# Patient Record
Sex: Female | Born: 1975 | Hispanic: Yes | Marital: Married | Smoking: Never smoker
Health system: Southern US, Community
[De-identification: ages and names within clinical notes are randomized; demographics above are authoritative.]

## PROBLEM LIST (undated history)

## (undated) DIAGNOSIS — Z923 Personal history of irradiation: Secondary | ICD-10-CM

## (undated) DIAGNOSIS — C50919 Malignant neoplasm of unspecified site of unspecified female breast: Secondary | ICD-10-CM

## (undated) DIAGNOSIS — F419 Anxiety disorder, unspecified: Secondary | ICD-10-CM

## (undated) DIAGNOSIS — Z8739 Personal history of other diseases of the musculoskeletal system and connective tissue: Secondary | ICD-10-CM

## (undated) DIAGNOSIS — Z803 Family history of malignant neoplasm of breast: Secondary | ICD-10-CM

## (undated) DIAGNOSIS — I499 Cardiac arrhythmia, unspecified: Secondary | ICD-10-CM

## (undated) DIAGNOSIS — I10 Essential (primary) hypertension: Secondary | ICD-10-CM

## (undated) DIAGNOSIS — D259 Leiomyoma of uterus, unspecified: Secondary | ICD-10-CM

## (undated) DIAGNOSIS — R112 Nausea with vomiting, unspecified: Secondary | ICD-10-CM

## (undated) DIAGNOSIS — N3010 Interstitial cystitis (chronic) without hematuria: Secondary | ICD-10-CM

## (undated) DIAGNOSIS — M797 Fibromyalgia: Secondary | ICD-10-CM

## (undated) DIAGNOSIS — K219 Gastro-esophageal reflux disease without esophagitis: Secondary | ICD-10-CM

## (undated) DIAGNOSIS — R7303 Prediabetes: Secondary | ICD-10-CM

## (undated) DIAGNOSIS — F32A Depression, unspecified: Secondary | ICD-10-CM

## (undated) DIAGNOSIS — Z9221 Personal history of antineoplastic chemotherapy: Secondary | ICD-10-CM

## (undated) DIAGNOSIS — Z8042 Family history of malignant neoplasm of prostate: Secondary | ICD-10-CM

## (undated) DIAGNOSIS — Z9889 Other specified postprocedural states: Secondary | ICD-10-CM

## (undated) DIAGNOSIS — M1990 Unspecified osteoarthritis, unspecified site: Secondary | ICD-10-CM

## (undated) DIAGNOSIS — G43909 Migraine, unspecified, not intractable, without status migrainosus: Secondary | ICD-10-CM

## (undated) DIAGNOSIS — D369 Benign neoplasm, unspecified site: Secondary | ICD-10-CM

## (undated) DIAGNOSIS — Z806 Family history of leukemia: Secondary | ICD-10-CM

## (undated) DIAGNOSIS — Z800 Family history of malignant neoplasm of digestive organs: Secondary | ICD-10-CM

## (undated) HISTORY — DX: Migraine, unspecified, not intractable, without status migrainosus: G43.909

## (undated) HISTORY — DX: Family history of malignant neoplasm of breast: Z80.3

## (undated) HISTORY — DX: Benign neoplasm, unspecified site: D36.9

## (undated) HISTORY — DX: Cardiac arrhythmia, unspecified: I49.9

## (undated) HISTORY — DX: Fibromyalgia: M79.7

## (undated) HISTORY — DX: Leiomyoma of uterus, unspecified: D25.9

## (undated) HISTORY — DX: Personal history of other diseases of the musculoskeletal system and connective tissue: Z87.39

## (undated) HISTORY — DX: Interstitial cystitis (chronic) without hematuria: N30.10

## (undated) HISTORY — DX: Gastro-esophageal reflux disease without esophagitis: K21.9

## (undated) HISTORY — DX: Family history of malignant neoplasm of digestive organs: Z80.0

## (undated) HISTORY — DX: Family history of malignant neoplasm of prostate: Z80.42

## (undated) HISTORY — DX: Depression, unspecified: F32.A

## (undated) HISTORY — DX: Family history of leukemia: Z80.6

## (undated) HISTORY — DX: Anxiety disorder, unspecified: F41.9

## (undated) HISTORY — PX: CHOLECYSTECTOMY: SHX55

## (undated) HISTORY — DX: Unspecified osteoarthritis, unspecified site: M19.90

## (undated) HISTORY — DX: Essential (primary) hypertension: I10

---

## 2020-08-20 LAB — HM MAMMOGRAPHY

## 2020-09-03 ENCOUNTER — Other Ambulatory Visit: Payer: Self-pay | Admitting: Obstetrics and Gynecology

## 2020-09-03 DIAGNOSIS — N6320 Unspecified lump in the left breast, unspecified quadrant: Secondary | ICD-10-CM

## 2020-09-23 ENCOUNTER — Other Ambulatory Visit: Payer: Self-pay | Admitting: Obstetrics and Gynecology

## 2020-09-23 ENCOUNTER — Ambulatory Visit
Admission: RE | Admit: 2020-09-23 | Discharge: 2020-09-23 | Disposition: A | Discharge status: Home / Self Care | Payer: 59 | Source: Ambulatory Visit | Attending: Obstetrics and Gynecology | Admitting: Obstetrics and Gynecology

## 2020-09-23 ENCOUNTER — Other Ambulatory Visit: Payer: Self-pay

## 2020-09-23 DIAGNOSIS — N6320 Unspecified lump in the left breast, unspecified quadrant: Secondary | ICD-10-CM

## 2020-09-23 IMAGING — US US BREAST*L* LIMITED INC AXILLA
1 series · 13 of 24 positions shown · non-contrast
Comparison: Previous exam(s).

CLINICAL DATA: 44-year-old female presenting as a recall from
screening for possible left breast mass and calcifications.

EXAM:
DIGITAL DIAGNOSTIC UNILATERAL LEFT MAMMOGRAM WITH TOMOSYNTHESIS AND
CAD; ULTRASOUND LEFT BREAST LIMITED
TECHNIQUE: Left digital diagnostic mammography and breast tomosynthesis was
performed. The images were evaluated with computer-aided detection.;
Targeted ultrasound examination of the left breast was performed

[Series 1: us breast*left* limited inc axilla · 0.06mm/px · 13 of 24 slices shown]
[im 1/24]
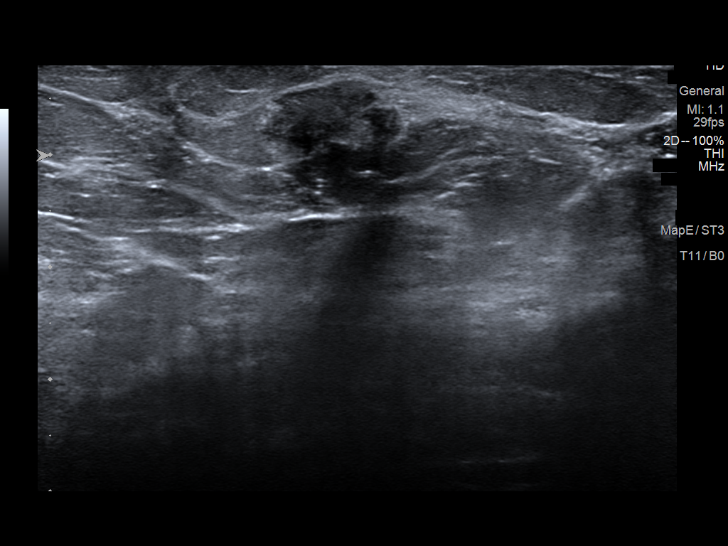
[im 3/24]
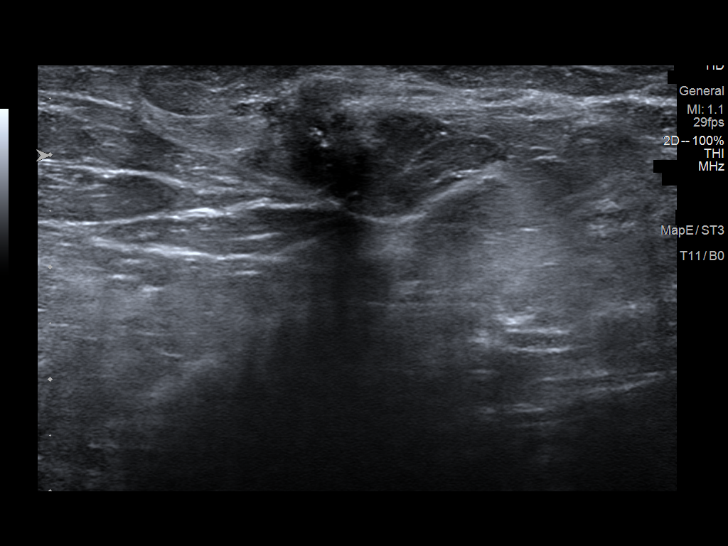
[im 5/24]
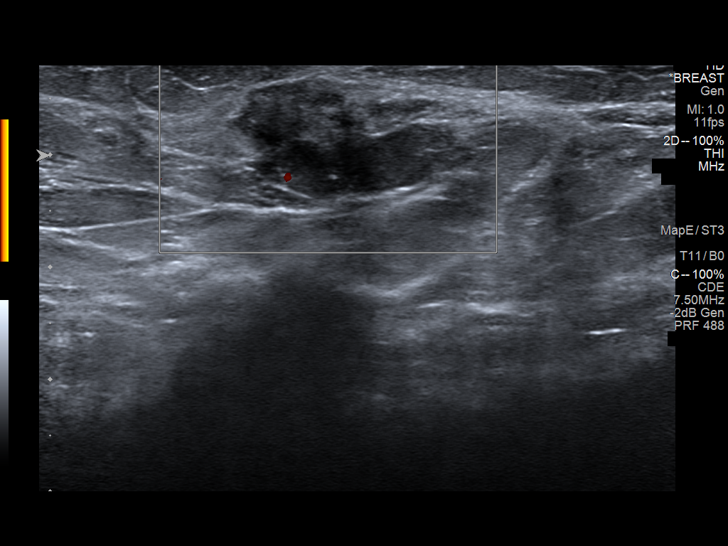
[im 7/24]
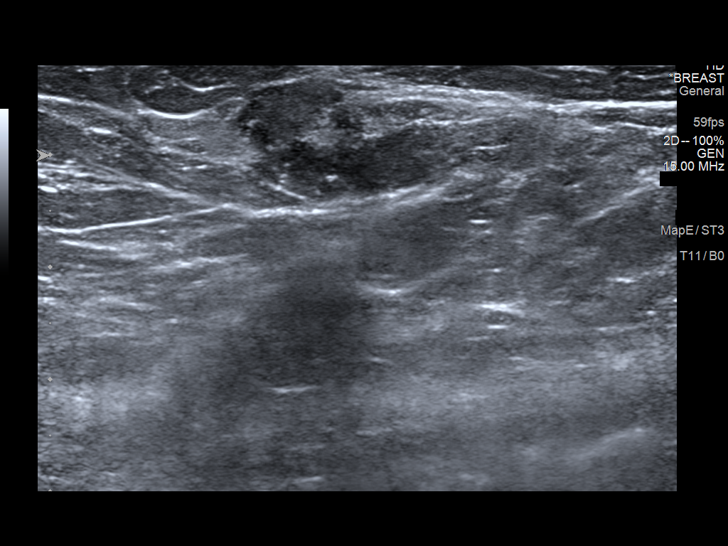
[im 9/24]
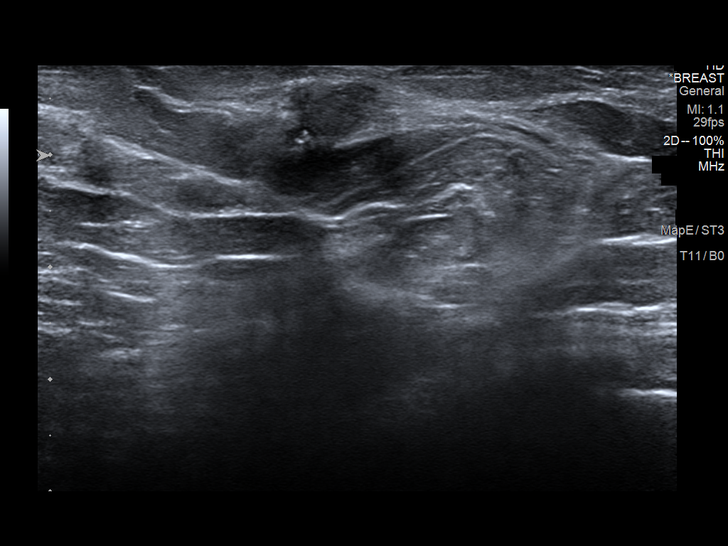
[im 11/24]
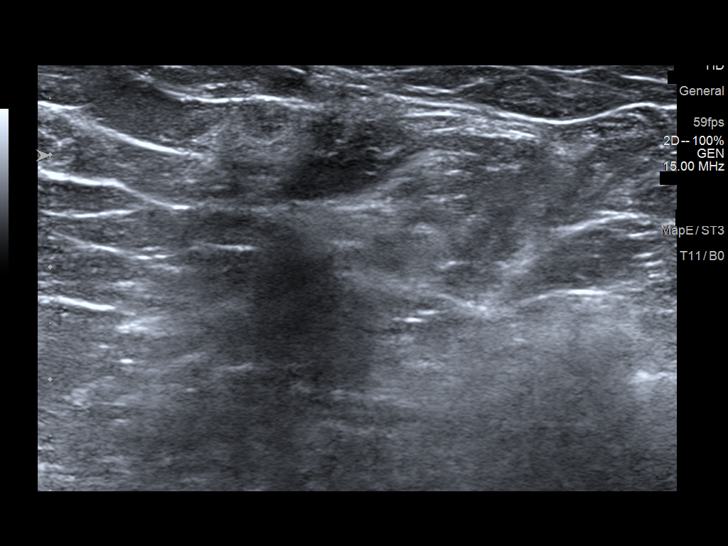
[im 13/24]
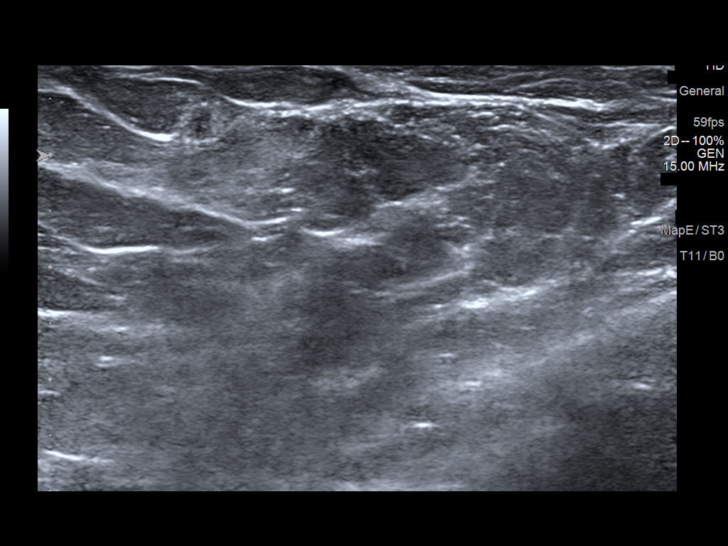
[im 14/24]
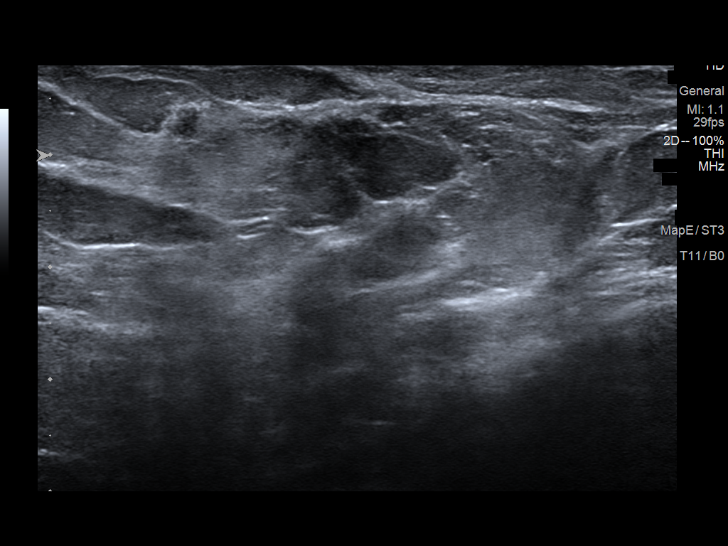
[im 16/24]
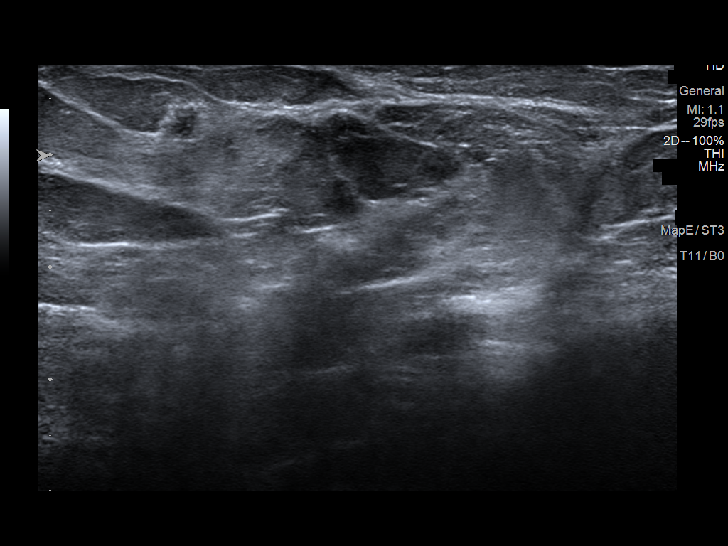
[im 18/24]
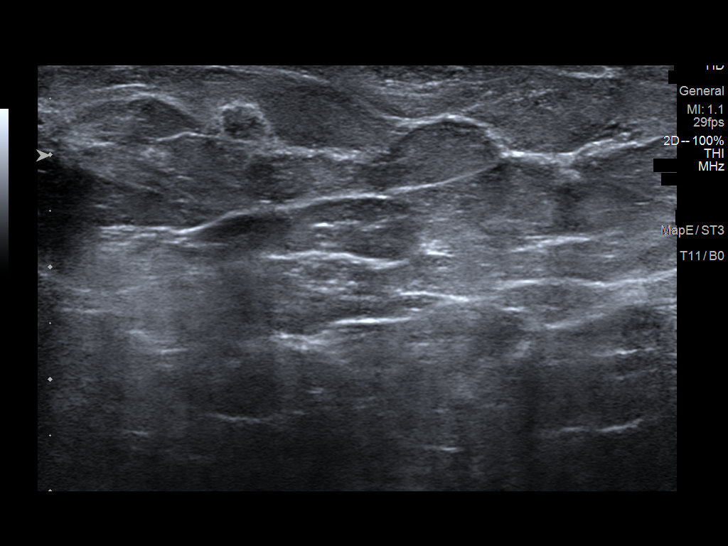
[im 20/24]
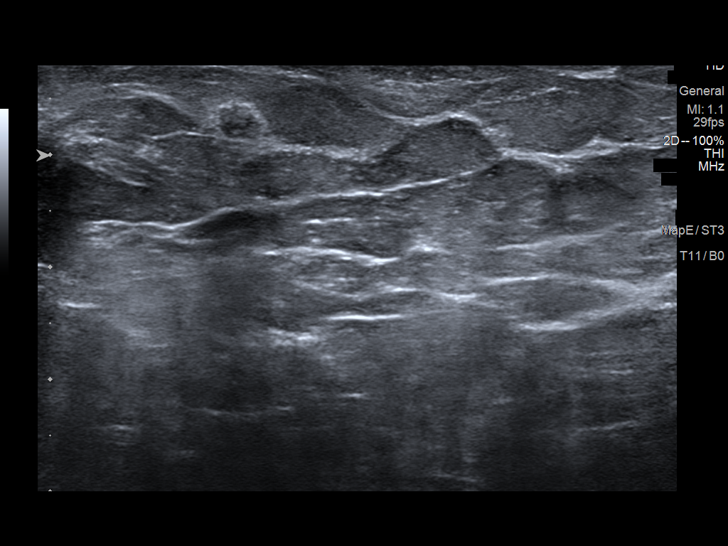
[im 22/24]
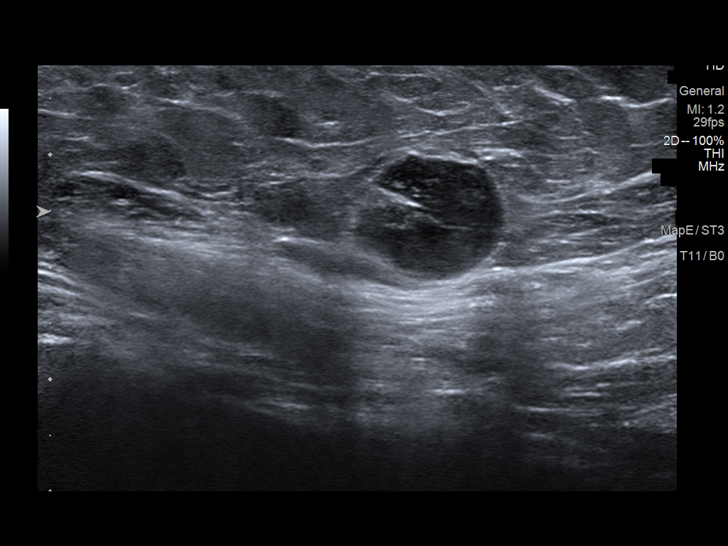
[im 24/24]
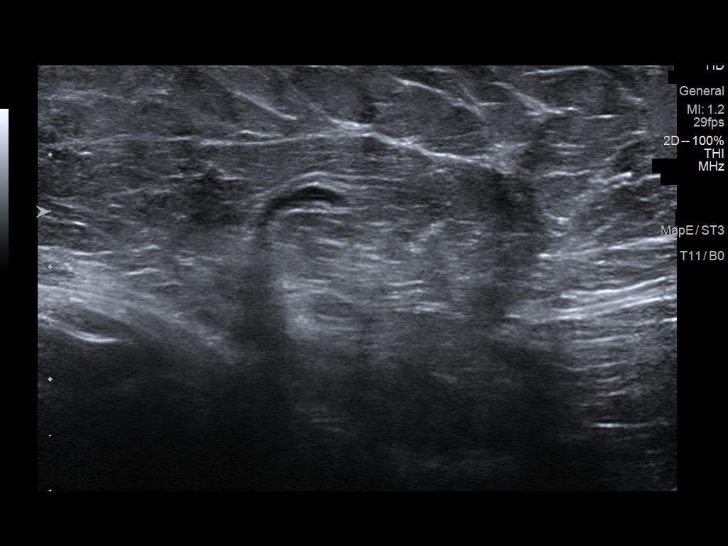

[13 of 24 positions shown; findings below may reference images not displayed]

ACR Breast Density Category b: There are scattered areas of
fibroglandular density.
FINDINGS: Mammogram:

Spot compression tomosynthesis and spot 2D magnification views of
the left breast were performed. There is an irregular mass in the
lower outer left breast measuring approximately 1.6 cm with
associated pleomorphic calcification. There is an additional smaller
irregular mass anterior to this measuring approximately 0.4 cm.
There are faint calcifications between the 2 masses overall spanning
approximately 2.3 cm.

Ultrasound:

Targeted ultrasound performed in the left breast at 5 o'clock 7 cm
from the nipple demonstrating an irregular hypoechoic mass measuring
1.7 x 1.1 x 1.3 cm. There is an additional smaller mass at 5 o'clock
7 cm from nipple measuring 0.5 x 0.4 x 0.3 cm. These correspond to
the mammographic findings.

Targeted ultrasound of the left axilla demonstrates an abnormal
lymph node with cortical thickness measuring 0.4 cm.
IMPRESSION: 1. There are two indeterminate masses in the left breast at 5
o'clock measuring 1.7 and 0.5 cm. There are suspicious
calcifications associated with an between the two masses overall
spanning 2.3 cm.

2.  Abnormal left axillary lymph node.

RECOMMENDATION:
Ultrasound-guided core needle biopsy x 3. Biopsy of both masses at 5
o'clock as well as of the abnormal left axillary lymph node.

I have discussed the findings and recommendations with the patient.
If applicable, a reminder letter will be sent to the patient
regarding the next appointment.

BI-RADS CATEGORY  4: Suspicious.

## 2020-09-23 IMAGING — MG MM DIGITAL DIAGNOSTIC UNILAT*L* W/ TOMO W/ CAD
6 series · 6 of 14 positions shown · non-contrast
Comparison: Previous exam(s).

CLINICAL DATA: 44-year-old female presenting as a recall from
screening for possible left breast mass and calcifications.

EXAM:
DIGITAL DIAGNOSTIC UNILATERAL LEFT MAMMOGRAM WITH TOMOSYNTHESIS AND
CAD; ULTRASOUND LEFT BREAST LIMITED
TECHNIQUE: Left digital diagnostic mammography and breast tomosynthesis was
performed. The images were evaluated with computer-aided detection.;
Targeted ultrasound examination of the left breast was performed

[L CC]
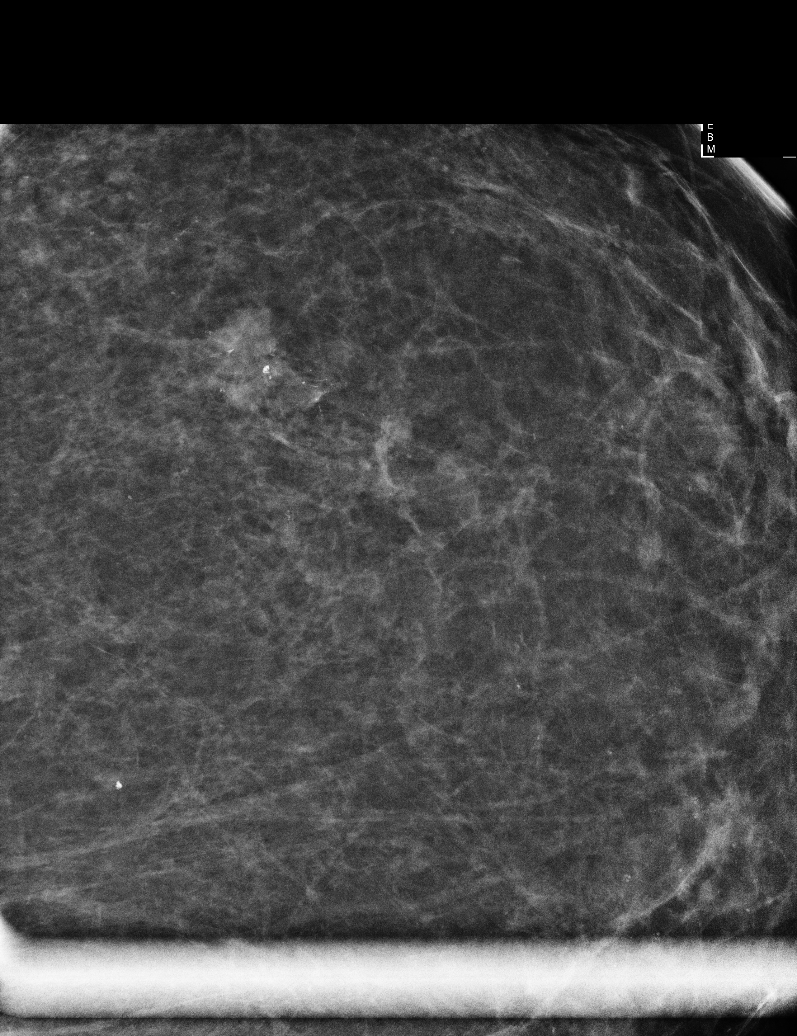

[L ML]
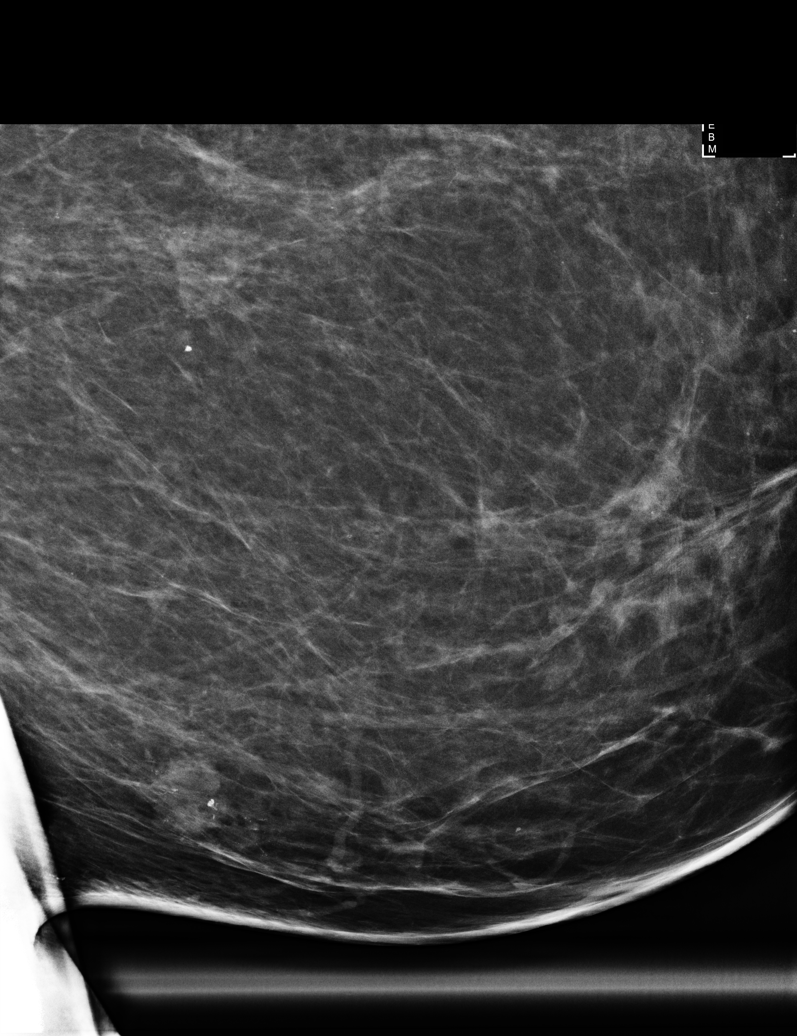

[L CC synth-2D]
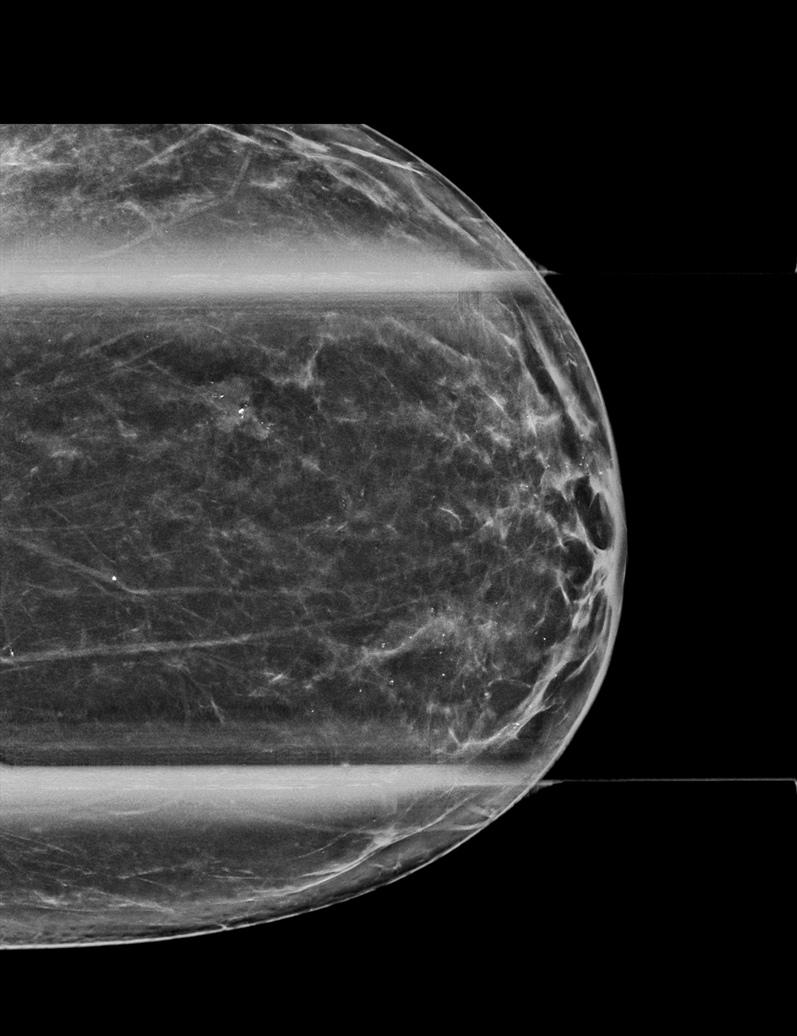

[L MLO synth-2D]
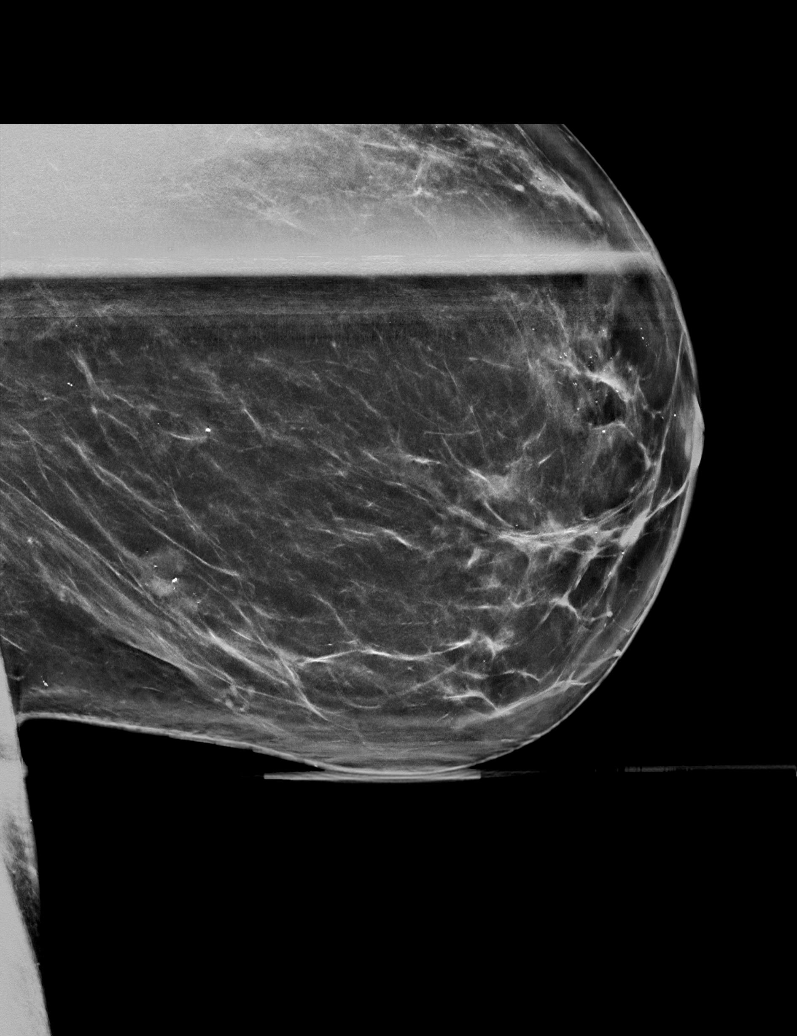

[L MLO tomo · tomo slice 41/80.0]
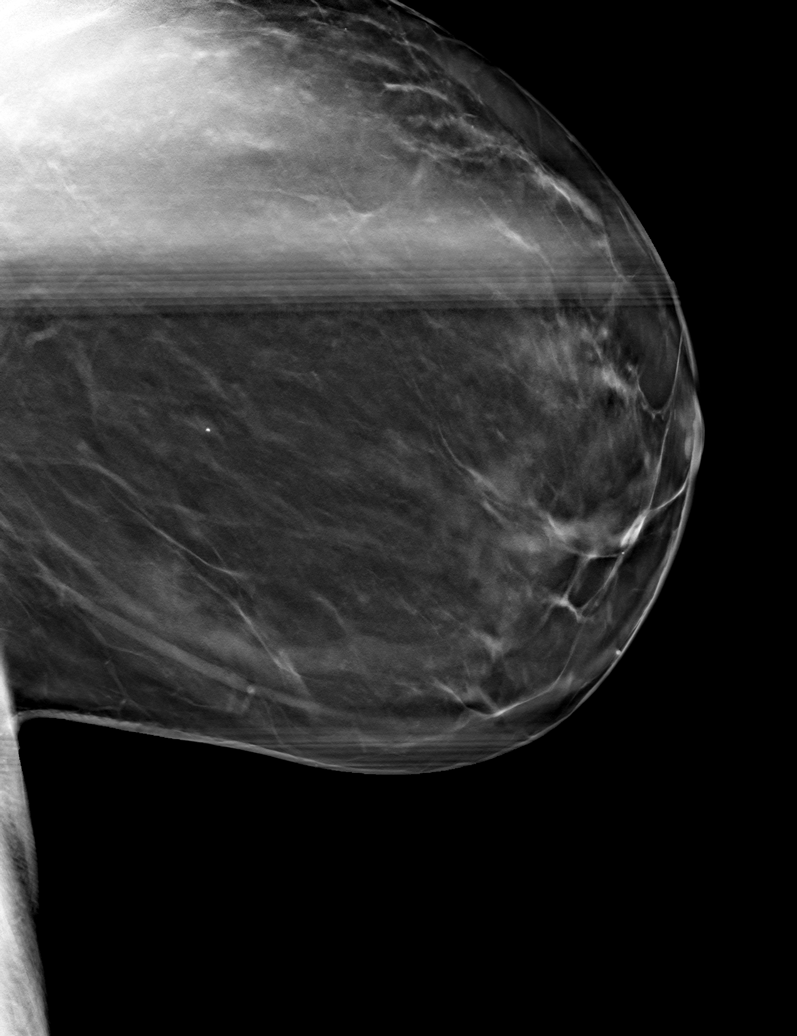

[L CC tomo · tomo slice 35/68.0]
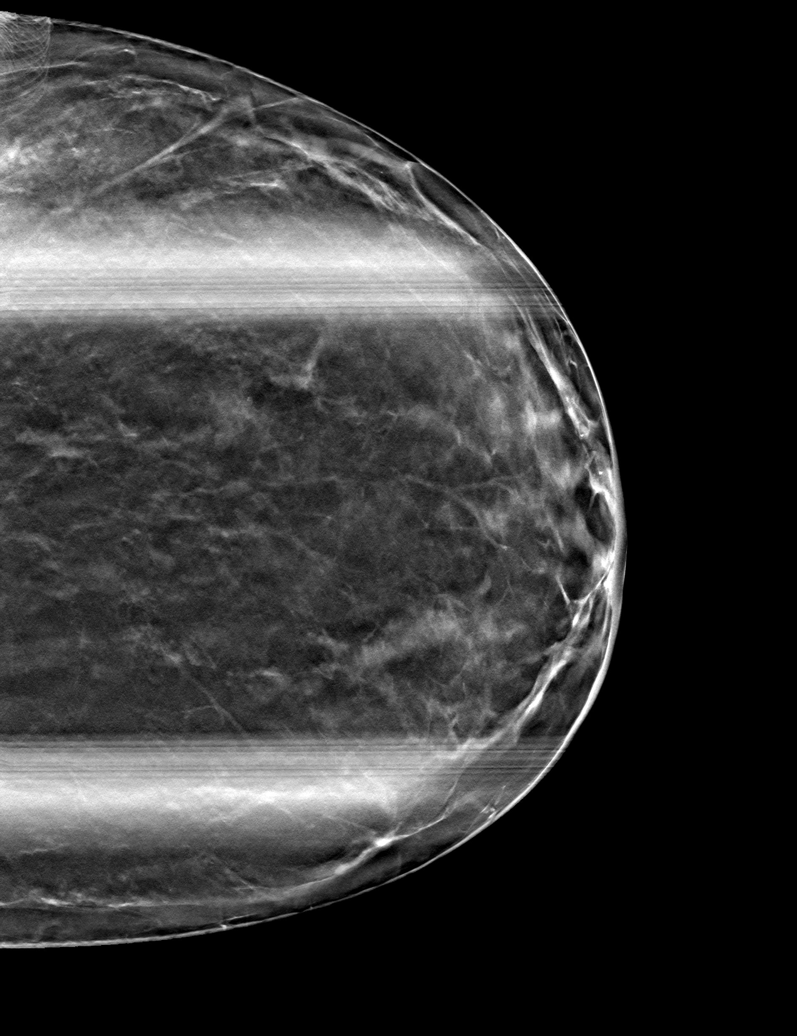

[6 of 14 positions shown; findings below may reference images not displayed]

ACR Breast Density Category b: There are scattered areas of
fibroglandular density.
FINDINGS: Mammogram:

Spot compression tomosynthesis and spot 2D magnification views of
the left breast were performed. There is an irregular mass in the
lower outer left breast measuring approximately 1.6 cm with
associated pleomorphic calcification. There is an additional smaller
irregular mass anterior to this measuring approximately 0.4 cm.
There are faint calcifications between the 2 masses overall spanning
approximately 2.3 cm.

Ultrasound:

Targeted ultrasound performed in the left breast at 5 o'clock 7 cm
from the nipple demonstrating an irregular hypoechoic mass measuring
1.7 x 1.1 x 1.3 cm. There is an additional smaller mass at 5 o'clock
7 cm from nipple measuring 0.5 x 0.4 x 0.3 cm. These correspond to
the mammographic findings.

Targeted ultrasound of the left axilla demonstrates an abnormal
lymph node with cortical thickness measuring 0.4 cm.
IMPRESSION: 1. There are two indeterminate masses in the left breast at 5
o'clock measuring 1.7 and 0.5 cm. There are suspicious
calcifications associated with an between the two masses overall
spanning 2.3 cm.

2.  Abnormal left axillary lymph node.

RECOMMENDATION:
Ultrasound-guided core needle biopsy x 3. Biopsy of both masses at 5
o'clock as well as of the abnormal left axillary lymph node.

I have discussed the findings and recommendations with the patient.
If applicable, a reminder letter will be sent to the patient
regarding the next appointment.

BI-RADS CATEGORY  4: Suspicious.

## 2020-10-01 ENCOUNTER — Ambulatory Visit
Admission: RE | Admit: 2020-10-01 | Discharge: 2020-10-01 | Disposition: A | Discharge status: Home / Self Care | Payer: 59 | Source: Ambulatory Visit | Attending: Obstetrics and Gynecology | Admitting: Obstetrics and Gynecology

## 2020-10-01 ENCOUNTER — Other Ambulatory Visit: Payer: Self-pay

## 2020-10-01 DIAGNOSIS — N6320 Unspecified lump in the left breast, unspecified quadrant: Secondary | ICD-10-CM

## 2020-10-01 IMAGING — MG MM BREAST LOCALIZATION CLIP
3 series · 3 of 3 positions shown · non-contrast
Comparison: Previous exam(s).

CLINICAL DATA: Patient status post ultrasound-guided biopsy two
adjacent left breast masses and cortically thickened left axillary
lymph node

EXAM:
DIAGNOSTIC LEFT MAMMOGRAM POST ULTRASOUND BIOPSY

[L ML]
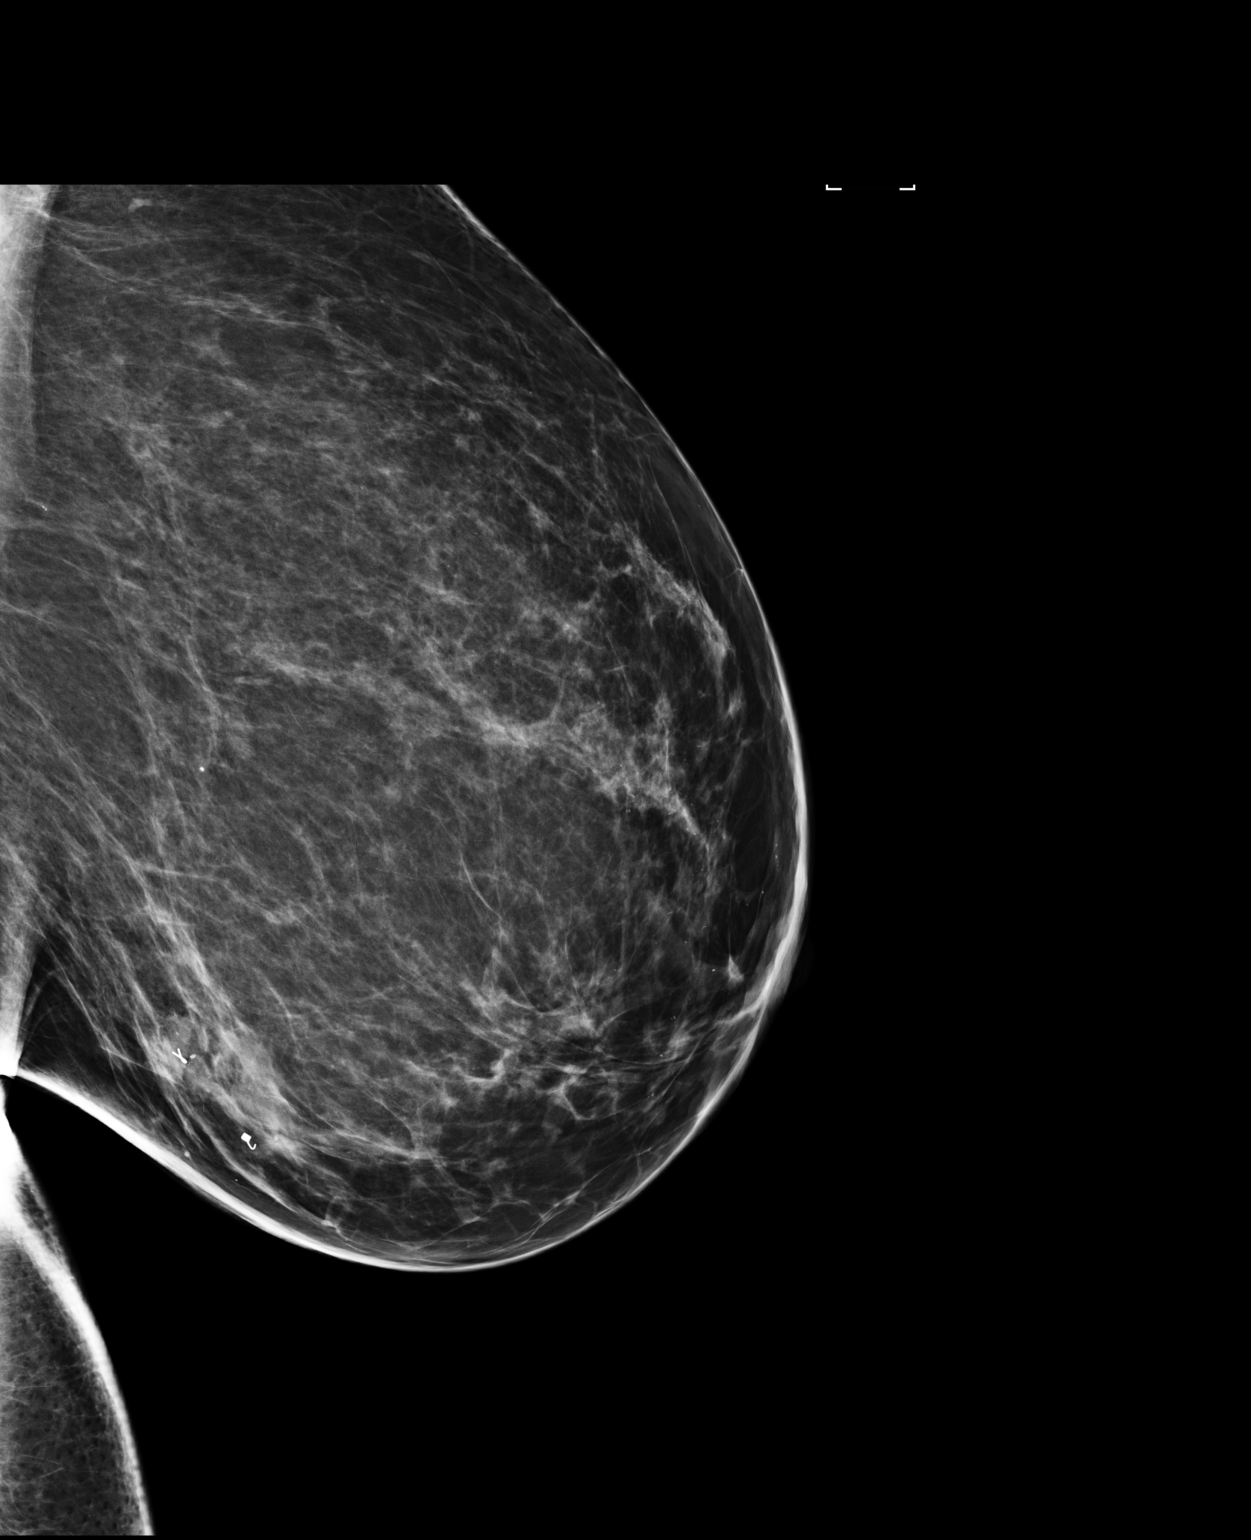

[L CC]
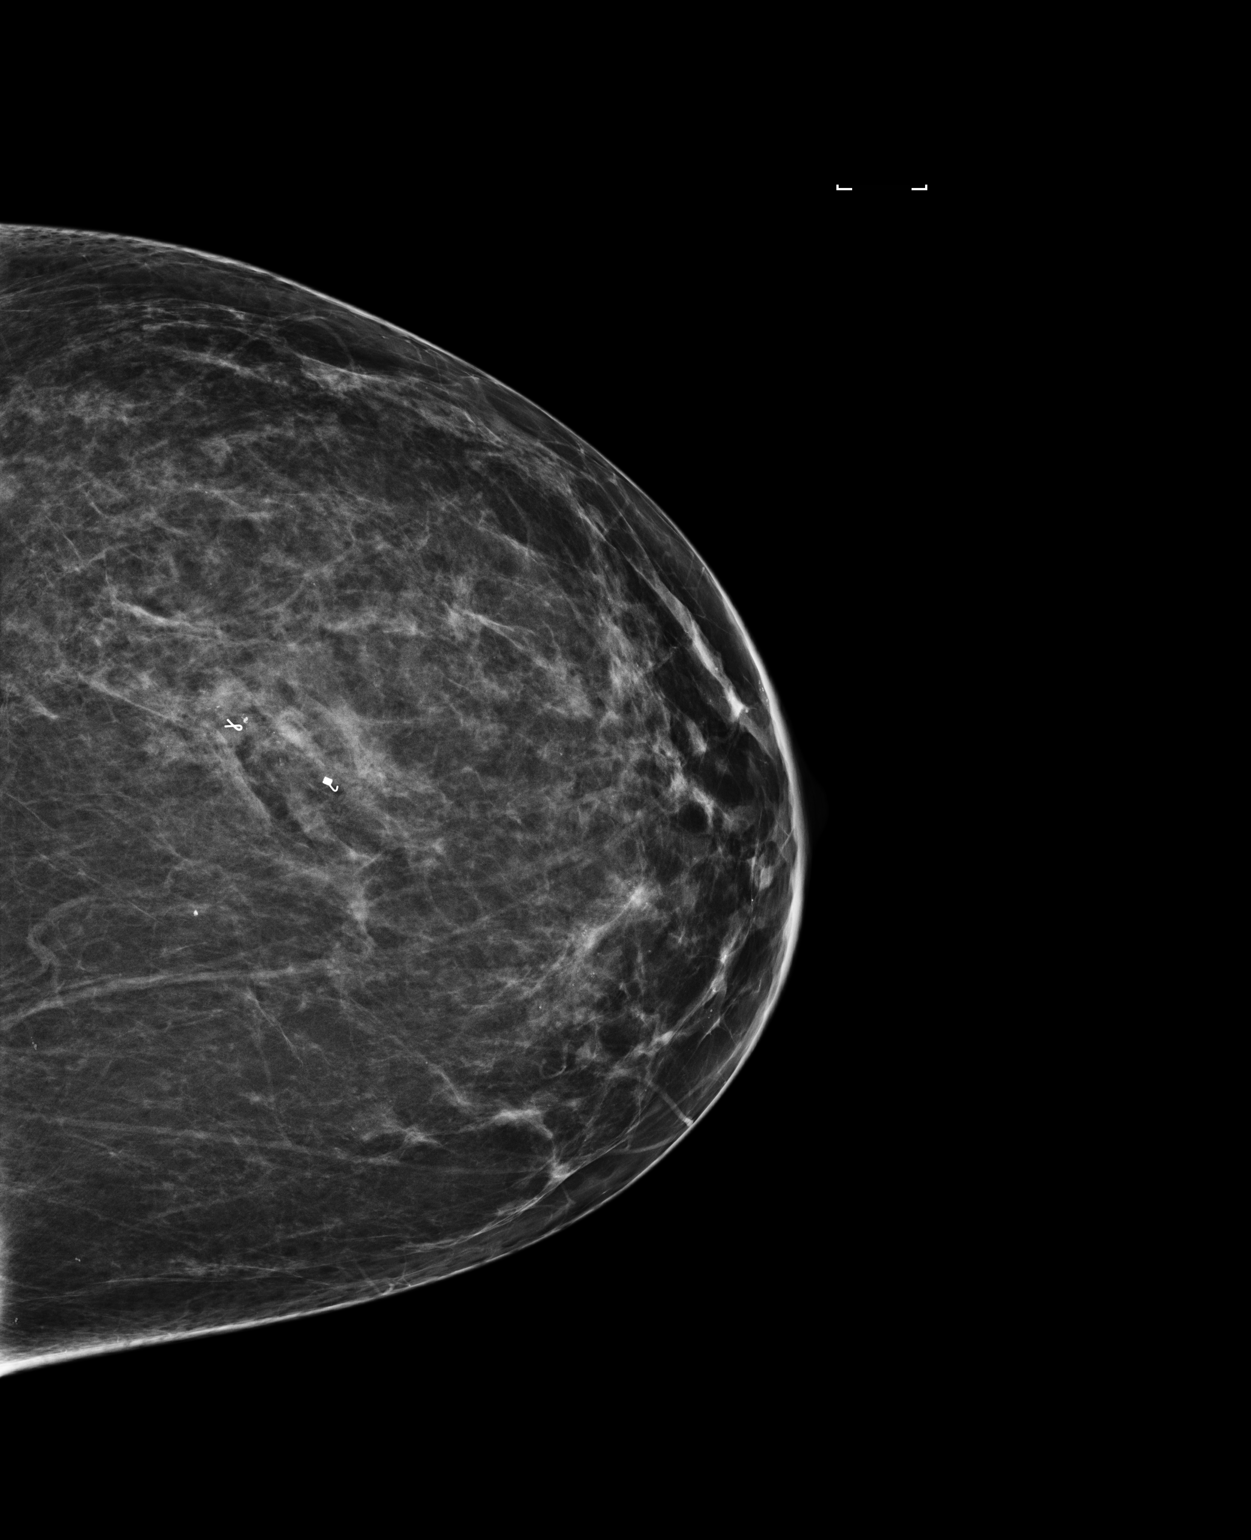

[L MLO]
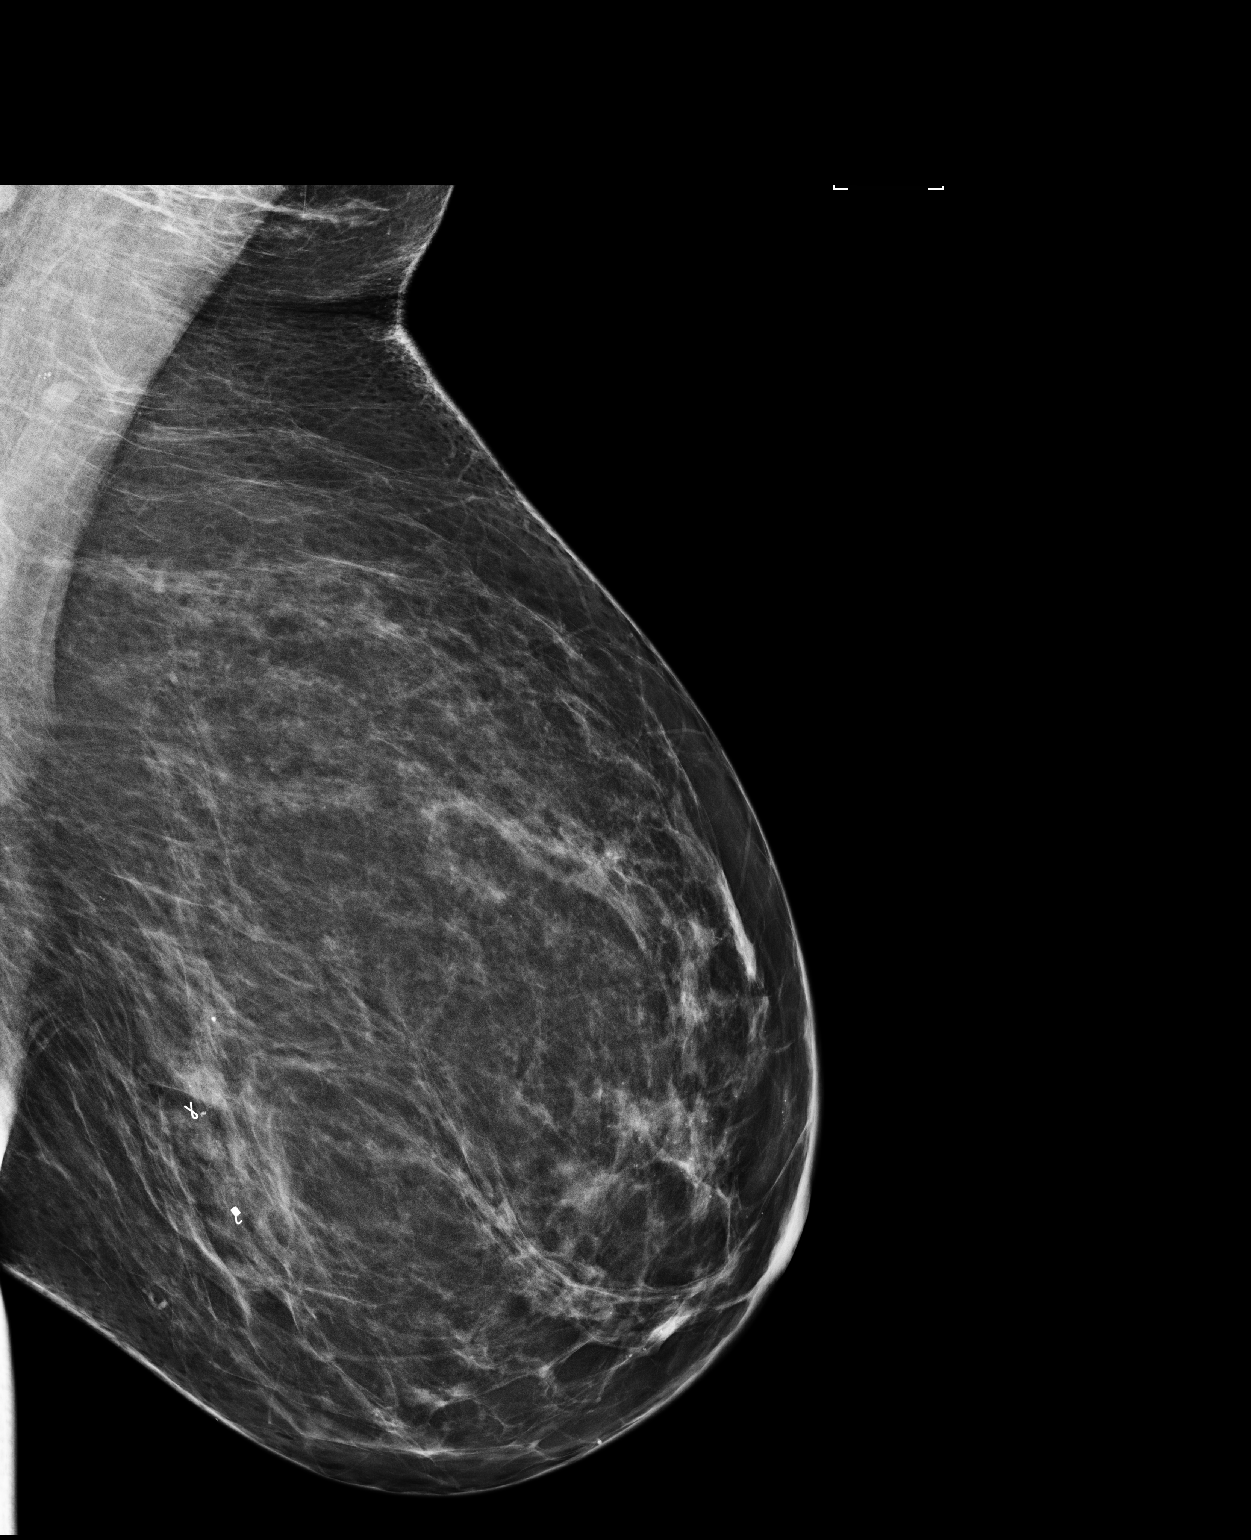

[3 of 3 positions shown; findings below may reference images not displayed]

FINDINGS: Mammographic images were obtained following ultrasound guided biopsy
of 2 adjacent breast masses and thickened left axillary lymph.

Site 1: Left breast mass 5 o'clock position posterior: Ribbon shaped
clip: In appropriate position.

Site 2: Left breast mass 5 o'clock position anterior: Coil shaped
clip: In appropriate position.

Site 3: Left axillary lymph node: Tribell clip: In appropriate
position.
IMPRESSION: Appropriate positioning of the biopsy marking clips as above.

Final Assessment: Post Procedure Mammograms for Marker Placement

## 2020-10-01 IMAGING — US US BREAST BX W LOC DEV 1ST LESION IMG BX SPEC US GUIDE*L*
1 series · 15 of 19 positions shown · non-contrast
Comparison: Previous exam(s).
COMPARISON: Previous exam(s).

Addendum:
CLINICAL DATA: Patient with two indeterminate left breast masses
and thickened left axillary lymph node.

EXAM:
ULTRASOUND GUIDED LEFT BREAST CORE NEEDLE BIOPSY
US AXILLARY NODE CORE BIOPSY LEFT

[Series 1: us breast bx w loc dev 1st lesion img bx spec us g · 0.06mm/px · 15 of 19 slices shown]
[im 1/19]
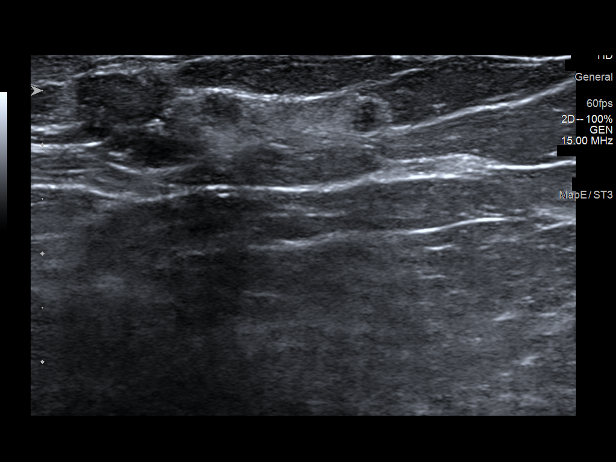
[im 2/19]
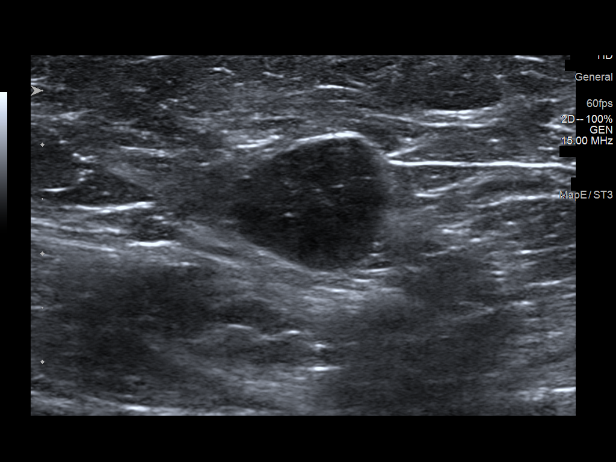
[im 4/19]
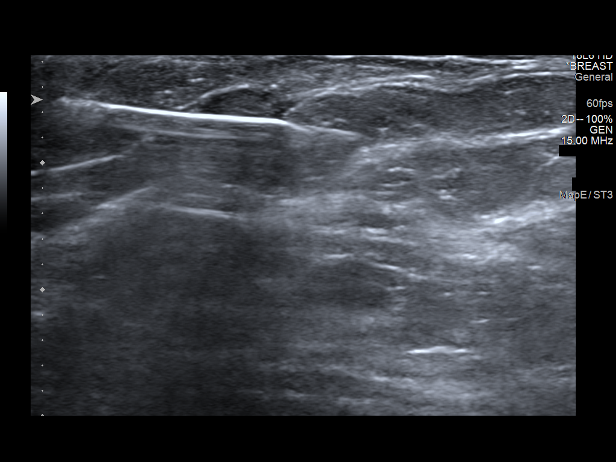
[im 5/19]
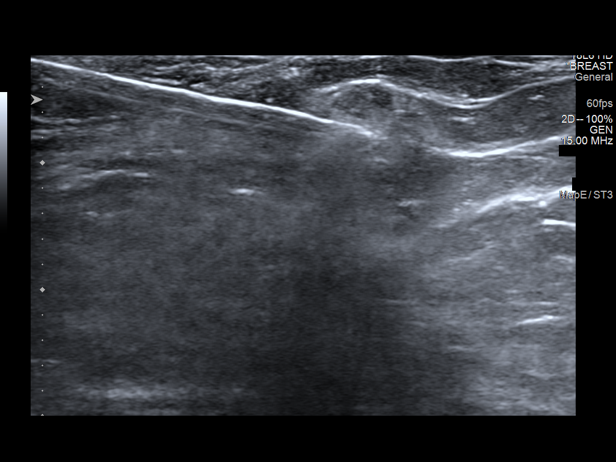
[im 6/19]
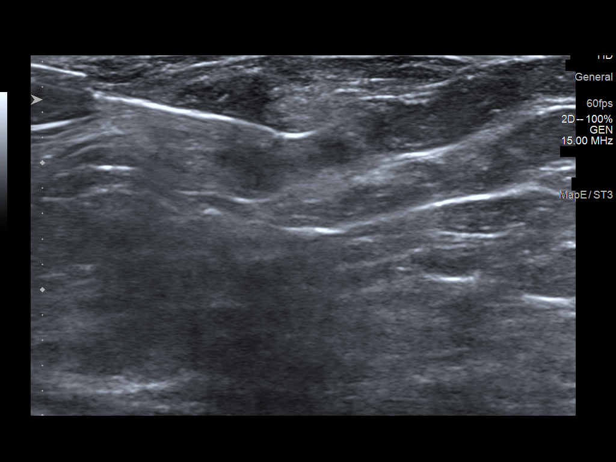
[im 7/19]
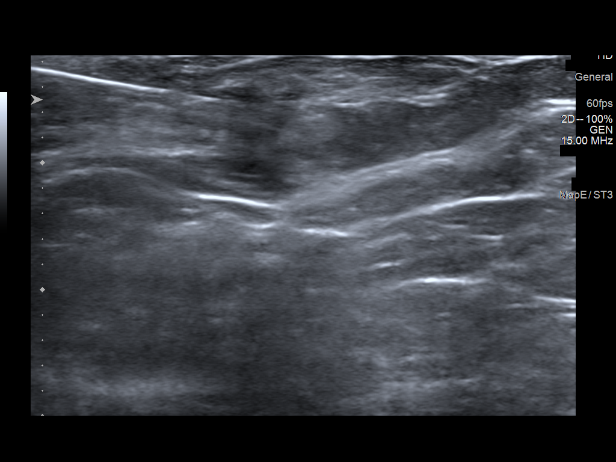
[im 9/19]
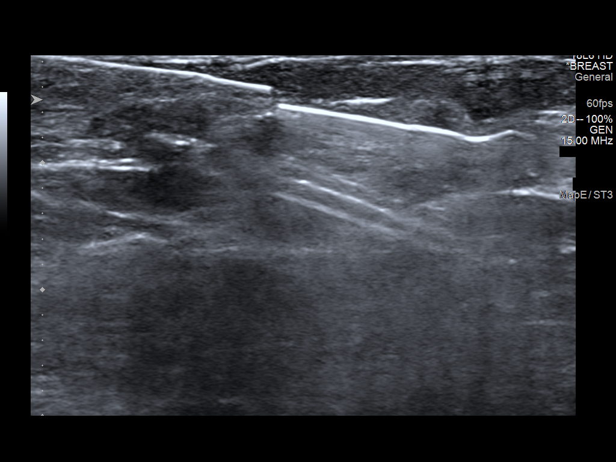
[im 10/19]
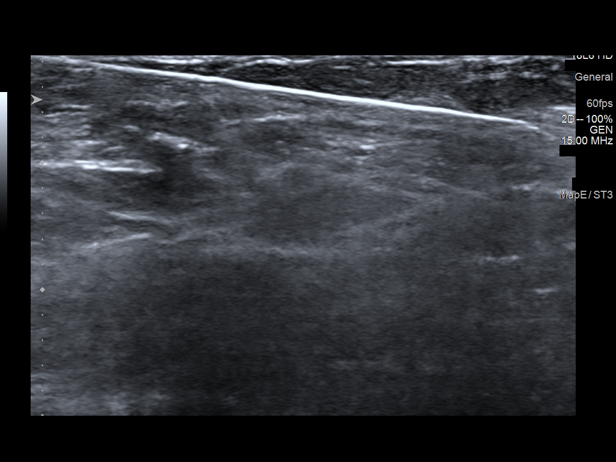
[im 11/19]
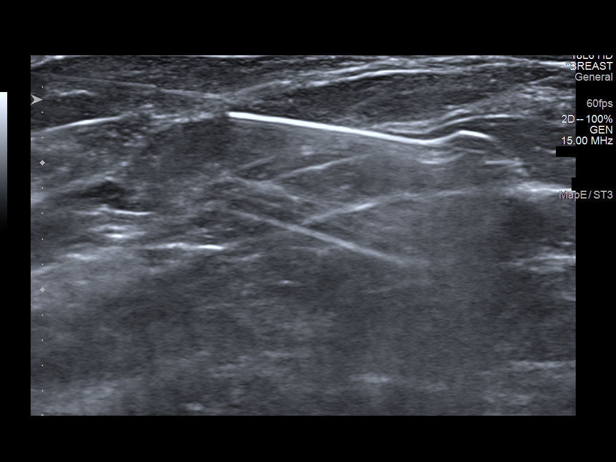
[im 13/19]
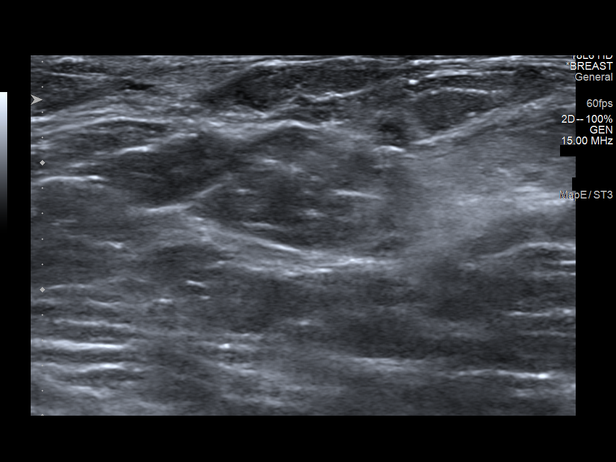
[im 14/19]
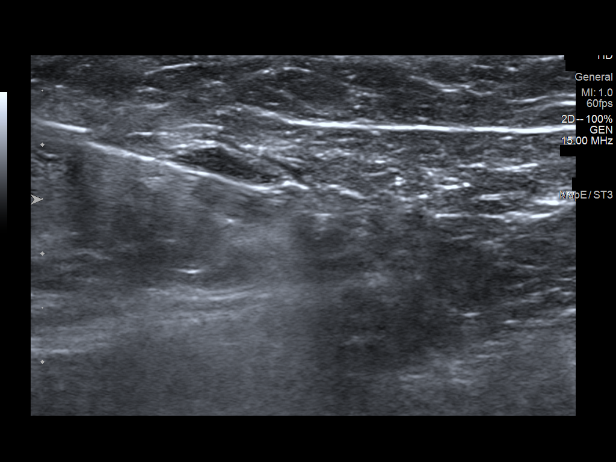
[im 15/19]
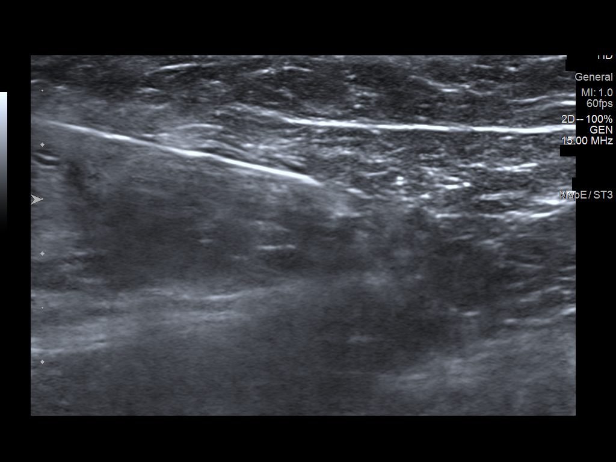
[im 16/19]
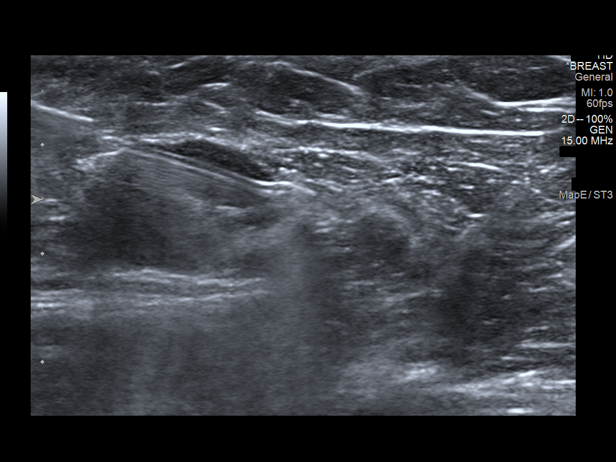
[im 18/19]
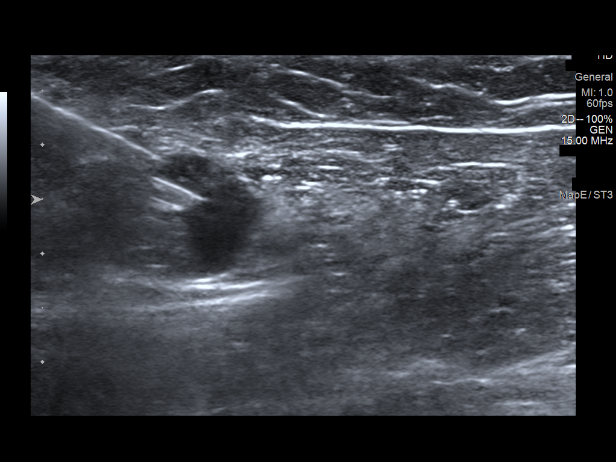
[im 19/19]
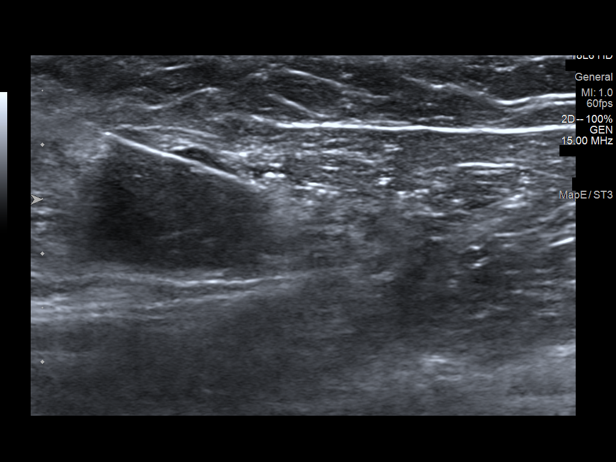

[15 of 19 positions shown; findings below may reference images not displayed]



Site 1: Left breast 5 o'clock position posterior

Lesion quadrant: Lower outer quadrant

Using sterile technique and 1% Lidocaine as local anesthetic, under
direct ultrasound visualization, a 14 gauge GARGI device was
used to perform biopsy of left breast mass 5 o'clock position
posterior using a lateral approach. At the conclusion of the
procedure ribbon shaped tissue marker clip was deployed into the
biopsy cavity. Follow up 2 view mammogram was performed and dictated
separately.

Site 2: Left breast 5 o'clock position anterior

Lesion quadrant: Lower outer quadrant

Using sterile technique and 1% Lidocaine as local anesthetic, under
direct ultrasound visualization, a 14 gauge GARGI device was
used to perform biopsy of left breast 5 o'clock position anterior
using a lateral approach. At the conclusion of the procedure coil
tissue marker clip was deployed into the biopsy cavity. Follow up 2
view mammogram was performed and dictated separately.

Site 3: Left axilla

Using sterile technique and 1% Lidocaine as local anesthetic, under
direct ultrasound visualization, a 14 gauge GARGI device was
used to perform biopsy of left axillary lymph node using a lateral
approach. At the conclusion of the procedure Tribell tissue marker
clip was deployed into the biopsy cavity. Follow up 2 view mammogram
was performed and dictated separately.
IMPRESSION: Ultrasound guided biopsy of two adjacent left breast masses and
cortically thickened left axillary lymph node. No apparent
complications.

ADDENDUM:
Pathology revealed GRADE III INVASIVE DUCTAL CARCINOMA, DUCTAL
CARCINOMA IN SITU of the Left breast, 5 o'clock, posterior. This was
found to be concordant by Dr. GARGI.

Pathology revealed GRADE III INVASIVE DUCTAL CARCINOMA, DUCTAL
CARCINOMA IN SITU of the Left breast, 5 o'clock, anterior. This was
found to be concordant by Dr. GARGI.

Pathology revealed METASTATIC CARCINOMA IN A LYMPH NODE of the Left
axilla. This was found to be concordant by Dr. GARGI.

Pathology results were discussed with the patient by telephone by
GARGI, Bilingual Patient Services Representative and
GARGI, RN Nurse Navigator. The patient reported doing well
after the biopsies with tenderness at the sites and a tender,
partially numb/tingling Left arm. The patient stated she had full
range of motion in her Left arm. Post biopsy instructions and care
were reviewed and questions were answered. The patient was
encouraged to call The [REDACTED] for any
additional concerns.

The patient was referred to [REDACTED]
[REDACTED] at [REDACTED] on
[DATE].

Pathology results reported by GARGI, RN on [DATE].



Site 1: Left breast 5 o'clock position posterior

Lesion quadrant: Lower outer quadrant

Using sterile technique and 1% Lidocaine as local anesthetic, under
direct ultrasound visualization, a 14 gauge GARGI device was
used to perform biopsy of left breast mass 5 o'clock position
posterior using a lateral approach. At the conclusion of the
procedure ribbon shaped tissue marker clip was deployed into the
biopsy cavity. Follow up 2 view mammogram was performed and dictated
separately.

Site 2: Left breast 5 o'clock position anterior

Lesion quadrant: Lower outer quadrant

Using sterile technique and 1% Lidocaine as local anesthetic, under
direct ultrasound visualization, a 14 gauge GARGI device was
used to perform biopsy of left breast 5 o'clock position anterior
using a lateral approach. At the conclusion of the procedure coil
tissue marker clip was deployed into the biopsy cavity. Follow up 2
view mammogram was performed and dictated separately.

Site 3: Left axilla

Using sterile technique and 1% Lidocaine as local anesthetic, under
direct ultrasound visualization, a 14 gauge GARGI device was
used to perform biopsy of left axillary lymph node using a lateral
approach. At the conclusion of the procedure Tribell tissue marker
clip was deployed into the biopsy cavity. Follow up 2 view mammogram
was performed and dictated separately.
IMPRESSION: Ultrasound guided biopsy of two adjacent left breast masses and
cortically thickened left axillary lymph node. No apparent
complications.

## 2020-10-03 ENCOUNTER — Telehealth: Payer: Self-pay | Admitting: Hematology and Oncology

## 2020-10-03 ENCOUNTER — Encounter: Payer: Self-pay | Admitting: *Deleted

## 2020-10-03 DIAGNOSIS — C50512 Malignant neoplasm of lower-outer quadrant of left female breast: Secondary | ICD-10-CM

## 2020-10-03 DIAGNOSIS — Z170 Estrogen receptor positive status [ER+]: Secondary | ICD-10-CM | POA: Insufficient documentation

## 2020-10-03 NOTE — Telephone Encounter (Signed)
Spoke to patient to confirm PM clinic for 4/27, packet emailed to patient

## 2020-10-08 NOTE — Progress Notes (Signed)
[place] NOTE  Patient Care Team: [name], FNP as PCP - General (Family Medicine) Rockwell Germany, RN as Oncology Nurse Navigator Mauro Kaufmann, RN as Oncology Nurse Navigator Donnie Mesa, MD as Consulting Physician (General Surgery) Nicholas Lose, MD as Consulting Physician (Hematology and Oncology) Gery Pray, MD as Consulting Physician (Radiation Oncology)  CHIEF COMPLAINTS/PURPOSE OF CONSULTATION:  Newly diagnosed breast cancer  HISTORY OF PRESENTING ILLNESS:  Tammie [name] 45 y.o. female is here because of recent diagnosis of invasive ductal carcinoma of the left breast. Screening mammogram showed a left breast mass and calcifications. Diagnostic mammogram and [place] on 09/23/20 showed a 1.7cm and 0.5cm mass at the 5 o'clock position in the left breast, with one 0.4cm abnormal right axillary lymph node. Biopsy on 10/01/20 showed invasive and in situ ductal carcinoma, grade 3, HER-2 positive (3+), ER+ 5% weak, PR+ 1%, Ki67 20%. She presents to the clinic today for initial evaluation and discussion of treatment options.   I reviewed her records extensively and collaborated the history with the patient.  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 Diagnostic mammogram and US showed a 1.7cm and 0.5cm mass at the 5 o'clock position in the left breast, with one 0.4cm abnormal right axillary lymph node. 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   10/09/2020 Cancer Staging   Staging form: Breast, AJCC 8th Edition - Clinical stage from 10/09/2020: Stage IB (cT2, cN1, cM0, G3, ER+, PR+, HER2+) - Signed by Nicholas Lose, MD on 10/09/2020 Stage prefix: Initial diagnosis Histologic  grading system: 3 grade system Percentage of positive estrogen receptors (%): 5 Percentage of positive progesterone receptors (%): 1 Ki-67 (%): 20     MEDICAL HISTORY:  Past Medical History:  Diagnosis Date  . Anxiety   . Arrhythmia   . Depression   . Fibroid, uterine   . Fibromyalgia   . Hx of degenerative disc disease   . Hypertension   . Interstitial cystitis   . Microadenoma   . Migraines     SURGICAL HISTORY: Past Surgical History:  Procedure Laterality Date  . CHOLECYSTECTOMY      SOCIAL HISTORY: Social History   Socioeconomic History  . Marital status: Unknown    Spouse name: Not on file  . Number of children: Not on file  . Years of education: Not on file  . Highest education level: Not on file  Occupational History  . Not on file  Tobacco Use  . Smoking status: Never Smoker  . Smokeless tobacco: Not on file  Substance and Sexual Activity  . Alcohol use: Never  . Drug use: Never  . Sexual activity: Not on file  Other Topics Concern  . Not on file  Social History Narrative  . Not on file   Social Determinants of Health   Financial Resource Strain: Not on file  Food Insecurity: Not on file  Transportation Needs: Not on file  Physical Activity: Not on file  Stress: Not on file  Social Connections: Not on file  Intimate Partner Violence: Not on file    FAMILY HISTORY: Family History  Problem Relation Age of Onset  . Breast cancer Mother 44  . Prostate cancer Father 99  . Stomach cancer Paternal  Grandmother   . Leukemia Paternal Grandfather 59  . Breast cancer Paternal Aunt     ALLERGIES:  has No Known Allergies.  MEDICATIONS:  Current Outpatient Medications  Medication Sig Dispense Refill  . Ascorbic Acid (VITAMIN C) 1000 MG tablet Take 1,000 mg by mouth daily.    . Cholecalciferol (DIALYVITE VITAMIN D 5000) 125 MCG (5000 UT) capsule Take 5,000 Units by mouth daily.    . citalopram (CELEXA) 20 MG tablet Take 20 mg by mouth daily.     Marland Kitchen ibuprofen (ADVIL) 800 MG tablet Take 800 mg by mouth 2 (two) times daily.    Marland Kitchen lisinopril (ZESTRIL) 20 MG tablet Take 1 tablet by mouth daily.    . magnesium gluconate (MAGONATE) 500 MG tablet Take 500 mg by mouth 2 (two) times daily.    . metoprolol tartrate (LOPRESSOR) 25 MG tablet Take 25 mg by mouth daily.    . NON FORMULARY Take 900 mg by mouth daily. Hyaluronic Acid Complex    . OVER THE COUNTER MEDICATION Take 4 capsules by mouth daily. Laminine supplement- 620 mg    . terbinafine (LAMISIL) 250 MG tablet Take 250 mg by mouth daily.     No current facility-administered medications for this visit.    REVIEW OF SYSTEMS:   Constitutional: Denies fevers, chills or abnormal night sweats Eyes: Denies blurriness of vision, double vision or watery eyes Ears, nose, mouth, throat, and face: Denies mucositis or sore throat Respiratory: Denies cough, dyspnea or wheezes Cardiovascular: Denies palpitation, chest discomfort or lower extremity swelling Gastrointestinal:  Denies nausea, heartburn or change in bowel habits Skin: Denies abnormal skin rashes Lymphatics: Denies new lymphadenopathy or easy bruising Neurological:Denies numbness, tingling or new weaknesses Behavioral/Psych: Mood is stable, no new changes  Breast: Denies any palpable lumps or discharge All other systems were reviewed with the patient and are negative.  PHYSICAL EXAMINATION: ECOG PERFORMANCE STATUS: 1 - Symptomatic but completely ambulatory  Vitals:   10/09/20 1306  BP: 128/67  Pulse: 83  Resp: 18  Temp: 97.7 F (36.5 C)  SpO2: 100%   Filed Weights   10/09/20 1306  Weight: 161 lb (73 kg)      LABORATORY DATA:  I have reviewed the data as listed Lab Results  Component Value Date   WBC 7.3 10/09/2020   HGB 12.1 10/09/2020   HCT 36.9 10/09/2020   MCV 89.3 10/09/2020   PLT 224 10/09/2020   Lab Results  Component Value Date   NA 139 10/09/2020   K 3.8 10/09/2020   CL 102 10/09/2020   CO2 27  10/09/2020    RADIOGRAPHIC STUDIES: I have personally reviewed the radiological reports and agreed with the findings in the report.  ASSESSMENT AND PLAN:  Malignant neoplasm of lower-outer quadrant of left breast of female, estrogen receptor positive ([place]) 10/01/2020:Screening mammogram showed a left breast mass and calcifications. Diagnostic mammogram and US showed a 1.7cm and 0.5cm mass at the 5 o'clock position in the left breast, with one 0.4cm abnormal right axillary lymph node. 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  Pathology and radiology counseling: Discussed with the patient, the details of pathology including the type of breast cancer,the clinical staging, the significance of ER, PR and HER-2/neu receptors and the implications for treatment. After reviewing the pathology in detail, we proceeded to discuss the different treatment options between surgery, radiation, chemotherapy, antiestrogen therapies.  Recommendation based on multidisciplinary tumor board:  1. Neoadjuvant chemotherapy with TCH Perjeta 6 cycles followed by Herceptin Perjeta maintenance versus Kadcyla maintenance (based on response to neoadjuvant chemo) for 1 year 2. Followed by breast conserving surgery if possible with sentinel lymph node study 3. Followed by adjuvant radiation therapy  4.  Followed by antiestrogen therapy although ER is weak  Chemotherapy Counseling: I discussed the risks and benefits of chemotherapy including the risks of nausea/ vomiting, risk of infection from low WBC count, fatigue due to chemo or anemia, bruising or bleeding due to low platelets, mouth sores, loss/ change in taste and decreased appetite. Liver and kidney function will be monitored through out chemotherapy as abnormalities in liver and kidney function may be a side effect of treatment. Cardiac dysfunction due to Herceptin and Perjeta and neuropathy risk from  Taxotere were discussed in detail. Risk of permanent bone marrow dysfunction due to chemo were also discussed.  Plan: 1. Port placement 2. Echocardiogram 3. Chemotherapy class 4. Breast MRI 5.  Genetics  [facility] [phone]: Treatment of refractory nausea.  After first cycle of chemo if patient experience chemo induced nausea and vomiting the randomized from cycle 2 to Aloxi plus Dex plus olanzapine or placebo plus Compazine or placebo plus placebo prior to chemo and take home medications for day 2 today for of Dex plus olanzapine or placebo and Compazine or placebo every 8 hours.  If patient does not have nausea after cycle 1, then the trial is complete.  Return to clinic in 2 weeks to start chemotherapy.  All questions were answered. The patient knows to call the clinic with any problems, questions or concerns.   Rulon Eisenmenger, MD, MPH 10/09/2020    I, Molly Dorshimer, am acting as scribe for Nicholas Lose, MD.  I have reviewed the above documentation for accuracy and completeness, and I agree with the above.

## 2020-10-09 ENCOUNTER — Inpatient Hospital Stay (HOSPITAL_BASED_OUTPATIENT_CLINIC_OR_DEPARTMENT_OTHER): Payer: 59 | Admitting: Hematology and Oncology

## 2020-10-09 ENCOUNTER — Encounter: Payer: Self-pay | Admitting: Physical Therapy

## 2020-10-09 ENCOUNTER — Ambulatory Visit
Admission: RE | Admit: 2020-10-09 | Discharge: 2020-10-09 | Disposition: A | Discharge status: Home / Self Care | Payer: 59 | Source: Ambulatory Visit | Attending: Radiation Oncology | Admitting: Radiation Oncology

## 2020-10-09 ENCOUNTER — Encounter: Payer: Self-pay | Admitting: *Deleted

## 2020-10-09 ENCOUNTER — Other Ambulatory Visit: Payer: Self-pay

## 2020-10-09 ENCOUNTER — Inpatient Hospital Stay: Payer: 59

## 2020-10-09 ENCOUNTER — Ambulatory Visit: Payer: 59 | Attending: Surgery | Admitting: Physical Therapy

## 2020-10-09 ENCOUNTER — Other Ambulatory Visit: Payer: Self-pay | Admitting: *Deleted

## 2020-10-09 ENCOUNTER — Ambulatory Visit (HOSPITAL_BASED_OUTPATIENT_CLINIC_OR_DEPARTMENT_OTHER): Payer: 59 | Admitting: Genetic Counselor

## 2020-10-09 ENCOUNTER — Ambulatory Visit: Payer: Self-pay | Admitting: Surgery

## 2020-10-09 VITALS — BP 128/67 | HR 83 | Temp 97.7°F | Resp 18 | Ht 63.0 in | Wt 161.0 lb

## 2020-10-09 DIAGNOSIS — Z803 Family history of malignant neoplasm of breast: Secondary | ICD-10-CM

## 2020-10-09 DIAGNOSIS — Z800 Family history of malignant neoplasm of digestive organs: Secondary | ICD-10-CM

## 2020-10-09 DIAGNOSIS — Z170 Estrogen receptor positive status [ER+]: Secondary | ICD-10-CM | POA: Insufficient documentation

## 2020-10-09 DIAGNOSIS — C50512 Malignant neoplasm of lower-outer quadrant of left female breast: Secondary | ICD-10-CM

## 2020-10-09 DIAGNOSIS — R293 Abnormal posture: Secondary | ICD-10-CM | POA: Insufficient documentation

## 2020-10-09 DIAGNOSIS — C773 Secondary and unspecified malignant neoplasm of axilla and upper limb lymph nodes: Secondary | ICD-10-CM | POA: Insufficient documentation

## 2020-10-09 DIAGNOSIS — Z8042 Family history of malignant neoplasm of prostate: Secondary | ICD-10-CM | POA: Diagnosis not present

## 2020-10-09 DIAGNOSIS — Z806 Family history of leukemia: Secondary | ICD-10-CM

## 2020-10-09 LAB — CBC WITH DIFFERENTIAL (CANCER CENTER ONLY)
Abs Immature Granulocytes: 0.02 10*3/uL (ref 0.00–0.07)
Basophils Absolute: 0 10*3/uL (ref 0.0–0.1)
Basophils Relative: 0 %
Eosinophils Absolute: 0.1 10*3/uL (ref 0.0–0.5)
Eosinophils Relative: 2 %
HCT: 36.9 % (ref 36.0–46.0)
Hemoglobin: 12.1 g/dL (ref 12.0–15.0)
Immature Granulocytes: 0 %
Lymphocytes Relative: 25 %
Lymphs Abs: 1.8 10*3/uL (ref 0.7–4.0)
MCH: 29.3 pg (ref 26.0–34.0)
MCHC: 32.8 g/dL (ref 30.0–36.0)
MCV: 89.3 fL (ref 80.0–100.0)
Monocytes Absolute: 0.5 10*3/uL (ref 0.1–1.0)
Monocytes Relative: 7 %
Neutro Abs: 4.8 10*3/uL (ref 1.7–7.7)
Neutrophils Relative %: 66 %
Platelet Count: 224 10*3/uL (ref 150–400)
RBC: 4.13 MIL/uL (ref 3.87–5.11)
RDW: 14 % (ref 11.5–15.5)
WBC Count: 7.3 10*3/uL (ref 4.0–10.5)
nRBC: 0 % (ref 0.0–0.2)

## 2020-10-09 LAB — CMP (CANCER CENTER ONLY)
ALT: 12 U/L (ref 0–44)
AST: 14 U/L — ABNORMAL LOW (ref 15–41)
Albumin: 4.1 g/dL (ref 3.5–5.0)
Alkaline Phosphatase: 48 U/L (ref 38–126)
Anion gap: 10 (ref 5–15)
BUN: 18 mg/dL (ref 6–20)
CO2: 27 mmol/L (ref 22–32)
Calcium: 9.3 mg/dL (ref 8.9–10.3)
Chloride: 102 mmol/L (ref 98–111)
Creatinine: 0.73 mg/dL (ref 0.44–1.00)
GFR, Estimated: >60 mL/min (ref >60)
Glucose, Bld: 106 mg/dL — ABNORMAL HIGH (ref 70–99)
Potassium: 3.8 mmol/L (ref 3.5–5.1)
Sodium: 139 mmol/L (ref 135–145)
Total Bilirubin: 0.3 mg/dL (ref 0.3–1.2)
Total Protein: 7.3 g/dL (ref 6.5–8.1)

## 2020-10-09 LAB — GENETIC SCREENING ORDER

## 2020-10-09 MED ORDER — LIDOCAINE-PRILOCAINE 2.5-2.5 % EX CREA: TOPICAL_CREAM | CUTANEOUS | 3 refills | Status: DC

## 2020-10-09 MED ORDER — DEXAMETHASONE 4 MG PO TABS: 4.0000 mg | ORAL_TABLET | Freq: Two times a day (BID) | ORAL | 0 refills | Status: DC

## 2020-10-09 MED ORDER — PROCHLORPERAZINE MALEATE 10 MG PO TABS: 10.0000 mg | ORAL_TABLET | Freq: Four times a day (QID) | ORAL | 1 refills | Status: DC | PRN

## 2020-10-09 MED ORDER — ONDANSETRON HCL 8 MG PO TABS: 8.0000 mg | ORAL_TABLET | Freq: Two times a day (BID) | ORAL | 1 refills | Status: DC | PRN

## 2020-10-09 NOTE — Progress Notes (Signed)
Radiation Oncology         [phone] ________________________________  Multidisciplinary Breast Oncology Clinic [facility]) Initial Outpatient Consultation  Name: Tammie [name] MRN: [id number]  Date: 10/09/2020  DOB: 1975-10-16  CC:Poe, Redmond Baseman, FNP  Donnie Mesa, MD   REFERRING PHYSICIAN: Donnie Mesa, MD  DIAGNOSIS: The encounter diagnosis was Malignant neoplasm of lower-outer quadrant of left breast of female, estrogen receptor positive ([place]).  Stage IB (T1c, N1) Left Breast LOQ, Invasive Ductal Carcinoma, ER+ / PR+ / Her2+, Grade 3    ICD-10-CM   1. Malignant neoplasm of lower-outer quadrant of left breast of female, estrogen receptor positive ([place])  C50.512    Z17.0     HISTORY OF PRESENT ILLNESS::Tammie [name] is a 45 y.o. female who is presenting to the office today for evaluation of her newly diagnosed breast cancer. She is accompanied by a close friend. She is doing well overall.   She had routine screening mammography on 08/20/2020 showing a possible abnormality in the left breast. She underwent left diagnostic mammography with tomography and left breast ultrasonography at [facility] on 09/23/20 showing: two indeterminate masses in left breast at 5 o'clock measuring 1.7 cm and 0.5 cm, with suspicious calcifications associated with and between the two masses overall spanning 2.3 cm; abnormal left axillary lymph node.  Biopsy on 10/01/20 showed: invasive ductal carcinoma, grade 3; DCIS. Prognostic indicators significant for: estrogen receptor, 5% positive with weak staining intensity and progesterone receptor, 1% positive with strong staining intensity. Proliferation marker Ki67 at 20%. HER2 positive.  Menarche: 45 years old GP: 0 LMP: 10/01/20 Contraceptive: used for about 1.5-2 years, 2004-2006 HRT: n/a   The patient was referred today for presentation in the multidisciplinary conference.  Radiology studies and pathology slides were presented there for  review and discussion of treatment options.  A consensus was discussed regarding potential next steps.  PREVIOUS RADIATION THERAPY: No  PAST MEDICAL HISTORY:  Past Medical History:  Diagnosis Date  . Anxiety   . Arrhythmia   . Depression   . Fibroid, uterine   . Fibromyalgia   . Hx of degenerative disc disease   . Hypertension   . Interstitial cystitis   . Microadenoma   . Migraines     PAST SURGICAL HISTORY: Past Surgical History:  Procedure Laterality Date  . CHOLECYSTECTOMY      FAMILY HISTORY:  Family History  Problem Relation Age of Onset  . Breast cancer Mother 30  . Prostate cancer Father 58  . Stomach cancer Paternal Grandmother   . Leukemia Paternal Grandfather 61  . Breast cancer Paternal Aunt     SOCIAL HISTORY:  Social History   Socioeconomic History  . Marital status: Unknown    Spouse name: Not on file  . Number of children: Not on file  . Years of education: Not on file  . Highest education level: Not on file  Occupational History  . Not on file  Tobacco Use  . Smoking status: Never Smoker  . Smokeless tobacco: Not on file  Substance and Sexual Activity  . Alcohol use: Never  . Drug use: Never  . Sexual activity: Not on file  Other Topics Concern  . Not on file  Social History Narrative  . Not on file   Social Determinants of Health   Financial Resource Strain: Not on file  Food Insecurity: Not on file  Transportation Needs: Not on file  Physical Activity: Not on file  Stress: Not on file  Social Connections: Not on file    ALLERGIES: No Known Allergies  MEDICATIONS:  Current Outpatient Medications  Medication Sig Dispense Refill  . Ascorbic Acid (VITAMIN C) 1000 MG tablet Take 1,000 mg by mouth daily.    . Cholecalciferol (DIALYVITE VITAMIN D 5000) 125 MCG (5000 UT) capsule Take 5,000 Units by mouth daily.    . citalopram (CELEXA) 20 MG tablet Take 20 mg by mouth daily.    Marland Kitchen dexamethasone (DECADRON) 4 MG tablet Take 1 tablet  (4 mg total) by mouth 2 (two) times daily. Take 1 tablet day before chemo and 1 tablet day after chemo with food 12 tablet 0  . ibuprofen (ADVIL) 800 MG tablet Take 800 mg by mouth 2 (two) times daily.    Marland Kitchen lidocaine-prilocaine (EMLA) cream Apply to affected area once 30 g 3  . lisinopril (ZESTRIL) 20 MG tablet Take 1 tablet by mouth daily.    . magnesium gluconate (MAGONATE) 500 MG tablet Take 500 mg by mouth 2 (two) times daily.    . metoprolol tartrate (LOPRESSOR) 25 MG tablet Take 25 mg by mouth daily.    . NON FORMULARY Take 900 mg by mouth daily. Hyaluronic Acid Complex    . ondansetron (ZOFRAN) 8 MG tablet Take 1 tablet (8 mg total) by mouth 2 (two) times daily as needed (Nausea or vomiting). Start on the third day after chemotherapy. 30 tablet 1  . OVER THE COUNTER MEDICATION Take 4 capsules by mouth daily. Laminine supplement- 620 mg    . prochlorperazine (COMPAZINE) 10 MG tablet Take 1 tablet (10 mg total) by mouth every 6 (six) hours as needed (Nausea or vomiting). 30 tablet 1  . terbinafine (LAMISIL) 250 MG tablet Take 250 mg by mouth daily.     No current facility-administered medications for this encounter.    REVIEW OF SYSTEMS: A 10+ POINT REVIEW OF SYSTEMS WAS OBTAINED including neurology, dermatology, psychiatry, cardiac, respiratory, lymph, extremities, GI, GU, musculoskeletal, constitutional, reproductive, HEENT. On the provided form, she reports loss of sleep, fatigue that affects activities, left arm pain, wearing glasses, runny nose and sinus issues, irregular heart beat and chest pain, nausea, change in stool habits with rectal bleeding, incontinence, breast pain and rash, back pain, headaches, weakness, forgetfulness, anxiety and depression, and swollen lymph glands. She denies any other symptoms.    PHYSICAL EXAM:  Vitals with BMI 10/09/2020  Weight 563 lbs  Systolic 875  Diastolic 67  Pulse 83  Lungs are clear to auscultation bilaterally. Heart has regular rate and  rhythm. No palpable cervical, supraclavicular, or axillary adenopathy. Abdomen soft, non-tender, normal bowel sounds. Breast: Right breast large and pendulous with no palpable mass, nipple discharge, or bleeding. Left breast large and pendulous with some bruising in LOQ at biopsy site, no palpable mass, nipple discharge, or bleeding.   KPS = 90  100 - Normal; no complaints; no evidence of disease. 90   - Able to carry on normal activity; minor signs or symptoms of disease. 80   - Normal activity with effort; some signs or symptoms of disease. 83   - Cares for self; unable to carry on normal activity or to do active work. 60   - Requires occasional assistance, but is able to care for most of his personal needs. 50   - Requires considerable assistance and frequent medical care. 49   - Disabled; requires special care and assistance. 59   - Severely disabled; hospital admission is indicated although death not imminent. 20   -  Very sick; hospital admission necessary; active supportive treatment necessary. 10   - Moribund; fatal processes progressing rapidly. 0     - Dead  Karnofsky DA, Abelmann [place], Craver LS and Burchenal JH [phone]) The use of the nitrogen mustards in the palliative treatment of carcinoma: with particular reference to bronchogenic carcinoma Cancer 1 634-56  LABORATORY DATA:  Lab Results  Component Value Date   WBC 7.3 10/09/2020   HGB 12.1 10/09/2020   HCT 36.9 10/09/2020   MCV 89.3 10/09/2020   PLT 224 10/09/2020   Lab Results  Component Value Date   NA 139 10/09/2020   K 3.8 10/09/2020   CL 102 10/09/2020   CO2 27 10/09/2020   Lab Results  Component Value Date   ALT 12 10/09/2020   AST 14 (L) 10/09/2020   ALKPHOS 48 10/09/2020   BILITOT 0.3 10/09/2020    PULMONARY FUNCTION TEST:   Recent Review Flowsheet Data   There is no flowsheet data to display.     RADIOGRAPHY: US BREAST LTD UNI LEFT INC AXILLA  Result Date: 09/23/2020 CLINICAL DATA:  45 year old  female presenting as a recall from screening for possible left breast mass and calcifications. EXAM: DIGITAL DIAGNOSTIC UNILATERAL LEFT MAMMOGRAM WITH TOMOSYNTHESIS AND CAD; ULTRASOUND LEFT BREAST LIMITED TECHNIQUE: Left digital diagnostic mammography and breast tomosynthesis was performed. The images were evaluated with computer-aided detection.; Targeted ultrasound examination of the left breast was performed COMPARISON:  Previous exam(s). ACR Breast Density Category b: There are scattered areas of fibroglandular density. FINDINGS: Mammogram: Spot compression tomosynthesis and spot 2D magnification views of the left breast were performed. There is an irregular mass in the lower outer left breast measuring approximately 1.6 cm with associated pleomorphic calcification. There is an additional smaller irregular mass anterior to this measuring approximately 0.4 cm. There are faint calcifications between the 2 masses overall spanning approximately 2.3 cm. Ultrasound: Targeted ultrasound performed in the left breast at 5 o'clock 7 cm from the nipple demonstrating an irregular hypoechoic mass measuring 1.7 x 1.1 x 1.3 cm. There is an additional smaller mass at 5 o'clock 7 cm from nipple measuring 0.5 x 0.4 x 0.3 cm. These correspond to the mammographic findings. Targeted ultrasound of the left axilla demonstrates an abnormal lymph node with cortical thickness measuring 0.4 cm. IMPRESSION: 1. There are two indeterminate masses in the left breast at 5 o'clock measuring 1.7 and 0.5 cm. There are suspicious calcifications associated with an between the two masses overall spanning 2.3 cm. 2.  Abnormal left axillary lymph node. RECOMMENDATION: Ultrasound-guided core needle biopsy x 3. Biopsy of both masses at 5 o'clock as well as of the abnormal left axillary lymph node. I have discussed the findings and recommendations with the patient. If applicable, a reminder letter will be sent to the patient regarding the next  appointment. BI-RADS CATEGORY  4: Suspicious. Electronically Signed   By: Audie Pinto M.D.   On: 09/23/2020 12:23   MM DIAG BREAST TOMO UNI LEFT  Result Date: 09/23/2020 CLINICAL DATA:  45 year old female presenting as a recall from screening for possible left breast mass and calcifications. EXAM: DIGITAL DIAGNOSTIC UNILATERAL LEFT MAMMOGRAM WITH TOMOSYNTHESIS AND CAD; ULTRASOUND LEFT BREAST LIMITED TECHNIQUE: Left digital diagnostic mammography and breast tomosynthesis was performed. The images were evaluated with computer-aided detection.; Targeted ultrasound examination of the left breast was performed COMPARISON:  Previous exam(s). ACR Breast Density Category b: There are scattered areas of fibroglandular density. FINDINGS: Mammogram: Spot compression tomosynthesis and spot 2D magnification views  of the left breast were performed. There is an irregular mass in the lower outer left breast measuring approximately 1.6 cm with associated pleomorphic calcification. There is an additional smaller irregular mass anterior to this measuring approximately 0.4 cm. There are faint calcifications between the 2 masses overall spanning approximately 2.3 cm. Ultrasound: Targeted ultrasound performed in the left breast at 5 o'clock 7 cm from the nipple demonstrating an irregular hypoechoic mass measuring 1.7 x 1.1 x 1.3 cm. There is an additional smaller mass at 5 o'clock 7 cm from nipple measuring 0.5 x 0.4 x 0.3 cm. These correspond to the mammographic findings. Targeted ultrasound of the left axilla demonstrates an abnormal lymph node with cortical thickness measuring 0.4 cm. IMPRESSION: 1. There are two indeterminate masses in the left breast at 5 o'clock measuring 1.7 and 0.5 cm. There are suspicious calcifications associated with an between the two masses overall spanning 2.3 cm. 2.  Abnormal left axillary lymph node. RECOMMENDATION: Ultrasound-guided core needle biopsy x 3. Biopsy of both masses at 5 o'clock as  well as of the abnormal left axillary lymph node. I have discussed the findings and recommendations with the patient. If applicable, a reminder letter will be sent to the patient regarding the next appointment. BI-RADS CATEGORY  4: Suspicious. Electronically Signed   By: Audie Pinto M.D.   On: 09/23/2020 12:23   [place] AXILLARY NODE CORE BIOPSY LEFT  Addendum Date: 10/03/2020   ADDENDUM REPORT: 10/02/2020 13:52 ADDENDUM: Pathology revealed GRADE III INVASIVE DUCTAL CARCINOMA, DUCTAL CARCINOMA IN SITU of the Left breast, 5 o'clock, posterior. This was found to be concordant by Dr. Lovey Newcomer. Pathology revealed GRADE III INVASIVE DUCTAL CARCINOMA, DUCTAL CARCINOMA IN SITU of the Left breast, 5 o'clock, anterior. This was found to be concordant by Dr. Lovey Newcomer. Pathology revealed METASTATIC CARCINOMA IN A LYMPH NODE of the Left axilla. This was found to be concordant by Dr. Lovey Newcomer. Pathology results were discussed with the patient by telephone by Kathrine Haddock, Bilingual Patient Services Representative and Stacie Acres, RN Nurse Navigator. The patient reported doing well after the biopsies with tenderness at the sites and a tender, partially numb/tingling Left arm. The patient stated she had full range of motion in her Left arm. Post biopsy instructions and care were reviewed and questions were answered. The patient was encouraged to call [facility] for any additional concerns. The patient was referred to [facility] on October 09, 2020. Pathology results reported by Terie Purser, RN on 10/02/2020. Electronically Signed   By: Lovey Newcomer M.D.   On: 10/02/2020 13:52   Result Date: 10/03/2020 CLINICAL DATA:  Patient with two indeterminate left breast masses and thickened left axillary lymph node. EXAM: ULTRASOUND GUIDED LEFT BREAST CORE NEEDLE BIOPSY [place] AXILLARY NODE CORE BIOPSY LEFT COMPARISON:   Previous exam(s). PROCEDURE: I met with the patient and we discussed the procedure of ultrasound-guided biopsy, including benefits and alternatives. We discussed the high likelihood of a successful procedure. We discussed the risks of the procedure, including infection, bleeding, tissue injury, clip migration, and inadequate sampling. Informed written consent was given. The usual time-out protocol was performed immediately prior to the procedure. Site 1: Left breast 5 o'clock position posterior Lesion quadrant: Lower outer quadrant Using sterile technique and 1% Lidocaine as local anesthetic, under direct ultrasound visualization, a 14 gauge spring-loaded device was used to perform biopsy of left breast mass 5 o'clock position posterior  using a lateral approach. At the conclusion of the procedure ribbon shaped tissue marker clip was deployed into the biopsy cavity. Follow up 2 view mammogram was performed and dictated separately. Site 2: Left breast 5 o'clock position anterior Lesion quadrant: Lower outer quadrant Using sterile technique and 1% Lidocaine as local anesthetic, under direct ultrasound visualization, a 14 gauge spring-loaded device was used to perform biopsy of left breast 5 o'clock position anterior using a lateral approach. At the conclusion of the procedure coil tissue marker clip was deployed into the biopsy cavity. Follow up 2 view mammogram was performed and dictated separately. Site 3: Left axilla Using sterile technique and 1% Lidocaine as local anesthetic, under direct ultrasound visualization, a 14 gauge spring-loaded device was used to perform biopsy of left axillary lymph node using a lateral approach. At the conclusion of the procedure Tribell tissue marker clip was deployed into the biopsy cavity. Follow up 2 view mammogram was performed and dictated separately. IMPRESSION: Ultrasound guided biopsy of two adjacent left breast masses and cortically thickened left axillary lymph node. No  apparent complications. Electronically Signed: By: Lovey Newcomer M.D. On: 10/01/2020 09:02   MM CLIP PLACEMENT LEFT  Result Date: 10/01/2020 CLINICAL DATA:  Patient status post ultrasound-guided biopsy two adjacent left breast masses and cortically thickened left axillary lymph node EXAM: DIAGNOSTIC LEFT MAMMOGRAM POST ULTRASOUND BIOPSY COMPARISON:  Previous exam(s). FINDINGS: Mammographic images were obtained following ultrasound guided biopsy of 2 adjacent breast masses and thickened left axillary lymph. Site 1: Left breast mass 5 o'clock position posterior: Ribbon shaped clip: In appropriate position. Site 2: Left breast mass 5 o'clock position anterior: Coil shaped clip: In appropriate position. Site 3: Left axillary lymph node: Tribell clip: In appropriate position. IMPRESSION: Appropriate positioning of the biopsy marking clips as above. Final Assessment: Post Procedure Mammograms for Marker Placement Electronically Signed   By: Lovey Newcomer M.D.   On: 10/01/2020 09:04   [place] LT BREAST BX W LOC DEV 1ST LESION IMG BX SPEC US GUIDE  Addendum Date: 10/03/2020   ADDENDUM REPORT: 10/02/2020 13:52 ADDENDUM: Pathology revealed GRADE III INVASIVE DUCTAL CARCINOMA, DUCTAL CARCINOMA IN SITU of the Left breast, 5 o'clock, posterior. This was found to be concordant by Dr. Lovey Newcomer. Pathology revealed GRADE III INVASIVE DUCTAL CARCINOMA, DUCTAL CARCINOMA IN SITU of the Left breast, 5 o'clock, anterior. This was found to be concordant by Dr. Lovey Newcomer. Pathology revealed METASTATIC CARCINOMA IN A LYMPH NODE of the Left axilla. This was found to be concordant by Dr. Lovey Newcomer. Pathology results were discussed with the patient by telephone by Kathrine Haddock, Bilingual Patient Services Representative and Stacie Acres, RN Nurse Navigator. The patient reported doing well after the biopsies with tenderness at the sites and a tender, partially numb/tingling Left arm. The patient stated she had full range of motion in her  Left arm. Post biopsy instructions and care were reviewed and questions were answered. The patient was encouraged to call [facility] for any additional concerns. The patient was referred to [facility] at [facility] on October 09, 2020. Pathology results reported by Terie Purser, RN on 10/02/2020. Electronically Signed   By: Lovey Newcomer M.D.   On: 10/02/2020 13:52   Result Date: 10/03/2020 CLINICAL DATA:  Patient with two indeterminate left breast masses and thickened left axillary lymph node. EXAM: ULTRASOUND GUIDED LEFT BREAST CORE NEEDLE BIOPSY [place] AXILLARY NODE CORE BIOPSY LEFT COMPARISON:  Previous  exam(s). PROCEDURE: I met with the patient and we discussed the procedure of ultrasound-guided biopsy, including benefits and alternatives. We discussed the high likelihood of a successful procedure. We discussed the risks of the procedure, including infection, bleeding, tissue injury, clip migration, and inadequate sampling. Informed written consent was given. The usual time-out protocol was performed immediately prior to the procedure. Site 1: Left breast 5 o'clock position posterior Lesion quadrant: Lower outer quadrant Using sterile technique and 1% Lidocaine as local anesthetic, under direct ultrasound visualization, a 14 gauge spring-loaded device was used to perform biopsy of left breast mass 5 o'clock position posterior using a lateral approach. At the conclusion of the procedure ribbon shaped tissue marker clip was deployed into the biopsy cavity. Follow up 2 view mammogram was performed and dictated separately. Site 2: Left breast 5 o'clock position anterior Lesion quadrant: Lower outer quadrant Using sterile technique and 1% Lidocaine as local anesthetic, under direct ultrasound visualization, a 14 gauge spring-loaded device was used to perform biopsy of left breast 5 o'clock position anterior using a lateral approach.  At the conclusion of the procedure coil tissue marker clip was deployed into the biopsy cavity. Follow up 2 view mammogram was performed and dictated separately. Site 3: Left axilla Using sterile technique and 1% Lidocaine as local anesthetic, under direct ultrasound visualization, a 14 gauge spring-loaded device was used to perform biopsy of left axillary lymph node using a lateral approach. At the conclusion of the procedure Tribell tissue marker clip was deployed into the biopsy cavity. Follow up 2 view mammogram was performed and dictated separately. IMPRESSION: Ultrasound guided biopsy of two adjacent left breast masses and cortically thickened left axillary lymph node. No apparent complications. Electronically Signed: By: Lovey Newcomer M.D. On: 10/01/2020 09:02   [place] LT BREAST BX W LOC DEV EA ADD LESION IMG BX SPEC US GUIDE  Addendum Date: 10/03/2020   ADDENDUM REPORT: 10/02/2020 13:52 ADDENDUM: Pathology revealed GRADE III INVASIVE DUCTAL CARCINOMA, DUCTAL CARCINOMA IN SITU of the Left breast, 5 o'clock, posterior. This was found to be concordant by Dr. Lovey Newcomer. Pathology revealed GRADE III INVASIVE DUCTAL CARCINOMA, DUCTAL CARCINOMA IN SITU of the Left breast, 5 o'clock, anterior. This was found to be concordant by Dr. Lovey Newcomer. Pathology revealed METASTATIC CARCINOMA IN A LYMPH NODE of the Left axilla. This was found to be concordant by Dr. Lovey Newcomer. Pathology results were discussed with the patient by telephone by Kathrine Haddock, Bilingual Patient Services Representative and Stacie Acres, RN Nurse Navigator. The patient reported doing well after the biopsies with tenderness at the sites and a tender, partially numb/tingling Left arm. The patient stated she had full range of motion in her Left arm. Post biopsy instructions and care were reviewed and questions were answered. The patient was encouraged to call [facility] for any additional concerns. The patient was  referred to [facility] on October 09, 2020. Pathology results reported by Terie Purser, RN on 10/02/2020. Electronically Signed   By: Lovey Newcomer M.D.   On: 10/02/2020 13:52   Result Date: 10/03/2020 CLINICAL DATA:  Patient with two indeterminate left breast masses and thickened left axillary lymph node. EXAM: ULTRASOUND GUIDED LEFT BREAST CORE NEEDLE BIOPSY [place] AXILLARY NODE CORE BIOPSY LEFT COMPARISON:  Previous exam(s). PROCEDURE: I met with the patient and we discussed the procedure of ultrasound-guided biopsy, including benefits and alternatives. We discussed the high likelihood of a successful  procedure. We discussed the risks of the procedure, including infection, bleeding, tissue injury, clip migration, and inadequate sampling. Informed written consent was given. The usual time-out protocol was performed immediately prior to the procedure. Site 1: Left breast 5 o'clock position posterior Lesion quadrant: Lower outer quadrant Using sterile technique and 1% Lidocaine as local anesthetic, under direct ultrasound visualization, a 14 gauge spring-loaded device was used to perform biopsy of left breast mass 5 o'clock position posterior using a lateral approach. At the conclusion of the procedure ribbon shaped tissue marker clip was deployed into the biopsy cavity. Follow up 2 view mammogram was performed and dictated separately. Site 2: Left breast 5 o'clock position anterior Lesion quadrant: Lower outer quadrant Using sterile technique and 1% Lidocaine as local anesthetic, under direct ultrasound visualization, a 14 gauge spring-loaded device was used to perform biopsy of left breast 5 o'clock position anterior using a lateral approach. At the conclusion of the procedure coil tissue marker clip was deployed into the biopsy cavity. Follow up 2 view mammogram was performed and dictated separately. Site 3: Left axilla Using sterile technique  and 1% Lidocaine as local anesthetic, under direct ultrasound visualization, a 14 gauge spring-loaded device was used to perform biopsy of left axillary lymph node using a lateral approach. At the conclusion of the procedure Tribell tissue marker clip was deployed into the biopsy cavity. Follow up 2 view mammogram was performed and dictated separately. IMPRESSION: Ultrasound guided biopsy of two adjacent left breast masses and cortically thickened left axillary lymph node. No apparent complications. Electronically Signed: By: Lovey Newcomer M.D. On: 10/01/2020 09:02      IMPRESSION: Stage IB (T1c, N1) Left Breast LOQ, Invasive Ductal Carcinoma, ER+ / PR+ / Her2+, Grade 3  Patient will be a good candidate for breast conservation with radiotherapy to left breast. We discussed the general course of radiation, potential side effects, and toxicities with radiation and the patient is interested in this approach.    PLAN:  1. Genetics 2. MRI 3. Neoadjuvant chemotherapy 4. Left lumpectomy with TAD 5. Adjuvant radiation therapy with elective coverage of axillary region 6. Hormone therapy   ------------------------------------------------  Blair Promise, PhD, MD  This document serves as a record of services personally performed by Gery Pray, MD. It was created on his behalf by Wilburn Mylar, a trained medical scribe. The creation of this record is based on the scribe's personal observations and the provider's statements to them. This document has been checked and approved by the attending provider.

## 2020-10-09 NOTE — Progress Notes (Signed)
START ON PATHWAY REGIMEN - Breast     A cycle is every 21 days:     Pertuzumab      Pertuzumab      Trastuzumab-xxxx      Trastuzumab-xxxx      Carboplatin      Docetaxel   **Always confirm dose/schedule in your pharmacy ordering system**  Patient Characteristics: Preoperative or Nonsurgical Candidate (Clinical Staging), Neoadjuvant Therapy followed by Surgery, Invasive Disease, Chemotherapy, HER2 Positive, ER Positive Therapeutic Status: Preoperative or Nonsurgical Candidate (Clinical Staging) AJCC M Category: cM0 AJCC Grade: G3 Breast Surgical Plan: Neoadjuvant Therapy followed by Surgery ER Status: Positive (+) AJCC 8 Stage Grouping: IB HER2 Status: Positive (+) AJCC T Category: cT1c AJCC N Category: cN1 PR Status: Positive (+) Intent of Therapy: Curative Intent, Discussed with Patient 

## 2020-10-09 NOTE — Assessment & Plan Note (Addendum)
10/01/2020:Screening mammogram showed a left breast mass and calcifications. Diagnostic mammogram and US showed a 1.7cm and 0.5cm mass at the 5 o'clock position in the left breast, with one 0.4cm abnormal right axillary lymph node. 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  Pathology and radiology counseling: Discussed with the patient, the details of pathology including the type of breast cancer,the clinical staging, the significance of ER, PR and HER-2/neu receptors and the implications for treatment. After reviewing the pathology in detail, we proceeded to discuss the different treatment options between surgery, radiation, chemotherapy, antiestrogen therapies.  Recommendation based on multidisciplinary tumor board: 1. Neoadjuvant chemotherapy with TCH Perjeta 6 cycles followed by Herceptin Perjeta maintenance versus Kadcyla maintenance (based on response to neoadjuvant chemo) for 1 year 2. Followed by breast conserving surgery if possible with sentinel lymph node study 3. Followed by adjuvant radiation therapy  4.  Followed by antiestrogen therapy although ER is weak  Chemotherapy Counseling: I discussed the risks and benefits of chemotherapy including the risks of nausea/ vomiting, risk of infection from low WBC count, fatigue due to chemo or anemia, bruising or bleeding due to low platelets, mouth sores, loss/ change in taste and decreased appetite. Liver and kidney function will be monitored through out chemotherapy as abnormalities in liver and kidney function may be a side effect of treatment. Cardiac dysfunction due to Herceptin and Perjeta and neuropathy risk from Taxotere were discussed in detail. Risk of permanent bone marrow dysfunction due to chemo were also discussed.  Plan: 1. Port placement 2. Echocardiogram 3. Chemotherapy class 4. Breast MRI 5.  Genetics  Return to clinic in 2 weeks to start  chemotherapy.

## 2020-10-09 NOTE — Research (Signed)
Trial:  [facility] 16070- Treatment of Refractory Nausea Patient Tammie [name] was identified by Dr. Lindi Adie as a potential candidate for the above listed study.  This Clinical Research Nurse met with Tammie [name], [id number], on 10/09/20 in a manner and location that ensures patient privacy to discuss participation in the above listed research study.  Patient is Accompanied by a friend.  A copy of the informed consent document and separate HIPAA Authorization was provided to the patient.  Patient reads, speaks, and understands [place].   Patient was provided with the business card of this Nurse and encouraged to contact the research team with any questions.  Approximately 20 minutes were spent with the patient reviewing the informed consent documents.  Patient was provided the option of taking informed consent documents home to review and was encouraged to review at their convenience with their support network, including other care providers. Patient took the consent documents home to review.  The nurse will call the pt on Monday, 10/14/20 to discuss her study participation and to answer any questions about the study.  Brion Aliment RN, BSN, CCRP Clinical Research Nurse Lead 10/09/2020 4:13 PM

## 2020-10-09 NOTE — H&P (Signed)
History of Present Illness Tammie [name]. Tammie Biasi MD; 10/09/2020 3:59 PM) The patient is a 45 year old female who presents with breast cancer. Breast MDC - 10/09/20 [name]  PCP - [name]  This is a 45 year old female with a family history of breast cancer in her mother at age 74 who presents with screening detected mass in left lower outer quadrant. Imaging showed a 1.7 x 1.1 x 1.3 cm mass at 0500 6 cmfn with a nearby 0.5 cm mass. US showed a single abnormal lymph node. Both masses and the lymph node were biopsied. The masses are both invasive ductal carcinoma grade 3 with DCIS, ER/ PR positive, Her 2 positive, Ki67 20%. The lymph node was positive for metastatic carcinoma. No previous breast surgery or breast problems.    Diagnostic Studies History Conni Slipper, RN; 10/09/2020 8:04 AM) Colonoscopy never Mammogram within last year  Medication History Conni Slipper, RN; 10/09/2020 8:03 AM) Medications Reconciled  Social History Conni Slipper, RN; 10/09/2020 8:04 AM) Caffeine use Carbonated beverages, Coffee. No alcohol use No drug use Tobacco use Never smoker.  Family History Conni Slipper, RN; 10/09/2020 8:04 AM) Bleeding disorder Mother. Breast Cancer Mother. Hypertension Mother. Prostate Cancer Father.  Pregnancy / Birth History Conni Slipper, RN; 10/09/2020 8:04 AM) Age at menarche 72 years. Gravida 0 Para 0 Regular periods  Other Problems Conni Slipper, RN; 10/09/2020 8:04 AM) Bladder Problems Depression Diabetes Mellitus Hemorrhoids High blood pressure Migraine Headache     Review of Systems Conni Slipper RN; 10/09/2020 8:04 AM) General Present- Weight Loss. Not Present- Appetite Loss, Chills, Fatigue, Fever, Night Sweats and Weight Gain. Skin Present- Rash. Not Present- Change in Wart/Mole, Dryness, Hives, Jaundice, New Lesions, Non-Healing Wounds and Ulcer. HEENT Not Present- Earache, Hearing Loss, Hoarseness, Nose Bleed, Oral Ulcers,  Ringing in the Ears, Seasonal Allergies, Sinus Pain, Sore Throat, Visual Disturbances, Wears glasses/contact lenses and Yellow Eyes. Respiratory Not Present- Bloody sputum, Chronic Cough, Difficulty Breathing, Snoring and Wheezing. Breast Present- Breast Mass. Not Present- Breast Pain, Nipple Discharge and Skin Changes. Cardiovascular Present- Swelling of Extremities. Not Present- Chest Pain, Difficulty Breathing Lying Down, Leg Cramps, Palpitations, Rapid Heart Rate and Shortness of Breath. Gastrointestinal Present- Constipation, Hemorrhoids and Nausea. Not Present- Abdominal Pain, Bloating, Bloody Stool, Change in Bowel Habits, Chronic diarrhea, Difficulty Swallowing, Excessive gas, Gets full quickly at meals, Indigestion, Rectal Pain and Vomiting. Female Genitourinary Present- Frequency. Not Present- Nocturia, Painful Urination, Pelvic Pain and Urgency. Musculoskeletal Present- Joint Stiffness. Not Present- Back Pain, Joint Pain, Muscle Pain, Muscle Weakness and Swelling of Extremities. Neurological Present- Decreased Memory and Weakness. Not Present- Fainting, Headaches, Numbness, Seizures, Tingling, Tremor and Trouble walking. Psychiatric Present- Depression. Not Present- Anxiety, Bipolar, Change in Sleep Pattern, Fearful and Frequent crying. Endocrine Present- Excessive Hunger. Not Present- Cold Intolerance, Hair Changes, Heat Intolerance, Hot flashes and New Diabetes. Hematology Not Present- Blood Thinners, Easy Bruising, Excessive bleeding, Gland problems, HIV and Persistent Infections.   Physical Exam Rodman Key K. Fawaz Borquez MD; 10/09/2020 4:00 PM)  The physical exam findings are as follows: Note:Constitutional: WDWN in NAD, conversant, no obvious deformities; resting comfortably Eyes: Pupils equal, round; sclera anicteric; moist conjunctiva; no lid lag HENT: Oral mucosa moist; good dentition Neck: No masses palpated, trachea midline; no thyromegaly Lungs: CTA bilaterally; normal respiratory  effort Breasts: symmetric, no nipple changes or discharge; no palpable axillary lymphadenopathy; slight bruising in the left lower outer quadrant; no dominant palpable masses CV: Regular rate and rhythm; no murmurs; extremities well-perfused with no edema Abd: +  bowel sounds, soft, non-tender, no palpable organomegaly; no palpable hernias Musc: Normal gait; no apparent clubbing or cyanosis in extremities Lymphatic: No palpable cervical or axillary lymphadenopathy Skin: Warm, dry; no sign of jaundice Psychiatric - alert and oriented x 4; calm mood and affect    Assessment & Plan Rodman Key K. 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 The surgical procedure has been discussed with the patient. Potential risks, benefits, alternative treatments, and expected outcomes have been explained. All of the patient's questions at this time have been answered. The likelihood of reaching the patient's treatment goal is good. The patient understand the proposed surgical procedure and wishes to proceed. Note:Total time spent today on review of medical records, radiologic tests, or lab results in addition to face-to-face time with the patient and subsequent documentation: 50 minutes   The patient has two adjacent cancers in the left lower outer quadrant and at least one positive lymph node. Our plan is to place port, obtain MRI, and begin neoadjuvant chemotherapy. Eventually, she should be a candidate for left radioactive seed localized lumpectomy/ targeted axillary lymph node dissection, followed by adjuvant radiation and anti-estrogens.  Tammie [name]. Georgette Dover, MD, [facility]  General/ Trauma Surgery   10/09/2020 4:01 PM

## 2020-10-09 NOTE — H&P (View-Only) (Signed)
History of Present Illness Tammie [name]. Tammie Dauenhauer MD; 10/09/2020 3:59 PM) The patient is a 45 year old female who presents with breast cancer. Breast MDC - 10/09/20 Tammie [name]  PCP - Tammie [name]  This is a 45 year old female with a family history of breast cancer in her mother at age 75 who presents with screening detected mass in left lower outer quadrant. Imaging showed a 1.7 x 1.1 x 1.3 cm mass at 0500 6 cmfn with a nearby 0.5 cm mass. US showed a single abnormal lymph node. Both masses and the lymph node were biopsied. The masses are both invasive ductal carcinoma grade 3 with DCIS, ER/ PR positive, Her 2 positive, Ki67 20%. The lymph node was positive for metastatic carcinoma. No previous breast surgery or breast problems.    Diagnostic Studies History Tammie Slipper, [name]; 10/09/2020 8:04 AM) Colonoscopy never Mammogram within last year  Medication History Tammie Slipper, [name]; 10/09/2020 8:03 AM) Medications Reconciled  Social History Tammie Slipper, [name]; 10/09/2020 8:04 AM) Caffeine use Carbonated beverages, Coffee. No alcohol use No drug use Tobacco use Never smoker.  Family History Tammie Slipper, [name]; 10/09/2020 8:04 AM) Bleeding disorder Mother. Breast Cancer Mother. Hypertension Mother. Prostate Cancer Father.  Pregnancy / Birth History Tammie Slipper, [name]; 10/09/2020 8:04 AM) Age at menarche 65 years. Gravida 0 Para 0 Regular periods  Other Problems Tammie Slipper, [name]; 10/09/2020 8:04 AM) Bladder Problems Depression Diabetes Mellitus Hemorrhoids High blood pressure Migraine Headache     Review of Systems Tammie [name]; 10/09/2020 8:04 AM) General Present- Weight Loss. Not Present- Appetite Loss, Chills, Fatigue, Fever, Night Sweats and Weight Gain. Skin Present- Rash. Not Present- Change in Wart/Mole, Dryness, Hives, Jaundice, New Lesions, Non-Healing Wounds and Ulcer. HEENT Not Present- Earache, Hearing Loss, Hoarseness, Nose Bleed, Oral Ulcers,  Ringing in the Ears, Seasonal Allergies, Sinus Pain, Sore Throat, Visual Disturbances, Wears glasses/contact lenses and Yellow Eyes. Respiratory Not Present- Bloody sputum, Chronic Cough, Difficulty Breathing, Snoring and Wheezing. Breast Present- Breast Mass. Not Present- Breast Pain, Nipple Discharge and Skin Changes. Cardiovascular Present- Swelling of Extremities. Not Present- Chest Pain, Difficulty Breathing Lying Down, Leg Cramps, Palpitations, Rapid Heart Rate and Shortness of Breath. Gastrointestinal Present- Constipation, Hemorrhoids and Nausea. Not Present- Abdominal Pain, Bloating, Bloody Stool, Change in Bowel Habits, Chronic diarrhea, Difficulty Swallowing, Excessive gas, Gets full quickly at meals, Indigestion, Rectal Pain and Vomiting. Female Genitourinary Present- Frequency. Not Present- Nocturia, Painful Urination, Pelvic Pain and Urgency. Musculoskeletal Present- Joint Stiffness. Not Present- Back Pain, Joint Pain, Muscle Pain, Muscle Weakness and Swelling of Extremities. Neurological Present- Decreased Memory and Weakness. Not Present- Fainting, Headaches, Numbness, Seizures, Tingling, Tremor and Trouble walking. Psychiatric Present- Depression. Not Present- Anxiety, Bipolar, Change in Sleep Pattern, Fearful and Frequent crying. Endocrine Present- Excessive Hunger. Not Present- Cold Intolerance, Hair Changes, Heat Intolerance, Hot flashes and New Diabetes. Hematology Not Present- Blood Thinners, Easy Bruising, Excessive bleeding, Gland problems, HIV and Persistent Infections.   Physical Exam Tammie Key K. Laquisha Northcraft MD; 10/09/2020 4:00 PM)  The physical exam findings are as follows: Note:Constitutional: WDWN in NAD, conversant, no obvious deformities; resting comfortably Eyes: Pupils equal, round; sclera anicteric; moist conjunctiva; no lid lag HENT: Oral mucosa moist; good dentition Neck: No masses palpated, trachea midline; no thyromegaly Lungs: CTA bilaterally; normal respiratory  effort Breasts: symmetric, no nipple changes or discharge; no palpable axillary lymphadenopathy; slight bruising in the left lower outer quadrant; no dominant palpable masses CV: Regular rate and rhythm; no murmurs; extremities well-perfused with no edema Abd: +  bowel sounds, soft, non-tender, no palpable organomegaly; no palpable hernias Musc: Normal gait; no apparent clubbing or cyanosis in extremities Lymphatic: No palpable cervical or axillary lymphadenopathy Skin: Warm, dry; no sign of jaundice Psychiatric - alert and oriented x 4; calm mood and affect    Assessment & Plan Tammie Key K. 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 The surgical procedure has been discussed with the patient. Potential risks, benefits, alternative treatments, and expected outcomes have been explained. All of the patient's questions at this time have been answered. The likelihood of reaching the patient's treatment goal is good. The patient understand the proposed surgical procedure and wishes to proceed. Note:Total time spent today on review of medical records, radiologic tests, or lab results in addition to face-to-face time with the patient and subsequent documentation: 50 minutes   The patient has two adjacent cancers in the left lower outer quadrant and at least one positive lymph node. Our plan is to place port, obtain MRI, and begin neoadjuvant chemotherapy. Eventually, she should be a candidate for left radioactive seed localized lumpectomy/ targeted axillary lymph node dissection, followed by adjuvant radiation and anti-estrogens.  Tammie [name]. Georgette Dover, MD, [facility] Surgery  General/ Trauma Surgery   10/09/2020 4:01 PM

## 2020-10-09 NOTE — Patient Instructions (Signed)

## 2020-10-09 NOTE — Therapy (Signed)
[place], [place], [address] Phone: [phone]   Fax:  [phone]  Physical Therapy Evaluation  Patient Details  Name: Tammie [name] MRN: [id number] Date of Birth: Dec 26, 1975 Referring Provider (PT): Dr. Donnie Mesa   Encounter Date: 10/09/2020   PT End of Session - 10/09/20 1648    Visit Number 1    Number of Visits 2    Date for PT Re-Evaluation 04/10/21    PT Start Time 9470    PT Stop Time 9628   Also saw pt from 1550-1558 for a total of 26 minutes   PT Time Calculation (min) 18 min    Activity Tolerance Patient tolerated treatment well    Behavior During Therapy [facility] for tasks assessed/performed           Past Medical History:  Diagnosis Date  . Anxiety   . Arrhythmia   . Depression   . Fibroid, uterine   . Fibromyalgia   . Hx of degenerative disc disease   . Hypertension   . Interstitial cystitis   . Microadenoma   . Migraines     Past Surgical History:  Procedure Laterality Date  . CHOLECYSTECTOMY      There were no vitals filed for this visit.    Subjective Assessment - 10/09/20 1638    Subjective Patient reports she is here today to be seen by her medical team for her newly diagnosed left breast cancer.    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 It measures 1.7 cm and 5 mm in the lower outer quadrant. It is ER/PR weakly positive and HER2 positive with a Ki67 of 20%. She has a positive axillary lymph node.    Patient Stated Goals Reduce lymphedema risk and learn post op shoulder ROM HEP    Currently in Pain? No/denies              [facility] PT Assessment - 10/09/20 0001      Assessment   Medical Diagnosis Left breast cancer    Referring Provider (PT) Dr. Donnie Mesa    Onset Date/Surgical Date 08/20/20    Hand Dominance Right    Prior Therapy none      Precautions    Precautions Other (comment)    Precaution Comments active cancer      Restrictions   Weight Bearing Restrictions No      Balance Screen   Has the patient fallen in the past 6 months No    Has the patient had a decrease in activity level because of a fear of falling?  No    Is the patient reluctant to leave their home because of a fear of falling?  No      Home Ecologist residence    Living Arrangements Spouse/significant other    Available Help at Discharge Family      Prior Function   Level of Independence Independent    Vocation Full time employment    Vocation Requirements house cleaner    Leisure She does not exercise      Cognition   Overall Cognitive Status Within Functional Limits for tasks assessed      Functional Tests   Functional tests Sit to Stand      Sit to Stand   Comments 30 seconds = 10 reps      Posture/Postural Control   Posture/Postural Control Postural  limitations    Postural Limitations Rounded Shoulders;Forward head      ROM / Strength   AROM / PROM / Strength AROM;Strength      AROM   Overall AROM Comments Cervical AROM is WNL    AROM Assessment Site Shoulder    Right/Left Shoulder Right;Left    Right Shoulder Extension 42 Degrees    Right Shoulder Flexion 141 Degrees    Right Shoulder ABduction 156 Degrees    Right Shoulder Internal Rotation 77 Degrees    Right Shoulder External Rotation 88 Degrees    Left Shoulder Extension 44 Degrees    Left Shoulder Flexion 153 Degrees    Left Shoulder ABduction 153 Degrees    Left Shoulder Internal Rotation 64 Degrees    Left Shoulder External Rotation 83 Degrees      Strength   Overall Strength Within functional limits for tasks performed             LYMPHEDEMA/ONCOLOGY QUESTIONNAIRE - 10/09/20 0001      Type   Cancer Type Left breast cancer      Lymphedema Assessments   Lymphedema Assessments Upper extremities      Right Upper Extremity Lymphedema   10  cm Proximal to Olecranon Process 31.3 cm    Olecranon Process 26.1 cm    10 cm Proximal to Ulnar Styloid Process 24.5 cm    Just Proximal to Ulnar Styloid Process 16.8 cm    Across Hand at [employer] 19.6 cm    At [place] of 2nd Digit 6.4 cm      Left Upper Extremity Lymphedema   10 cm Proximal to Olecranon Process 31.5 cm    Olecranon Process 24.9 cm    10 cm Proximal to Ulnar Styloid Process 23.4 cm    Just Proximal to Ulnar Styloid Process 16.1 cm    Across Hand at [employer] 18 cm    At [place] of 2nd Digit 6.3 cm           L-DEX FLOWSHEETS - 10/09/20 1600      L-DEX LYMPHEDEMA SCREENING   Measurement Type Unilateral    L-DEX MEASUREMENT EXTREMITY Upper Extremity    POSITION  Standing    DOMINANT SIDE Right    At Risk Side Left    BASELINE SCORE (UNILATERAL) -1.1           The patient was assessed using the L-Dex machine today to produce a lymphedema index baseline score. The patient will be reassessed on a regular basis (typically every 3 months) to obtain new L-Dex scores. If the score is > 6.5 points away from his/her baseline score indicating onset of subclinical lymphedema, it will be recommended to wear a compression garment for 4 weeks, 12 hours per day and then be reassessed. If the score continues to be > 6.5 points from baseline at reassessment, we will initiate lymphedema treatment. Assessing in this manner has a 95% rate of preventing clinically significant lymphedema.      Katina Dung - 10/09/20 0001    Open a tight or new jar Moderate difficulty    Do heavy household chores (wash walls, wash floors) Severe difficulty    Carry a shopping bag or briefcase Moderate difficulty    Wash your back Moderate difficulty    Use a knife to cut food Moderate difficulty    Recreational activities in which you take some force or impact through your arm, shoulder, or hand (golf, hammering, tennis) Moderate difficulty  During the past week, to what extent has your  arm, shoulder or hand problem interfered with your normal social activities with family, friends, neighbors, or groups? Modererately    During the past week, to what extent has your arm, shoulder or hand problem limited your work or other regular daily activities Quite a bit    Arm, shoulder, or hand pain. Moderate    Tingling (pins and needles) in your arm, shoulder, or hand Mild    Difficulty Sleeping Moderate difficulty    DASH Score 52.27 %            Objective measurements completed on examination: See above findings.         Patient was instructed today in a home exercise program today for post op shoulder range of motion. These included active assist shoulder flexion in sitting, scapular retraction, wall walking with shoulder abduction, and hands behind head external rotation.  She was encouraged to do these twice a day, holding 3 seconds and repeating 5 times when permitted by her physician.          PT Education - 10/09/20 1647    Education Details Lymphedema risk reduction and post op shoulder ROM HEP    Person(s) Educated Patient    Methods Explanation;Demonstration;Handout    Comprehension Returned demonstration;Verbalized understanding               PT Long Term Goals - 10/09/20 1656      PT LONG TERM GOAL #1   Title Patient will demonstrate she has regained full shoulder ROM and function post operatively compared to baselines.    Time 6    Period Months    Status New    Target Date 04/10/21           Breast Clinic Goals - 10/09/20 1656      Patient will be able to verbalize understanding of pertinent lymphedema risk reduction practices relevant to her diagnosis specifically related to skin care.   Time 1    Period Days    Status Achieved      Patient will be able to return demonstrate and/or verbalize understanding of the post-op home exercise program related to regaining shoulder range of motion.   Time 1    Period Days    Status Achieved       Patient will be able to verbalize understanding of the importance of attending the postoperative After Breast Cancer Class for further lymphedema risk reduction education and therapeutic exercise.   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 It measures 1.7 cm and 5 mm in the lower outer quadrant. It is ER/PR weakly positive and HER2 positive with a Ki67 of 20%. She has a positive axillary lymph node. Her multidisciplinary medical team met prior to her assessments to determine a recommended treatment plan. She is planning to have neoadjuvant chemotherapy followed by a left lumpectomy and targeted node dissection followed by radiation and anti-estrogen therapy. She will benefit from a post op PT reassessment to determine needs and from L-Dex screens every 3 months for 2 years to detect subclinical lymphedema.    Stability/Clinical Decision Making Stable/Uncomplicated    Clinical Decision Making Low    Rehab Potential Excellent  PT Frequency --   Eval and 1 f/u visit   PT Treatment/Interventions ADLs/Self Care Home Management;Therapeutic exercise;Patient/family education    PT Next Visit Plan Will reassess 3-4 weeks post op    PT Home Exercise Plan Post op shoulder ROM HEP    Consulted and Agree with Plan of Care Patient           Patient will benefit from skilled therapeutic intervention in order to improve the following deficits and impairments:  Postural dysfunction,Decreased range of motion,Decreased knowledge of precautions,Impaired UE functional use,Pain  Visit Diagnosis: Malignant neoplasm of lower-outer quadrant of left breast of female, estrogen receptor positive ([place]) - Plan: PT plan of care cert/re-cert  Abnormal posture - Plan: PT plan of care cert/re-cert   Patient will follow up at  outpatient cancer rehab 3-4 weeks following surgery.  If the patient requires physical therapy at that time, a specific plan will be dictated and sent to the referring physician for approval. The patient was educated today on appropriate basic range of motion exercises to begin post operatively and the importance of attending the After Breast Cancer class following surgery.  Patient was educated today on lymphedema risk reduction practices as it pertains to recommendations that will benefit the patient immediately following surgery.  She verbalized good understanding.      Problem List Patient Active Problem List   Diagnosis Date Noted  . Malignant neoplasm of lower-outer quadrant of left breast of female, estrogen receptor positive ([place]) 10/03/2020   Annia Friendly, PT 10/09/20 4:59 PM  [place], [place], [address] Phone: [phone]   Fax:  [phone]  Name: Audrionna Lampton MRN: [id number] Date of Birth: 09-28-1975

## 2020-10-10 ENCOUNTER — Encounter: Payer: Self-pay | Admitting: Genetic Counselor

## 2020-10-10 ENCOUNTER — Encounter: Payer: Self-pay | Admitting: Licensed Clinical Social Worker

## 2020-10-10 DIAGNOSIS — Z806 Family history of leukemia: Secondary | ICD-10-CM | POA: Insufficient documentation

## 2020-10-10 DIAGNOSIS — Z8042 Family history of malignant neoplasm of prostate: Secondary | ICD-10-CM | POA: Insufficient documentation

## 2020-10-10 DIAGNOSIS — Z803 Family history of malignant neoplasm of breast: Secondary | ICD-10-CM | POA: Insufficient documentation

## 2020-10-10 DIAGNOSIS — Z800 Family history of malignant neoplasm of digestive organs: Secondary | ICD-10-CM | POA: Insufficient documentation

## 2020-10-10 NOTE — Progress Notes (Signed)
REFERRING PROVIDER: Nicholas Lose, MD [place],  [place] [address]  PRIMARY PROVIDER:  [name], FNP  PRIMARY REASON FOR VISIT:  1. Malignant neoplasm of lower-outer quadrant of left breast of female, estrogen receptor positive ([place])   2. Family history of breast cancer   3. Family history of prostate cancer   4. Family history of stomach cancer   5. Family history of leukemia       HISTORY OF PRESENT ILLNESS:   Tammie [name], a 45 y.o. female, was seen for a [place] cancer genetics consultation at the request of Dr. Lindi Adie due to a personal and family history of cancer.  Tammie [name] presents to clinic today to discuss the possibility of a hereditary predisposition to cancer, genetic testing, and to further clarify her future cancer risks, as well as potential cancer risks for family members.   In April of 2022, at the age of 38, Tammie [name] was diagnosed with invasive ductal carcinoma and ductal carcinoma in situ of the left breast. The tumor is ER+/PR+/Her2+. The treatment plan includes adjuvant chemotherapy, surgery, radiation therapy, and antiestrogen therapy.   CANCER HISTORY:  Oncology History  Malignant neoplasm of lower-outer quadrant of left breast of female, estrogen receptor positive ([place])  10/01/2020 Initial Diagnosis   Screening mammogram showed a left breast mass and calcifications. Diagnostic mammogram and US showed a 1.7cm and 0.5cm mass at the 5 o'clock position in the left breast, with one 0.4cm abnormal right axillary lymph node. 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   10/09/2020 Cancer Staging   Staging form: Breast, AJCC 8th Edition - Clinical stage from 10/09/2020: Stage IB (cT2, cN1, cM0, G3, ER+, PR+, HER2+) - Signed by Nicholas Lose, MD on 10/09/2020 Stage prefix: Initial diagnosis Histologic grading system: 3 grade system Percentage of positive estrogen  receptors (%): 5 Percentage of positive progesterone receptors (%): 1 Ki-67 (%): 20   10/18/2020 -  Chemotherapy    Patient is on Treatment Plan: BREAST  DOCETAXEL + CARBOPLATIN + TRASTUZUMAB + PERTUZUMAB  (TCHP) Q21D          RISK FACTORS:  Menarche was at age 43.  No live births.  OCP use for approximately 1.5-2 years.  Ovaries intact: yes.  Hysterectomy: no.  Menopausal status: premenopausal.  HRT use: 0 years. Colonoscopy: no; not examined. Mammogram within the last year: yes.   Past Medical History:  Diagnosis Date  . Anxiety   . Arrhythmia   . Depression   . Family history of breast cancer   . Family history of leukemia   . Family history of prostate cancer   . Family history of stomach cancer   . Fibroid, uterine   . Fibromyalgia   . Hx of degenerative disc disease   . Hypertension   . Interstitial cystitis   . Microadenoma   . Migraines     Past Surgical History:  Procedure Laterality Date  . CHOLECYSTECTOMY      Social History   Socioeconomic History  . Marital status: Unknown    Spouse name: Not on file  . Number of children: Not on file  . Years of education: Not on file  . Highest education level: Not on file  Occupational History  . Not on file  Tobacco Use  . Smoking status: Never Smoker  . Smokeless tobacco: Not on file  Substance and Sexual Activity  . Alcohol  use: Never  . Drug use: Never  . Sexual activity: Not on file  Other Topics Concern  . Not on file  Social History Narrative  . Not on file   Social Determinants of Health   Financial Resource Strain: Not on file  Food Insecurity: Not on file  Transportation Needs: Not on file  Physical Activity: Not on file  Stress: Not on file  Social Connections: Not on file     FAMILY HISTORY:  We obtained a detailed, 4-generation family history.  Significant diagnoses are listed below: Family History  Problem Relation Age of Onset  . Breast cancer Mother 49  . Prostate cancer  Father 53  . Stomach cancer Paternal Grandmother        dx older than 60  . Leukemia Paternal Grandfather 49       dx older than 71  . Breast cancer Paternal Aunt   . Breast cancer Other        mother's first cousin   Tammie [name] does not have any children. She has one full-sister (age 43), who has not had cancer. She also has a paternal half-sister and a paternal half-brother (ages 29 and 54), although she has limited information about these family members.  Tammie [name] mother is alive at age 67 and had breast cancer at age 44. There is one maternal aunt and there are two maternal uncles. There is no known cancer among maternal aunts/uncles or maternal cousins. Tammie [name] maternal grandmother died in her 88s without cancer. Her maternal grandfather died at age 66 without cancer. A maternal first cousin once removed (mother's maternal first cousin) had breast cancer at an unknown age.  Tammie [name] father Is alive at age 55 with prostate cancer diagnosed at age 1. There were two paternal aunts. One aunt had breast cancer at an unknown age. There is no known cancer among paternal cousins. Tammie [name] paternal grandmother had stomach cancer older than 75. Her paternal grandfather had leukemia older than 6.   Tammie [name] is unaware of previous family history of genetic testing for hereditary cancer risks. Patient's maternal ancestors are of [place] descent, and paternal ancestors are of [place] descent. There is no reported Ashkenazi Jewish ancestry. There is no known consanguinity.  GENETIC COUNSELING ASSESSMENT: Ms. Fill is a 45 y.o. female with a personal history of breast cancer and a family history of breast cancer, prostate cancer, stomach cancer, and leukemia, which is somewhat suggestive of a hereditary cancer syndrome and predisposition to cancer. We, therefore, discussed and recommended the following at today's visit.   DISCUSSION: We discussed that approximately  5-10% of breast cancer is hereditary, with most cases associated with the BRCA1 and BRCA2 genes. There are other genes that can be associated with hereditary breast cancer syndromes. These include ATM, CHEK2, PALB2, etc. We discussed that testing is beneficial for several reasons, including knowing about other cancer risks, identifying potential screening and risk-reduction options that may be appropriate, and to understand if other family members could be at risk for cancer and allow them to undergo genetic testing.  We reviewed the characteristics, features and inheritance patterns of hereditary cancer syndromes. We also discussed genetic testing, including the appropriate family members to test, the process of testing, insurance coverage and turn-around-time for results. We discussed the implications of a negative, positive and/or variant of uncertain significant result. We recommended Ms. Ashline pursue genetic testing for the [facility] CancerNext-Expanded + RNAinsight gene panel.   The CancerNext-Expanded +  RNAinsight gene panel offered by [employer] and includes sequencing and rearrangement analysis for the following 77 genes: AIP, ALK, APC, ATM, AXIN2, BAP1, BARD1, BLM, BMPR1A, BRCA1, BRCA2, BRIP1, CDC73, CDH1, CDK4, CDKN1B, CDKN2A, CHEK2, CTNNA1, DICER1, FANCC, FH, FLCN, GALNT12, KIF1B, LZTR1, MAX, MEN1, MET, MLH1, MSH2, MSH3, MSH6, MUTYH, NBN, NF1, NF2, NTHL1, PALB2, PHOX2B, PMS2, POT1, PRKAR1A, PTCH1, PTEN, RAD51C, RAD51D, RB1, RECQL, RET, SDHA, SDHAF2, SDHB, SDHC, SDHD, SMAD4, SMARCA4, SMARCB1, SMARCE1, STK11, SUFU, TMEM127, TP53, TSC1, TSC2, VHL and XRCC2 (sequencing and deletion/duplication); EGFR, EGLN1, HOXB13, KIT, MITF, PDGFRA, POLD1 and POLE (sequencing only); EPCAM and GREM1 (deletion/duplication only). RNA data is routinely analyzed for use in variant interpretation for all genes.  Based on Ms. Roza's personal and family history of cancer, she meets medical criteria for genetic testing.  Despite that she meets criteria, there may still be an out of pocket cost.   PLAN: After considering the risks, benefits, and limitations, Ms. Branscom provided informed consent to pursue genetic testing and the blood sample was sent to [facility] for analysis of the CancerNext-Expanded + RNAinsight panel. Results should be available within approximately two-three weeks' time, at which point they will be disclosed by telephone to Ms. Ghattas, as will any additional recommendations warranted by these results. Ms. Fasnacht will receive a summary of her genetic counseling visit and a copy of her results once available. This information will also be available in Epic.   Ms. Michalowski questions were answered to her satisfaction today. Our contact information was provided should additional questions or concerns arise. Thank you for the referral and allowing [place] to share in the care of your patient.   Clint Guy, [place], [facility] Licensed, Certified Dispensing [email or web address] Phone: [phone]  The patient was seen for a total of 20 minutes in face-to-face genetic counseling.  This patient was discussed with Drs. Magrinat, Lindi Adie and/or Burr Medico who agrees with the above.    _______________________________________________________________________ For Office Staff:  Number of people involved in session: 1 Was an Intern/ student involved with case: no

## 2020-10-10 NOTE — Progress Notes (Signed)
Tammie [name]  Clinical Social [name] was referred by Management consultant for assessment of psychosocial needs.  Clinical Social Worker contacted patient by phone  to offer support and assess for needs.  Patient is stressed about potential cost of treatment. No other resource needs at this time.  CSW explored pt's knowledge about her OOP maximum with insurance- she is unsure but will look it up when she goes home from [name].  CSW also informed patient about various breast cancer foundations that she might be able to apply to for assistance. E-mailed information to patient per her request.     Christeen Douglas, [place] Worker [facility]

## 2020-10-10 NOTE — Progress Notes (Signed)
Tammie [name] Psychosocial Distress Screening Counseling Intern  Counseling intern was referred by distress screening protocol.  The patient scored a 5 on the Psychosocial Distress Thermometer which indicates moderate distress. Counseling intern met with patient in exam room" to assess for distress and other psychosocial needs. The patient attended clinic with a good friend, and they both felt better after having the information. The patient described her distress as still at a 5 and she reported having good support from friends, family, and the church. She described having anxiety and depression for a long time, as well as having concerns about paying for tx. She felt better informed after clinic and explained her physical problems to the intern. The patient would like to connect with both a dietitian and a Education officer, museum.  ONCBCN DISTRESS SCREENING 10/10/2020  Screening Type Initial Screening  Distress experienced in past week (1-10) 5  Practical problem type Work/school  Emotional problem type Depression;Nervousness/Anxiety  Information Concerns Type Lack of info about diagnosis;Lack of info about treatment;Lack of info about complementary therapy choices  Physical Problem type Pain;Nausea/vomiting;Sleep/insomnia;Constipation/diarrhea;Swollen arms/legs  Referral to clinical social work Yes  Referral to dietition Yes  Referral to support programs Yes    Follow up needed: Yes.    If yes, follow up plan: Intern will forward information to Garnet Koyanagi, [place], and Ernestene Kiel, RD.   Gaylyn Rong Counseling Intern

## 2020-10-11 ENCOUNTER — Telehealth: Payer: Self-pay | Admitting: Hematology and Oncology

## 2020-10-11 ENCOUNTER — Encounter (HOSPITAL_BASED_OUTPATIENT_CLINIC_OR_DEPARTMENT_OTHER): Payer: Self-pay | Admitting: Surgery

## 2020-10-11 ENCOUNTER — Other Ambulatory Visit: Payer: Self-pay

## 2020-10-11 NOTE — Telephone Encounter (Signed)
Scheduled appts per 4/29 sch msg. Called pt, no answer. Left msg with appts dates and times.  

## 2020-10-14 ENCOUNTER — Encounter (HOSPITAL_BASED_OUTPATIENT_CLINIC_OR_DEPARTMENT_OTHER)
Admission: RE | Admit: 2020-10-14 | Discharge: 2020-10-14 | Disposition: A | Discharge status: Home / Self Care | Payer: 59 | Source: Ambulatory Visit | Attending: Surgery | Admitting: Surgery

## 2020-10-14 ENCOUNTER — Encounter: Payer: Self-pay | Admitting: *Deleted

## 2020-10-14 ENCOUNTER — Telehealth: Payer: Self-pay | Admitting: *Deleted

## 2020-10-14 ENCOUNTER — Other Ambulatory Visit (HOSPITAL_COMMUNITY)
Admission: RE | Admit: 2020-10-14 | Discharge: 2020-10-14 | Disposition: A | Discharge status: Home / Self Care | Payer: 59 | Source: Ambulatory Visit | Attending: Surgery | Admitting: Surgery

## 2020-10-14 DIAGNOSIS — Z01812 Encounter for preprocedural laboratory examination: Secondary | ICD-10-CM | POA: Diagnosis present

## 2020-10-14 DIAGNOSIS — Z01810 Encounter for preprocedural cardiovascular examination: Secondary | ICD-10-CM | POA: Insufficient documentation

## 2020-10-14 DIAGNOSIS — Z20822 Contact with and (suspected) exposure to covid-19: Secondary | ICD-10-CM | POA: Diagnosis not present

## 2020-10-14 NOTE — Progress Notes (Signed)
The following biosimilar Udenyca (pegfilgrastim-cbqv) has been selected for use in this patient.  The following biosimilar Kanjinti (trastuzumab-anns) has been selected for use in this patient.   Kennith Center, Pharm.D., CPP 10/14/2020@3 :09 PM

## 2020-10-14 NOTE — Progress Notes (Signed)
Pharmacist Chemotherapy Monitoring - Initial Assessment    Anticipated start date: 10/18/20   Regimen:  . Are orders appropriate based on the patient's diagnosis, regimen, and cycle? Yes . Does the plan date match the patient's scheduled date? Yes . Is the sequencing of drugs appropriate? Yes . Are the premedications appropriate for the patient's regimen? Yes . Prior Authorization for treatment is: Pending o If applicable, is the correct biosimilar selected based on the patient's insurance? yes  Organ Function and Labs: Marland Kitchen Are dose adjustments needed based on the patient's renal function, hepatic function, or hematologic function? No . Are appropriate labs ordered prior to the start of patient's treatment? Yes . Other organ system assessment, if indicated: women of childbearing potential: pregnancy status  . The following baseline labs, if indicated, have been ordered: N/A  Dose Assessment: . Are the drug doses appropriate? Yes . Are the following correct: o Drug concentrations Yes o IV fluid compatible with drug Yes o Administration routes Yes o Timing of therapy Yes . If applicable, does the patient have documented access for treatment and/or plans for port-a-cath placement? yes . If applicable, have lifetime cumulative doses been properly documented and assessed? yes Lifetime Dose Tracking  No doses have been documented on this patient for the following tracked chemicals: Doxorubicin, Epirubicin, Idarubicin, Daunorubicin, Mitoxantrone, Bleomycin, Oxaliplatin, Carboplatin, Liposomal Doxorubicin  o   Toxicity Monitoring/Prevention: . The patient has the following take home antiemetics prescribed: Ondansetron and Prochlorperazine . The patient has the following take home medications prescribed: N/A . Medication allergies and previous infusion related reactions, if applicable, have been reviewed and addressed. Yes . The patient's current medication list has been assessed for drug-drug  interactions with their chemotherapy regimen. no significant drug-drug interactions were identified on review.  Order Review: . Are the treatment plan orders signed? Yes . Is the patient scheduled to see a provider prior to their treatment? No  I verify that I have reviewed each item in the above checklist and answered each question accordingly.   Kennith Center, Pharm.D., CPP 10/14/2020@3 :19 PM

## 2020-10-14 NOTE — Telephone Encounter (Signed)
Spoke with patient to follow up from [facility] and assess navigation needs and verify appointments.  Reviewed changes in appointments.  Echo now 5/4 at 8am and patient education 5/4 at 10am.  1st chemo will be 5/6 and patient is having port placed 5/5. Encouraged her to call should she have any questions. Patient verbalized understanding.

## 2020-10-14 NOTE — Telephone Encounter (Signed)
[id number] - TREATMENT OF REFRACTORY NAUSEA Research nurse called and spoke to the pt this afternoon.  The pt stated she read over the consent form.  The nurse asked the pt if she had any questions about research study.  The pt denied any questions.  The research nurse then asked the pt is she was interested in participating in the study.  The pt stated she did not want to participate in the study.  The patient said that she was concerned about the side effects listed in the consent form, and she feels that she "has too much on her plate now".  The pt was thanked by the research nurse for her consideration of the study.  The nurse wished the pt well with her upcoming treatments.  The nurse mentioned to the pt about the DCP-001 study.  The nurse will see the pt at her next visit and give her the consent for this study which is a "one time collection of information to better understand the clinical trial participation process".   Brion Aliment RN, BSN, CCRP Clinical Research Nurse Lead 10/14/2020 4:15 PM

## 2020-10-15 ENCOUNTER — Telehealth: Payer: Self-pay | Admitting: *Deleted

## 2020-10-15 LAB — SARS CORONAVIRUS 2 (TAT 6-24 HRS): SARS Coronavirus 2: NEGATIVE

## 2020-10-15 NOTE — Telephone Encounter (Signed)
Spoke with patient to review her appointments for chemo on 5/6.  Instructed her to get an updated calendar when she comes in for chemo class 5/4 as their should be an appointment on 5/13 for lab/flush and Dr.Gudena. Patient verbalized understanding.

## 2020-10-16 ENCOUNTER — Other Ambulatory Visit: Payer: Self-pay

## 2020-10-16 ENCOUNTER — Ambulatory Visit (HOSPITAL_BASED_OUTPATIENT_CLINIC_OR_DEPARTMENT_OTHER)
Admission: RE | Admit: 2020-10-16 | Discharge: 2020-10-16 | Disposition: A | Discharge status: Home / Self Care | Payer: 59 | Source: Ambulatory Visit | Attending: Hematology and Oncology | Admitting: Hematology and Oncology

## 2020-10-16 ENCOUNTER — Ambulatory Visit (HOSPITAL_COMMUNITY)
Admission: RE | Admit: 2020-10-16 | Discharge: 2020-10-16 | Disposition: A | Discharge status: Home / Self Care | Payer: 59 | Source: Ambulatory Visit | Attending: Hematology and Oncology | Admitting: Hematology and Oncology

## 2020-10-16 ENCOUNTER — Encounter: Payer: Self-pay | Admitting: Hematology and Oncology

## 2020-10-16 ENCOUNTER — Inpatient Hospital Stay: Payer: 59 | Attending: Hematology and Oncology

## 2020-10-16 DIAGNOSIS — C50512 Malignant neoplasm of lower-outer quadrant of left female breast: Secondary | ICD-10-CM | POA: Insufficient documentation

## 2020-10-16 DIAGNOSIS — Z170 Estrogen receptor positive status [ER+]: Secondary | ICD-10-CM | POA: Insufficient documentation

## 2020-10-16 DIAGNOSIS — Z0189 Encounter for other specified special examinations: Secondary | ICD-10-CM | POA: Diagnosis not present

## 2020-10-16 DIAGNOSIS — Z5111 Encounter for antineoplastic chemotherapy: Secondary | ICD-10-CM | POA: Insufficient documentation

## 2020-10-16 DIAGNOSIS — Z79899 Other long term (current) drug therapy: Secondary | ICD-10-CM | POA: Insufficient documentation

## 2020-10-16 LAB — ECHOCARDIOGRAM COMPLETE
AR max vel: 2.39 cm2
AV Area VTI: 2.42 cm2
AV Area mean vel: 2.37 cm2
AV Mean grad: 6 mmHg
AV Peak grad: 10.4 mmHg
Ao pk vel: 1.61 m/s
Area-P 1/2: 3.93 cm2
MV M vel: 4.97 m/s
MV Peak grad: 98.8 mmHg
S' Lateral: 3 cm

## 2020-10-16 IMAGING — MR MR BREAST BILAT WO/W CM
5 of 7 series · 29 of 48 positions shown · IV contrast (with contrast)
Comparison: Previous exam(s).

CLINICAL DATA: Recent diagnosis of left breast cancer, 2 sites.
Staging MRI of the breast.

LABS:  Does not apply
EXAM:
BILATERAL BREAST MRI WITH AND WITHOUT CONTRAST
TECHNIQUE: Multiplanar, multisequence MR images of both breasts were obtained
prior to and following the intravenous administration of 7 ml of
Gadavist

[Series 2: T2 · axial · 3.0mm · 0.89mm/px · z∈[-105,+90]mm · 3 of 50 slices shown]
[im 1/50]
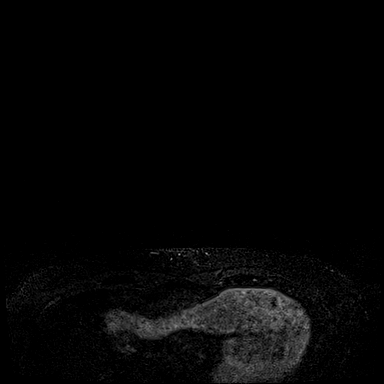
[im 25/50]
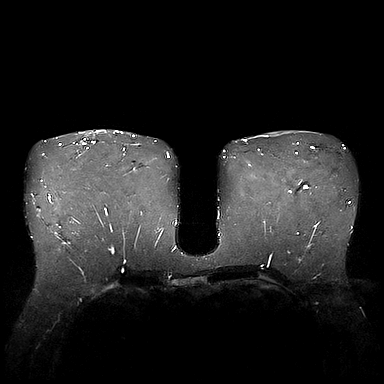
[im 50/50]
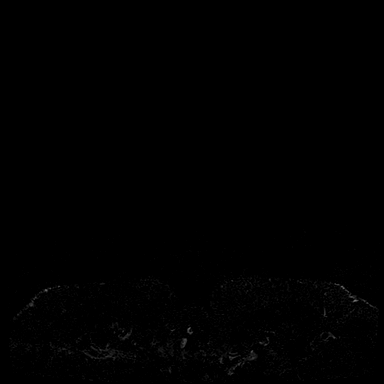

[Series 3: T1 fat-sat · axial · 1.2mm · 0.76mm/px · z∈[-112,+98]mm · 8 of 176 slices shown (1 of 3)]
[im 1/176]
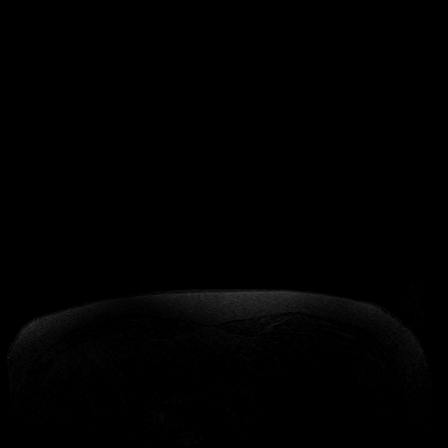
[im 20/176]
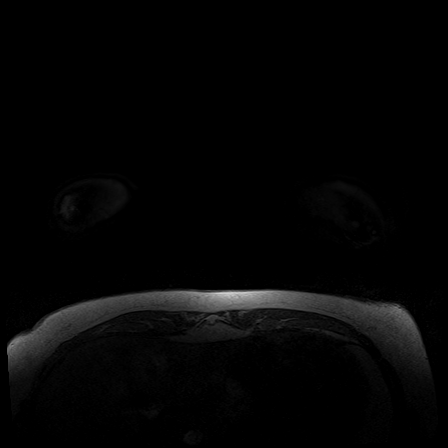
[im 59/176]
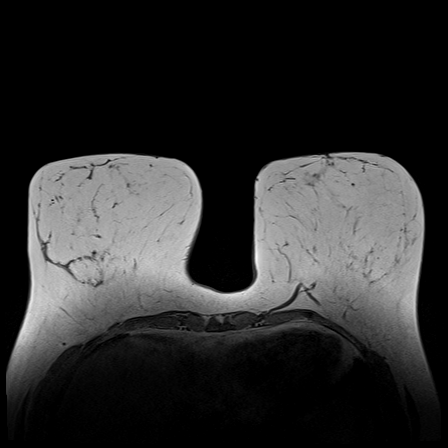
[im 78/176]
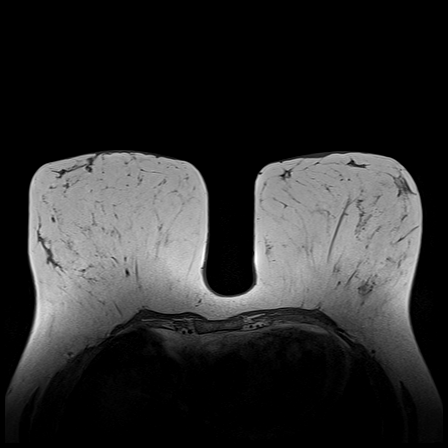
[im 98/176]
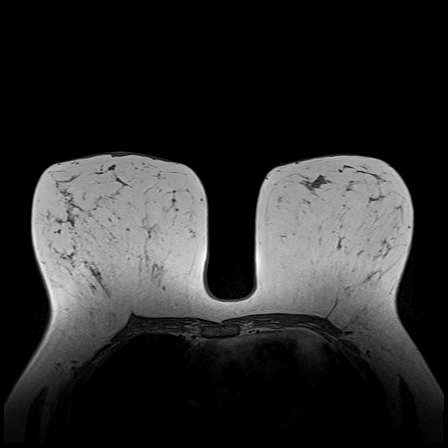
[im 117/176]
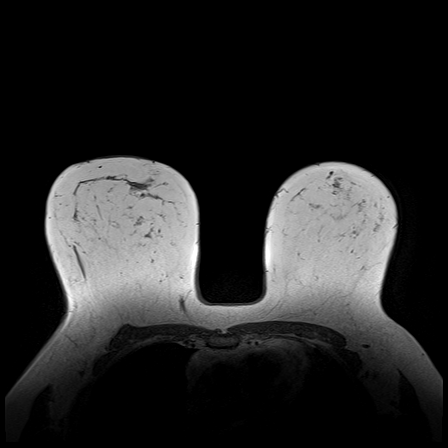
[im 156/176]
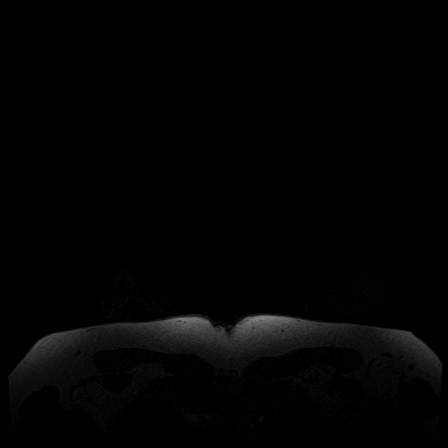
[im 176/176]
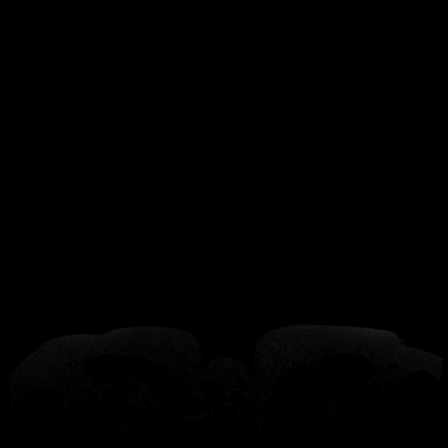

[Series 5: T1 fat-sat · axial · 1.6mm · 0.82mm/px · z∈[-109,+94]mm · 7 of 128 slices shown (2 of 3)]
[im 1/128]
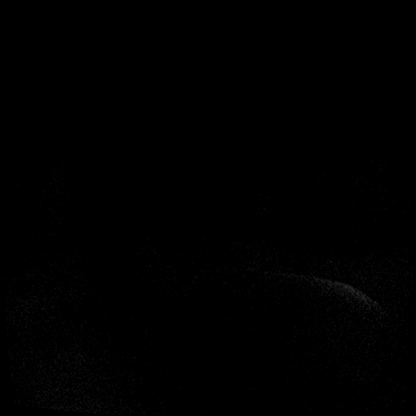
[im 22/128]
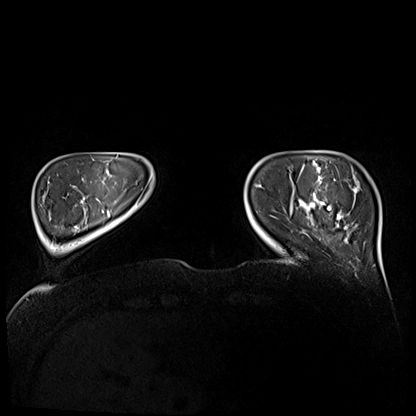
[im 43/128]
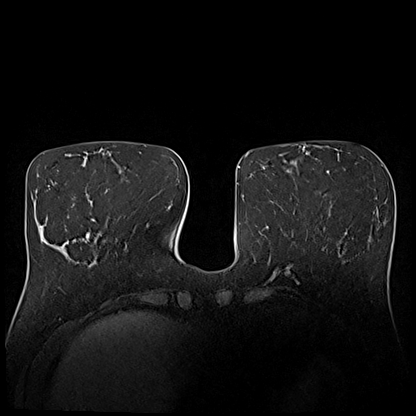
[im 64/128]
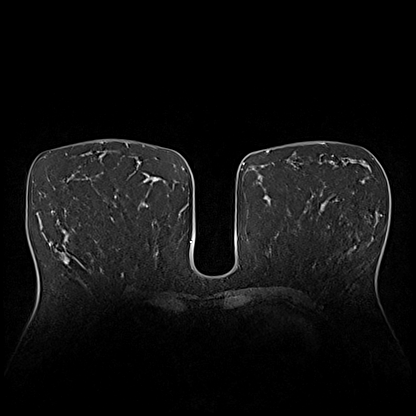
[im 85/128]
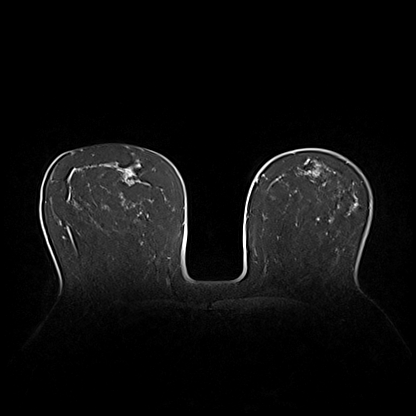
[im 106/128]
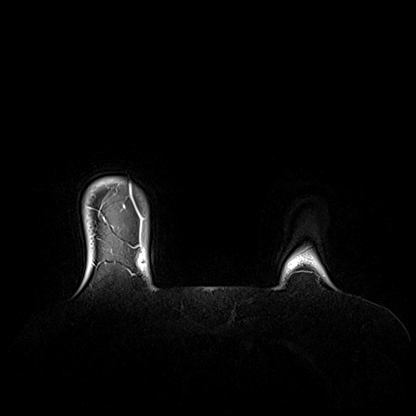
[im 128/128]
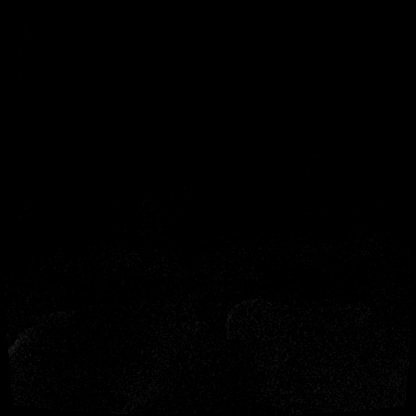

[Series 6: T1 fat-sat · axial · 1.6mm · 0.82mm/px · z∈[-109,+94]mm · 7 of 127 slices shown (3 of 3)]
[im 1/127]
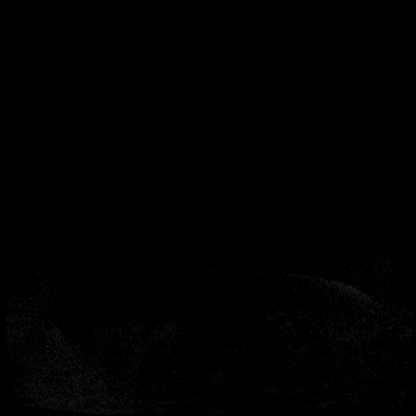
[im 22/127]
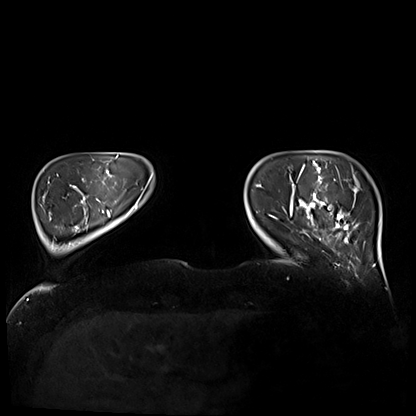
[im 43/127]
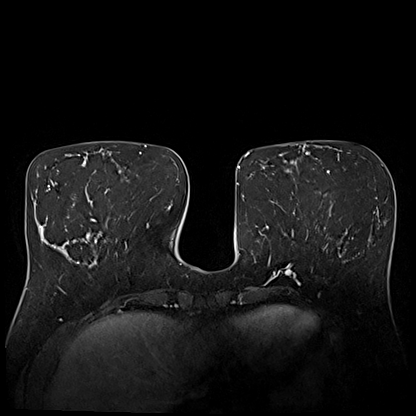
[im 64/127]
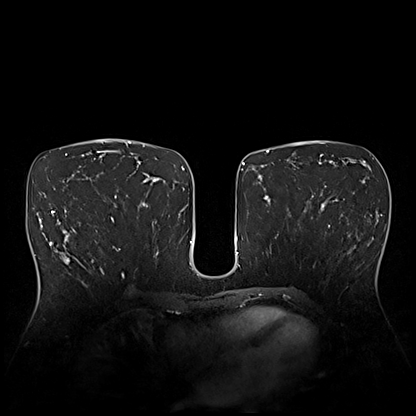
[im 85/127]
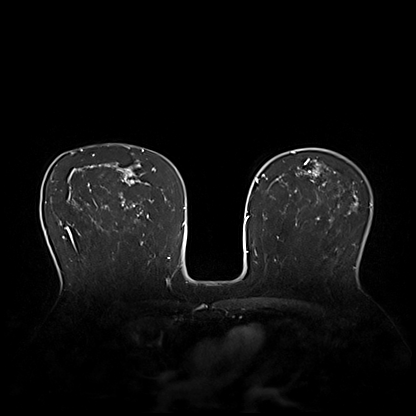
[im 106/127]
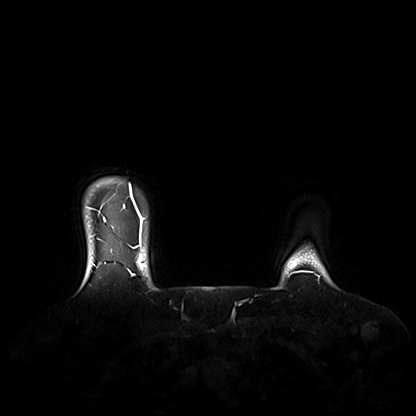
[im 127/127]
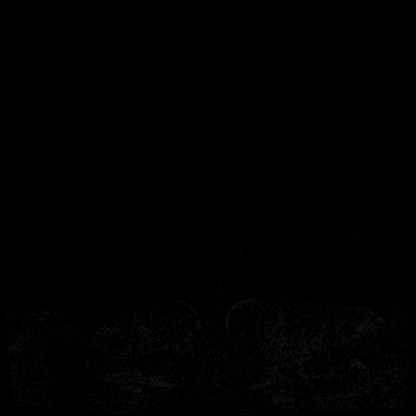

[Series 7: T1 · axial · 1.6mm · 0.82mm/px · z∈[-109,-8]mm · 4 of 127 slices shown]
[im 1/127]
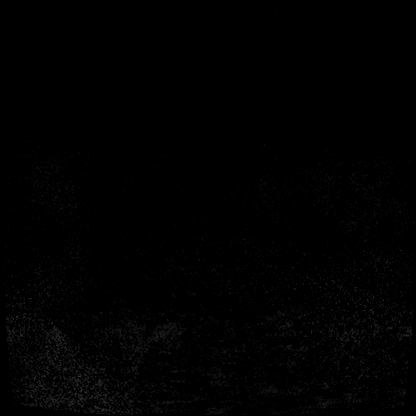
[im 22/127]
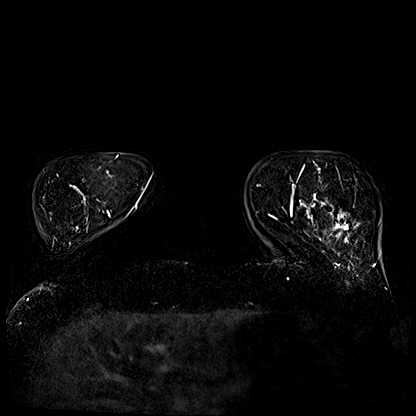
[im 43/127]
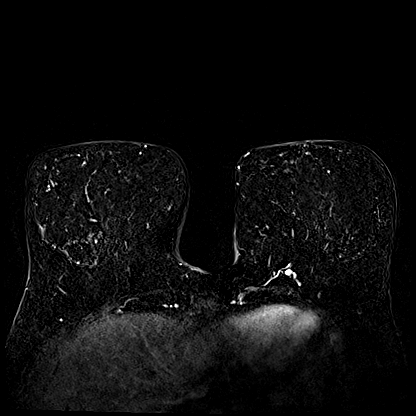
[im 64/127]
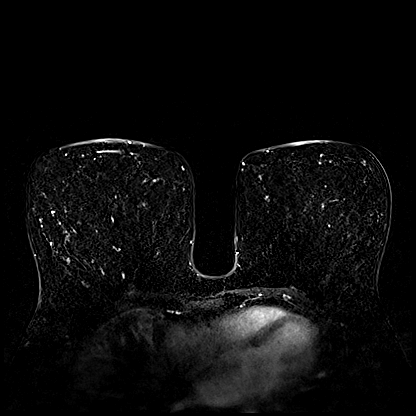

[29 of 48 positions shown; findings below may reference images not displayed]

Three-dimensional MR images were rendered by post-processing of the
original MR data on an independent workstation. The
three-dimensional MR images were interpreted, and findings are
reported in the following complete MRI report for this study. Three
dimensional images were evaluated at the independent interpreting
workstation using the DynaCAD thin client.
FINDINGS: Breast composition: b. Scattered fibroglandular tissue.

Background parenchymal enhancement: Moderate.

Right breast: No mass or abnormal enhancement.

Left breast: Irregular abnormal enhancement with 2 associated biopsy
clips (anterior, posterior portions) measuring 2.4 x 1.3 cm in the
lateral lower left breast is identified consistent with recently
biopsy breast cancer. There is a single eccentric thickened cortex
lymph node in the posteromedial lower left breast measuring 1.25 cm
(series 7, image 85.)

Lymph nodes: No abnormal appearing lymph nodes.

Ancillary findings:  None.
IMPRESSION: Irregular abnormal enhancement with 2 associated biopsy clips
(anterior, posterior portions) measuring 2.4 x 1.3 cm in the lateral
lower left breast is identified consistent with recently biopsy
breast cancer.

There is a single eccentric thickened cortex lymph node in the
posteromedial lower left breast measuring 1.25 cm.

RECOMMENDATION:
Recommend second-look ultrasound with possible biopsy ofsingle
eccentric thickened cortex lymph node in the posteromedial lower
left breast measuring 1.25 cm.

BI-RADS CATEGORY  4: Suspicious.

## 2020-10-16 MED ORDER — GADOBUTROL 1 MMOL/ML IV SOLN
7.0000 mL | Freq: Once | INTRAVENOUS | Status: AC | PRN
  Administered 2020-10-16: 7 mL via INTRAVENOUS

## 2020-10-16 NOTE — Progress Notes (Signed)
*  PRELIMINARY RESULTS* Echocardiogram 2D Echocardiogram has been performed with strain.  Luisa Hart RDCS 10/16/2020, 8:37 AM

## 2020-10-16 NOTE — Progress Notes (Signed)
Met with patient/accompanying adult at registration to introduce myself and to offer available resources.  Discussed one-time $1000 Radio broadcast assistant to assist with personal expenses while going through treatment.  Also, discussed available copay assistance for specific treatment drugs if she has not met her ded/OOP. Patient states she has not. She gave me permission to apply on her behalf.  Gave her my card if interested in applying for the grant and for any additional financial questions or concerns.

## 2020-10-17 ENCOUNTER — Telehealth: Payer: Self-pay | Admitting: *Deleted

## 2020-10-17 ENCOUNTER — Ambulatory Visit (HOSPITAL_BASED_OUTPATIENT_CLINIC_OR_DEPARTMENT_OTHER): Payer: 59 | Admitting: Anesthesiology

## 2020-10-17 ENCOUNTER — Other Ambulatory Visit: Payer: Self-pay

## 2020-10-17 ENCOUNTER — Encounter (HOSPITAL_BASED_OUTPATIENT_CLINIC_OR_DEPARTMENT_OTHER): Payer: Self-pay | Admitting: Surgery

## 2020-10-17 ENCOUNTER — Ambulatory Visit (HOSPITAL_COMMUNITY): Payer: 59

## 2020-10-17 ENCOUNTER — Encounter (HOSPITAL_BASED_OUTPATIENT_CLINIC_OR_DEPARTMENT_OTHER)
Admission: RE | Disposition: A | Discharge status: Home / Self Care | Payer: Self-pay | Source: Home / Self Care | Attending: Surgery

## 2020-10-17 ENCOUNTER — Other Ambulatory Visit: Payer: Self-pay | Admitting: *Deleted

## 2020-10-17 ENCOUNTER — Ambulatory Visit (HOSPITAL_BASED_OUTPATIENT_CLINIC_OR_DEPARTMENT_OTHER)
Admission: RE | Admit: 2020-10-17 | Discharge: 2020-10-17 | Disposition: A | Discharge status: Home / Self Care | Payer: 59 | Attending: Surgery | Admitting: Surgery

## 2020-10-17 DIAGNOSIS — C773 Secondary and unspecified malignant neoplasm of axilla and upper limb lymph nodes: Secondary | ICD-10-CM | POA: Diagnosis not present

## 2020-10-17 DIAGNOSIS — Z419 Encounter for procedure for purposes other than remedying health state, unspecified: Secondary | ICD-10-CM

## 2020-10-17 DIAGNOSIS — Z170 Estrogen receptor positive status [ER+]: Secondary | ICD-10-CM | POA: Insufficient documentation

## 2020-10-17 DIAGNOSIS — Z803 Family history of malignant neoplasm of breast: Secondary | ICD-10-CM | POA: Diagnosis not present

## 2020-10-17 DIAGNOSIS — Z452 Encounter for adjustment and management of vascular access device: Secondary | ICD-10-CM

## 2020-10-17 DIAGNOSIS — C50512 Malignant neoplasm of lower-outer quadrant of left female breast: Secondary | ICD-10-CM | POA: Diagnosis present

## 2020-10-17 HISTORY — PX: PORTACATH PLACEMENT: SHX2246

## 2020-10-17 HISTORY — DX: Nausea with vomiting, unspecified: R11.2

## 2020-10-17 HISTORY — DX: Other specified postprocedural states: Z98.890

## 2020-10-17 HISTORY — DX: Prediabetes: R73.03

## 2020-10-17 LAB — POCT PREGNANCY, URINE: Preg Test, Ur: NEGATIVE

## 2020-10-17 IMAGING — CR DG CHEST 1V PORT
1 series · 1 of 1 positions shown · non-contrast
Comparison: No prior.

CLINICAL DATA: Port-A-Cath placement.

EXAM:
PORTABLE CHEST 1 VIEW

[chest ap]
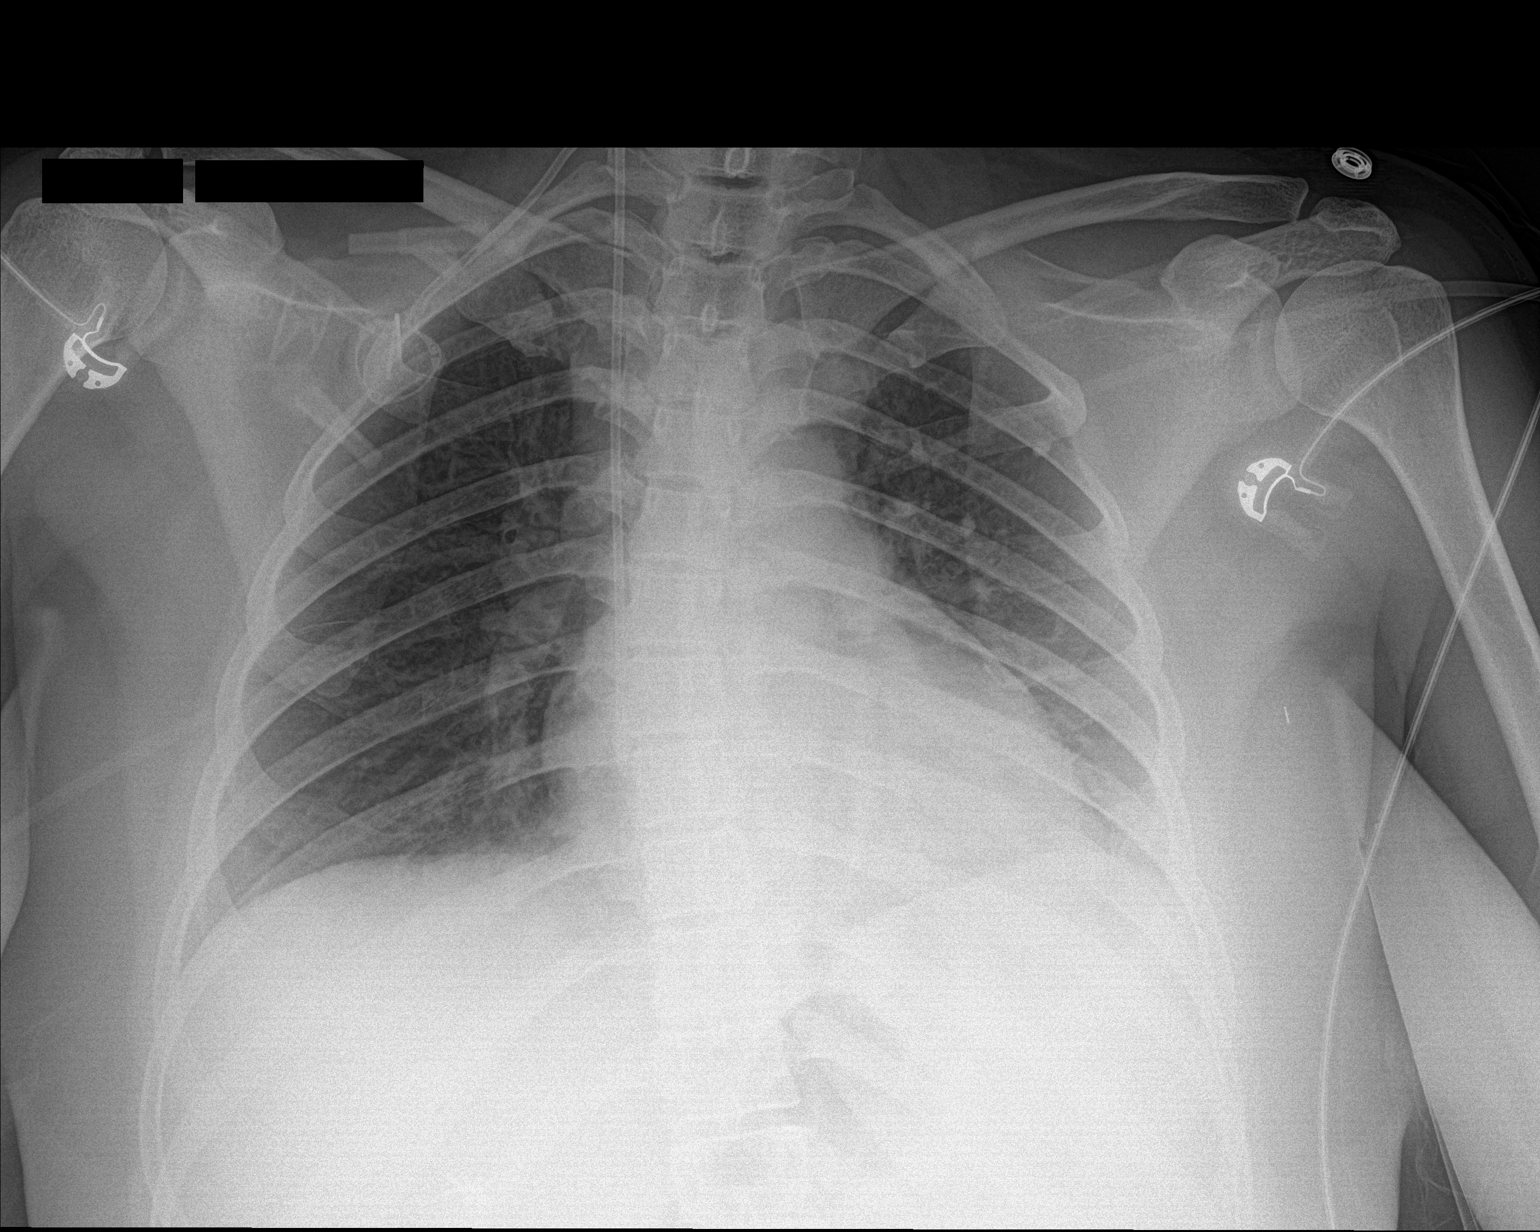

[1 of 1 positions shown; findings below may reference images not displayed]

FINDINGS: Port-A-Cath noted with tip over right atrium. Heart size normal. Low
lung volumes with mild bibasilar atelectasis. Mild left base
infiltrate cannot be completely excluded. No pleural effusion or
pneumothorax.
IMPRESSION: 1.  Port-A-Cath noted with tip over right atrium.  No pneumothorax.

2. Low lung volumes with mild bibasilar atelectasis. Mild left base
infiltrate cannot be excluded.

## 2020-10-17 SURGERY — INSERTION, TUNNELED CENTRAL VENOUS DEVICE, WITH PORT
Anesthesia: General | Site: Chest | Laterality: Right

## 2020-10-17 MED ORDER — CEFAZOLIN SODIUM-DEXTROSE 2-4 GM/100ML-% IV SOLN
INTRAVENOUS | Status: AC
  Filled 2020-10-17: qty 100

## 2020-10-17 MED ORDER — HYDROMORPHONE HCL 1 MG/ML IJ SOLN: 0.2500 mg | INTRAMUSCULAR | Status: DC | PRN

## 2020-10-17 MED ORDER — FENTANYL CITRATE (PF) 100 MCG/2ML IJ SOLN
INTRAMUSCULAR | Status: DC | PRN
  Administered 2020-10-17: 50 ug via INTRAVENOUS

## 2020-10-17 MED ORDER — LIDOCAINE 2% (20 MG/ML) 5 ML SYRINGE
INTRAMUSCULAR | Status: DC | PRN
  Administered 2020-10-17: 40 mg via INTRAVENOUS

## 2020-10-17 MED ORDER — OXYCODONE HCL 5 MG PO TABS: 5.0000 mg | ORAL_TABLET | Freq: Four times a day (QID) | ORAL | 0 refills | Status: DC | PRN

## 2020-10-17 MED ORDER — BUPIVACAINE-EPINEPHRINE 0.25% -1:200000 IJ SOLN
INTRAMUSCULAR | Status: DC | PRN
  Administered 2020-10-17: 10 mL

## 2020-10-17 MED ORDER — PROMETHAZINE HCL 25 MG/ML IJ SOLN: 6.2500 mg | INTRAMUSCULAR | Status: DC | PRN

## 2020-10-17 MED ORDER — ACETAMINOPHEN 500 MG PO TABS
1000.0000 mg | ORAL_TABLET | ORAL | Status: AC
  Administered 2020-10-17: 1000 mg via ORAL

## 2020-10-17 MED ORDER — MEPERIDINE HCL 25 MG/ML IJ SOLN: 6.2500 mg | INTRAMUSCULAR | Status: DC | PRN

## 2020-10-17 MED ORDER — DROPERIDOL 2.5 MG/ML IJ SOLN
INTRAMUSCULAR | Status: DC | PRN
  Administered 2020-10-17: .625 mg via INTRAVENOUS

## 2020-10-17 MED ORDER — HEPARIN SOD (PORK) LOCK FLUSH 100 UNIT/ML IV SOLN
INTRAVENOUS | Status: DC | PRN
  Administered 2020-10-17: 500 [IU] via INTRAVENOUS

## 2020-10-17 MED ORDER — MIDAZOLAM HCL 5 MG/5ML IJ SOLN
INTRAMUSCULAR | Status: DC | PRN
  Administered 2020-10-17: 2 mg via INTRAVENOUS

## 2020-10-17 MED ORDER — DROPERIDOL 2.5 MG/ML IJ SOLN
INTRAMUSCULAR | Status: AC
  Filled 2020-10-17: qty 2

## 2020-10-17 MED ORDER — ONDANSETRON HCL 4 MG/2ML IJ SOLN
INTRAMUSCULAR | Status: DC | PRN
  Administered 2020-10-17: 4 mg via INTRAVENOUS

## 2020-10-17 MED ORDER — LIDOCAINE 2% (20 MG/ML) 5 ML SYRINGE
INTRAMUSCULAR | Status: AC
  Filled 2020-10-17: qty 5

## 2020-10-17 MED ORDER — ACETAMINOPHEN 500 MG PO TABS
ORAL_TABLET | ORAL | Status: AC
  Filled 2020-10-17: qty 2

## 2020-10-17 MED ORDER — OXYCODONE HCL 5 MG/5ML PO SOLN
5.0000 mg | Freq: Once | ORAL | Status: DC | PRN
Start: 2020-10-17 — End: 2020-10-17

## 2020-10-17 MED ORDER — PROPOFOL 10 MG/ML IV BOLUS
INTRAVENOUS | Status: DC | PRN
  Administered 2020-10-17: 200 mg via INTRAVENOUS

## 2020-10-17 MED ORDER — LACTATED RINGERS IV SOLN: INTRAVENOUS | Status: DC

## 2020-10-17 MED ORDER — MIDAZOLAM HCL 2 MG/2ML IJ SOLN
INTRAMUSCULAR | Status: AC
  Filled 2020-10-17: qty 2

## 2020-10-17 MED ORDER — FENTANYL CITRATE (PF) 100 MCG/2ML IJ SOLN
INTRAMUSCULAR | Status: AC
  Filled 2020-10-17: qty 2

## 2020-10-17 MED ORDER — DEXAMETHASONE SODIUM PHOSPHATE 4 MG/ML IJ SOLN
INTRAMUSCULAR | Status: DC | PRN
  Administered 2020-10-17: 10 mg via INTRAVENOUS

## 2020-10-17 MED ORDER — OXYCODONE HCL 5 MG PO TABS
5.0000 mg | ORAL_TABLET | Freq: Once | ORAL | Status: DC | PRN
Start: 2020-10-17 — End: 2020-10-17

## 2020-10-17 MED ORDER — HEPARIN (PORCINE) IN NACL 2-0.9 UNITS/ML
INTRAMUSCULAR | Status: AC | PRN
  Administered 2020-10-17: 500 mL via INTRAVENOUS

## 2020-10-17 MED ORDER — CHLORHEXIDINE GLUCONATE CLOTH 2 % EX PADS: 6.0000 | MEDICATED_PAD | Freq: Once | CUTANEOUS | Status: DC

## 2020-10-17 MED ORDER — PHENYLEPHRINE HCL (PRESSORS) 10 MG/ML IV SOLN
INTRAVENOUS | Status: DC | PRN
  Administered 2020-10-17 (×3): 80 ug via INTRAVENOUS

## 2020-10-17 MED ORDER — CEFAZOLIN SODIUM-DEXTROSE 2-4 GM/100ML-% IV SOLN
2.0000 g | INTRAVENOUS | Status: AC
  Administered 2020-10-17: 2 g via INTRAVENOUS

## 2020-10-17 SURGICAL SUPPLY — 51 items
APL PRP STRL LF DISP 70% ISPRP (MISCELLANEOUS) ×1
APL SKNCLS STERI-STRIP NONHPOA (GAUZE/BANDAGES/DRESSINGS) ×2
BAG DECANTER FOR FLEXI CONT (MISCELLANEOUS) ×2 IMPLANT
BENZOIN TINCTURE PRP APPL 2/3 (GAUZE/BANDAGES/DRESSINGS) ×4 IMPLANT
BLADE SURG 11 STRL SS (BLADE) ×2 IMPLANT
BLADE SURG 15 STRL LF DISP TIS (BLADE) ×1 IMPLANT
BLADE SURG 15 STRL SS (BLADE) ×2
CANISTER SUCT 1200ML W/VALVE (MISCELLANEOUS) IMPLANT
CHLORAPREP W/TINT 26 (MISCELLANEOUS) ×2 IMPLANT
CLEANER CAUTERY TIP 5X5 PAD (MISCELLANEOUS) ×1 IMPLANT
COVER BACK TABLE 60X90IN (DRAPES) ×2 IMPLANT
COVER MAYO STAND STRL (DRAPES) ×2 IMPLANT
COVER PROBE 5X48 (MISCELLANEOUS) ×2
COVER WAND RF STERILE (DRAPES) IMPLANT
DECANTER SPIKE VIAL GLASS SM (MISCELLANEOUS) IMPLANT
DRAPE C-ARM 42X72 X-RAY (DRAPES) ×2 IMPLANT
DRAPE LAPAROTOMY TRNSV 102X78 (DRAPES) ×2 IMPLANT
DRAPE UTILITY XL STRL (DRAPES) ×2 IMPLANT
DRSG TEGADERM 4X4.75 (GAUZE/BANDAGES/DRESSINGS) ×2 IMPLANT
ELECT REM PT RETURN 9FT ADLT (ELECTROSURGICAL) ×2
ELECTRODE REM PT RTRN 9FT ADLT (ELECTROSURGICAL) ×1 IMPLANT
GAUZE SPONGE 4X4 12PLY STRL LF (GAUZE/BANDAGES/DRESSINGS) IMPLANT
GLOVE SURG ENC MOIS LTX SZ7 (GLOVE) ×2 IMPLANT
GLOVE SURG POLYISO LF SZ8 (GLOVE) ×2 IMPLANT
GLOVE SURG UNDER POLY LF SZ7 (GLOVE) ×2 IMPLANT
GLOVE SURG UNDER POLY LF SZ7.5 (GLOVE) ×2 IMPLANT
GOWN STRL REUS W/ TWL LRG LVL3 (GOWN DISPOSABLE) ×1 IMPLANT
GOWN STRL REUS W/TWL 2XL LVL3 (GOWN DISPOSABLE) ×2 IMPLANT
GOWN STRL REUS W/TWL LRG LVL3 (GOWN DISPOSABLE) ×2
IV KIT MINILOC 20X1 SAFETY (NEEDLE) IMPLANT
KIT CVR 48X5XPRB PLUP LF (MISCELLANEOUS) ×1 IMPLANT
KIT PORT POWER 8FR ISP CVUE (Port) ×2 IMPLANT
NDL SAFETY ECLIPSE 18X1.5 (NEEDLE) IMPLANT
NEEDLE HYPO 18GX1.5 SHARP (NEEDLE)
NEEDLE HYPO 25X1 1.5 SAFETY (NEEDLE) ×2 IMPLANT
NEEDLE SPNL 22GX3.5 QUINCKE BK (NEEDLE) IMPLANT
PACK BASIN DAY SURGERY FS (CUSTOM PROCEDURE TRAY) ×2 IMPLANT
PAD CLEANER CAUTERY TIP 5X5 (MISCELLANEOUS) ×1
PENCIL SMOKE EVACUATOR (MISCELLANEOUS) ×2 IMPLANT
SLEEVE SCD COMPRESS KNEE MED (STOCKING) ×2 IMPLANT
SPONGE GAUZE 2X2 8PLY STRL LF (GAUZE/BANDAGES/DRESSINGS) ×2 IMPLANT
STRIP CLOSURE SKIN 1/2X4 (GAUZE/BANDAGES/DRESSINGS) ×2 IMPLANT
SUT MON AB 4-0 PC3 18 (SUTURE) ×4 IMPLANT
SUT PROLENE 2 0 CT2 30 (SUTURE) ×4 IMPLANT
SUT VIC AB 3-0 SH 27 (SUTURE) ×4
SUT VIC AB 3-0 SH 27X BRD (SUTURE) ×2 IMPLANT
SYR 5ML LUER SLIP (SYRINGE) ×2 IMPLANT
SYR CONTROL 10ML LL (SYRINGE) ×2 IMPLANT
TOWEL GREEN STERILE FF (TOWEL DISPOSABLE) ×2 IMPLANT
TUBE CONNECTING 20X1/4 (TUBING) IMPLANT
YANKAUER SUCT BULB TIP NO VENT (SUCTIONS) IMPLANT

## 2020-10-17 NOTE — Discharge Instructions (Signed)
PORT-A-CATH: POST OP INSTRUCTIONS  Always review your discharge instruction sheet given to you by the facility where your surgery was performed.   1. A prescription for pain medication may be given to you upon discharge. Take your pain medication as prescribed, if needed. If narcotic pain medicine is not needed, then you make take acetaminophen (Tylenol) or ibuprofen (Advil) as needed.  2. Take your usually prescribed medications unless otherwise directed. 3. If you need a refill on your pain medication, please contact our office. All narcotic pain medicine now requires a paper prescription.  Phoned in and fax refills are no longer allowed by law.  Prescriptions will not be filled after 5 pm or on weekends.  4. You should follow a light diet for the remainder of the day after your procedure. 5. Most patients will experience some mild swelling and/or bruising in the area of the incision. It may take several days to resolve. 6. It is common to experience some constipation if taking pain medication after surgery. Increasing fluid intake and taking a stool softener (such as Colace) will usually help or prevent this problem from occurring. A mild laxative (Milk of Magnesia or Miralax) should be taken according to package directions if there are no bowel movements after 48 hours.  7. Unless discharge instructions indicate otherwise, you may remove your bandages 48 hours after surgery, and you may shower at that time. You may have steri-strips (small white skin tapes) in place directly over the incision.  These strips should be left on the skin for 7-10 days.  If your surgeon used Dermabond (skin glue) on the incision, you may shower in 24 hours.  The glue will flake off over the next 2-3 weeks.  8. If your port is left accessed at the end of surgery (needle left in port), the dressing cannot get wet and should only by changed by a healthcare professional. When the port is no longer accessed (when the  needle has been removed), follow step 7.   9. ACTIVITIES:  Limit activity involving your arms for the next 72 hours. Do no strenuous exercise or activity for 1 week. You may drive when you are no longer taking prescription pain medication, you can comfortably wear a seatbelt, and you can maneuver your car. 10.You may need to see your doctor in the office for a follow-up appointment.  Please       check with your doctor.  11.When you receive a new Port-a-Cath, you will get a product guide and        ID card.  Please keep them in case you need them.  WHEN TO CALL YOUR DOCTOR [phone]): 1. Fever over 101.0 2. Chills 3. Continued bleeding from incision 4. Increased redness and tenderness at the site 5. Shortness of breath, difficulty breathing   The clinic staff is available to answer your questions during regular business hours. Please don't hesitate to call and ask to speak to one of the nurses or medical assistants for clinical concerns. If you have a medical emergency, go to the nearest emergency room or call 911.  A surgeon from [facility] is always on call at the hospital.     For further information, please visit [email or web address]    Post Anesthesia Home Care Instructions  Activity: Get plenty of rest for the remainder of the day. A responsible individual must stay with you for 24 hours following the procedure.  For the next 24 hours, DO NOT: -Drive a  car -Paediatric nurse -Drink alcoholic beverages -Take any medication unless instructed by your physician -Make any legal decisions or sign important papers.  Meals: Start with liquid foods such as gelatin or soup. Progress to regular foods as tolerated. Avoid greasy, spicy, heavy foods. If nausea and/or vomiting occur, drink only clear liquids until the nausea and/or vomiting subsides. Call your physician if vomiting continues.  Special Instructions/Symptoms: Your throat may feel dry or sore  from the anesthesia or the breathing tube placed in your throat during surgery. If this causes discomfort, gargle with warm salt water. The discomfort should disappear within 24 hours.  If you had a scopolamine patch placed behind your ear for the management of post- operative nausea and/or vomiting:  1. The medication in the patch is effective for 72 hours, after which it should be removed.  Wrap patch in a tissue and discard in the trash. Wash hands thoroughly with soap and water. 2. You may remove the patch earlier than 72 hours if you experience unpleasant side effects which may include dry mouth, dizziness or visual disturbances. 3. Avoid touching the patch. Wash your hands with soap and water after contact with the patch.   No tylenol until after 2:30 today if needed.

## 2020-10-17 NOTE — Assessment & Plan Note (Signed)
of female, estrogen receptor positive ([place]) 10/01/2020:Screening mammogram showed a left breast mass and calcifications. Diagnostic mammogram and US showed a 1.7cm and 0.5cm mass at the 5 o'clock position in the left breast, with one 0.4cm abnormal right axillary lymph node. 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  Treatment Plan: 1. Neoadjuvant chemotherapy with TCH Perjeta 6 cycles followed by Herceptin Perjeta maintenance versus Kadcyla maintenance (based on response to neoadjuvant chemo) for 1 year 2. Followed by breast conserving surgery if possible with sentinel lymph node study 3. Followed by adjuvant radiation therapy  4.  Followed by antiestrogen therapy although ER is weak [facility] nausea study ------------------------------------------------------------------------------------------------------------------------- Current treatment: Cycle 1 day 1 TCHP Antiemetics were reviewed Chemotherapy consent obtained Chemotherapy education completed Echocardiogram 10/15/20: EF 60-65%  Return to clinic in one week for toxicity check

## 2020-10-17 NOTE — Anesthesia Preprocedure Evaluation (Signed)
Anesthesia Evaluation  Patient identified by MRN, date of birth, ID band Patient awake    Reviewed: Allergy & Precautions, NPO status , Patient's Chart, lab work & pertinent test results  History of Anesthesia Complications (+) PONV  Airway Mallampati: II  TM Distance: >3 FB Neck ROM: Full    Dental no notable dental hx.    Pulmonary neg pulmonary ROS,    Pulmonary exam normal breath sounds clear to auscultation       Cardiovascular hypertension, Pt. on medications negative cardio ROS Normal cardiovascular exam Rhythm:Regular Rate:Normal     Neuro/Psych  Headaches, Anxiety Depression negative psych ROS   GI/Hepatic negative GI ROS, Neg liver ROS,   Endo/Other  negative endocrine ROS  Renal/GU negative Renal ROS  negative genitourinary   Musculoskeletal  (+) Fibromyalgia -  Abdominal   Peds negative pediatric ROS (+)  Hematology negative hematology ROS (+)   Anesthesia Other Findings Breast Cancer  Reproductive/Obstetrics negative OB ROS                             Anesthesia Physical Anesthesia Plan  ASA: III  Anesthesia Plan: General   Post-op Pain Management:    Induction: Intravenous  PONV Risk Score and Plan: 4 or greater and Ondansetron, Dexamethasone, Midazolam, Droperidol and Treatment may vary due to age or medical condition  Airway Management Planned: LMA  Additional Equipment:   Intra-op Plan:   Post-operative Plan: Extubation in OR  Informed Consent: I have reviewed the patients History and Physical, chart, labs and discussed the procedure including the risks, benefits and alternatives for the proposed anesthesia with the patient or authorized representative who has indicated his/her understanding and acceptance.     Dental advisory given  Plan Discussed with: CRNA  Anesthesia Plan Comments:         Anesthesia Quick Evaluation

## 2020-10-17 NOTE — Anesthesia Procedure Notes (Signed)
Procedure Name: LMA Insertion Date/Time: 10/17/2020 9:58 AM Performed by: Maryella Shivers, CRNA Pre-anesthesia Checklist: Patient identified, Emergency Drugs available, Suction available and Patient being monitored Patient Re-evaluated:Patient Re-evaluated prior to induction Oxygen Delivery Method: Circle system utilized Preoxygenation: Pre-oxygenation with 100% oxygen Induction Type: IV induction Ventilation: Mask ventilation without difficulty LMA: LMA inserted LMA Size: 4.0 Number of attempts: 1 Airway Equipment and Method: Bite block Placement Confirmation: positive ETCO2 Tube secured with: Tape Dental Injury: Teeth and Oropharynx as per pre-operative assessment

## 2020-10-17 NOTE — Anesthesia Postprocedure Evaluation (Signed)
Anesthesia Post Note  Patient: Tammie [name]  Procedure(s) Performed: INSERTION PORT-A-CATH, ULTRASOUND GUIDED (Right Chest)     Patient location during evaluation: PACU Anesthesia Type: General Level of consciousness: awake and alert Pain management: pain level controlled Vital Signs Assessment: post-procedure vital signs reviewed and stable Respiratory status: spontaneous breathing, nonlabored ventilation and respiratory function stable Cardiovascular status: blood pressure returned to baseline and stable Postop Assessment: no apparent nausea or vomiting Anesthetic complications: no   No complications documented.  Last Vitals:  Vitals:   10/17/20 1200 10/17/20 1209  BP: 112/77 122/72  Pulse: 67 74  Resp: 17 16  Temp:  36.7 C  SpO2: 97% 97%    Last Pain:  Vitals:   10/17/20 1209  TempSrc:   PainSc: 0-No pain                 Lynda Rainwater

## 2020-10-17 NOTE — Op Note (Signed)
Preop diagnosis: Left breast cancer Postop diagnosis: Same Procedure performed: Ultrasound guided right internal jugular vein port placement Surgeon:Seaton Hofmann K Jakaree Pickard Anesthesia: General via LMA Indications: This is a 45 year old female with a family history of breast cancer in her mother at age 32 who presents with screening detected mass in left lower outer quadrant. Imaging showed a 1.7 x 1.1 x 1.3 cm mass at 0500 6 cmfn with a nearby 0.5 cm mass. US showed a single abnormal lymph node. Both masses and the lymph node were biopsied. The masses are both invasive ductal carcinoma grade 3 with DCIS, ER/ PR positive, Her 2 positive, Ki67 20%. The lymph node was positive for metastatic carcinoma. No previous breast surgery or breast problems.    Description of procedure: The patient is brought to the operating room placed in the supine position on the operating table.  After an adequate level of general anesthesia was obtained, the patient right arm was tucked at her side.  Her right chest and neck were prepped with ChloraPrep and draped sterile fashion.  A timeout was taken to ensure the proper patient and proper procedure.  She was placed in Trendelenburg position.  We interrogated her neck with the ultrasound.  The jugular vein is easily identified.  Using ultrasound guidance we directly cannulated the internal jugular vein with good blood return.  The wire passed easily.  Fluoroscopy confirmed that the wire headed down the right side of the mediastinum.  The needle was removed.  We created a subcutaneous pocket below the right clavicle.  We first anesthetized with local anesthetic.  We created a subcutaneous tunnel from the subcutaneous pocket to the insertion site on the neck.  An 8 French [place] port was assembled and was tunneled from the subcutaneous pocket to the insertion site.  The catheter was cut to the appropriate length using fluoroscopic guidance.  Using fluoroscopic guidance, we passed  the dilator and breakaway sheath over the wire.  The wire and dilator were removed.  The catheter was then advanced through the sheath which was removed.  Fluoroscopy confirmed that there were no kinks along the length of the catheter.  We are able to aspirate blood easily through the port and were able to flush easily.  The port was secured with two interrupted 2-0 Prolene sutures.  3-0 Vicryl was used to close the subcutaneous tissue and 4-0 Monocryl was used to close the skin at both sites.    We accessed the port and tried to aspirate blood.  I was unable to aspirate from the catheter.  I opened the incision and cut the stay sutures for the port.  We pulled it back by a centimeter and I was able to aspirate and flush easily.  I then detached the port from the catheter, removed 1 cm of catheter, and reattached the port to the catheter.  We secured the port again with 2-0 Prolene.  I closed the wound again with Vicryl and Monocryl.  We accessed the port and I was able to aspirate and flush with concentrated heparin.  Benzoin and Steri-Strips were applied.  An occlusive dressing was placed.  The patient was then extubated and brought to the recovery room in stable condition.  All sponge, instrument, and needle counts are correct.  Post-op chest x-ray is pending.  Imogene Burn. Georgette Dover, MD, [facility] Surgery  General/ Trauma Surgery   10/17/2020 11:26 AM

## 2020-10-17 NOTE — Progress Notes (Signed)
Patient Care Team: [name], FNP as PCP - General (Family Medicine) Rockwell Germany, RN as Oncology Nurse Navigator Mauro Kaufmann, RN as Oncology Nurse Navigator Donnie Mesa, MD as Consulting Physician (General Surgery) Nicholas Lose, MD as Consulting Physician (Hematology and Oncology) Gery Pray, MD as Consulting Physician (Radiation Oncology)  DIAGNOSIS:    ICD-10-CM   1. Malignant neoplasm of lower-outer quadrant of left breast of female, estrogen receptor positive ([place])  C50.512    Z17.0     SUMMARY OF ONCOLOGIC HISTORY: Oncology History  Malignant neoplasm of lower-outer quadrant of left breast of female, estrogen receptor positive ([place])  10/01/2020 Initial Diagnosis   Screening mammogram showed a left breast mass and calcifications. Diagnostic mammogram and US showed a 1.7cm and 0.5cm mass at the 5 o'clock position in the left breast, with one 0.4cm abnormal right axillary lymph node. 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   10/09/2020 Cancer Staging   Staging form: Breast, AJCC 8th Edition - Clinical stage from 10/09/2020: Stage IB (cT2, cN1, cM0, G3, ER+, PR+, HER2+) - Signed by Nicholas Lose, MD on 10/09/2020 Stage prefix: Initial diagnosis Histologic grading system: 3 grade system Percentage of positive estrogen receptors (%): 5 Percentage of positive progesterone receptors (%): 1 Ki-67 (%): 20   10/18/2020 -  Chemotherapy    Patient is on Treatment Plan: BREAST  DOCETAXEL + CARBOPLATIN + TRASTUZUMAB + PERTUZUMAB  (TCHP) Q21D         CHIEF COMPLIANT: Cycle 1 TCH Perjeta  INTERVAL HISTORY: Tammie [name] is a 45 y.o. with above-mentioned history of left breast cancer currently on neoadjuvant chemotherapy with [place]. Echo on 10/16/20 showed an ejection fraction of 60-65%. Breast MRI on 10/16/20 showed the 2 known sites of malignancy in the left breast measuring 2.4cm and a  single thickened lymph nodes in the lower left breast, 1.25cm. She presents to the clinic today for cycle 1.   ALLERGIES:  has No Known Allergies.  MEDICATIONS:  Current Outpatient Medications  Medication Sig Dispense Refill  . Ascorbic Acid (VITAMIN C) 1000 MG tablet Take 1,000 mg by mouth daily.    . Cholecalciferol (DIALYVITE VITAMIN D 5000) 125 MCG (5000 UT) capsule Take 5,000 Units by mouth daily.    . citalopram (CELEXA) 20 MG tablet Take 20 mg by mouth daily.    Marland Kitchen dexamethasone (DECADRON) 4 MG tablet Take 1 tablet (4 mg total) by mouth 2 (two) times daily. Take 1 tablet day before chemo and 1 tablet day after chemo with food 12 tablet 0  . ibuprofen (ADVIL) 800 MG tablet Take 800 mg by mouth 2 (two) times daily.    Marland Kitchen lidocaine-prilocaine (EMLA) cream Apply to affected area once 30 g 3  . lisinopril (ZESTRIL) 20 MG tablet Take 1 tablet by mouth daily.    . magnesium gluconate (MAGONATE) 500 MG tablet Take 500 mg by mouth 2 (two) times daily.    . metoprolol tartrate (LOPRESSOR) 25 MG tablet Take 25 mg by mouth daily.    . NON FORMULARY Take 900 mg by mouth daily. Hyaluronic Acid Complex    . ondansetron (ZOFRAN) 8 MG tablet Take 1 tablet (8 mg total) by mouth 2 (two) times daily as needed (Nausea or vomiting). Start on the third day after chemotherapy. 30 tablet 1  . OVER THE COUNTER MEDICATION Take 4 capsules by mouth daily. Laminine supplement- 620 mg    .  oxyCODONE (OXY IR/ROXICODONE) 5 MG immediate release tablet Take 1 tablet (5 mg total) by mouth every 6 (six) hours as needed for severe pain. 15 tablet 0  . prochlorperazine (COMPAZINE) 10 MG tablet Take 1 tablet (10 mg total) by mouth every 6 (six) hours as needed (Nausea or vomiting). 30 tablet 1  . terbinafine (LAMISIL) 250 MG tablet Take 250 mg by mouth daily.     No current facility-administered medications for this visit.    PHYSICAL EXAMINATION: ECOG PERFORMANCE STATUS: 1 - Symptomatic but completely ambulatory  Vitals:    10/18/20 0759  BP: (!) 121/57  Pulse: 83  Resp: 16  Temp: 97.7 F (36.5 C)  SpO2: 100%   Filed Weights   10/18/20 0759  Weight: 163 lb 11.2 oz (74.3 kg)     LABORATORY DATA:  I have reviewed the data as listed CMP Latest Ref Rng & Units 10/09/2020  Glucose 70 - 99 mg/dL 106(H)  BUN 6 - 20 mg/dL 18  Creatinine 0.44 - 1.00 mg/dL 0.73  Sodium 135 - 145 mmol/L 139  Potassium 3.5 - 5.1 mmol/L 3.8  Chloride 98 - 111 mmol/L 102  CO2 22 - 32 mmol/L 27  Calcium 8.9 - 10.3 mg/dL 9.3  Total Protein 6.5 - 8.1 g/dL 7.3  Total Bilirubin 0.3 - 1.2 mg/dL 0.3  Alkaline Phos 38 - 126 U/L 48  AST 15 - 41 U/L 14(L)  ALT 0 - 44 U/L 12    Lab Results  Component Value Date   WBC 18.0 (H) 10/18/2020   HGB 11.8 (L) 10/18/2020   HCT 35.5 (L) 10/18/2020   MCV 88.5 10/18/2020   PLT 244 10/18/2020   NEUTROABS 14.6 (H) 10/18/2020    ASSESSMENT & PLAN:  Malignant neoplasm of lower-outer quadrant of left breast of female, estrogen receptor positive ([place]) of female, estrogen receptor positive ([place]) 10/01/2020:Screening mammogram showed a left breast mass and calcifications. Diagnostic mammogram and US showed a 1.7cm and 0.5cm mass at the 5 o'clock position in the left breast, with one 0.4cm abnormal right axillary lymph node. 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  Treatment Plan: 1. Neoadjuvant chemotherapy with TCH Perjeta 6 cycles followed by Herceptin Perjeta maintenance versus Kadcyla maintenance (based on response to neoadjuvant chemo) for 1 year 2. Followed by breast conserving surgery if possible with sentinel lymph node study 3. Followed by adjuvant radiation therapy  4.  Followed by antiestrogen therapy although ER is weak URCC nausea study Breast MRI: 10/17/20: 2.4 cm left breast mass, intramammary lymph node (needs second look ultrasound and  biopsy) ------------------------------------------------------------------------------------------------------------------------- Current treatment: Cycle 1 day 1 TCHP Antiemetics were reviewed Chemotherapy consent obtained Chemotherapy education completed Echocardiogram 10/15/20: EF 60-65%  Return to clinic in one week for toxicity check       No orders of the defined types were placed in this encounter.  The patient has a good understanding of the overall plan. she agrees with it. she will call with any problems that may develop before the next visit here.  Total time spent: 30 mins including face to face time and time spent for planning, charting and coordination of care  Rulon Eisenmenger, MD, MPH 10/18/2020  I, Cloyde Reams Dorshimer, am acting as scribe for Dr. Nicholas Lose.  I have reviewed the above documentation for accuracy and completeness, and I agree with the above.

## 2020-10-17 NOTE — Telephone Encounter (Signed)
[facility] left a voice mail stating approval for CPT: [id number], [id number], [id number], [id number] . All approved for 10/18/20- 01/16/21.  CPT: [id number], [id number] and [id number] do not need PA.  Auth # [id number].  For any questions call [phone]

## 2020-10-17 NOTE — Transfer of Care (Signed)
Immediate Anesthesia Transfer of Care Note  Patient: Tammie [name]  Procedure(s) Performed: INSERTION PORT-A-CATH, ULTRASOUND GUIDED (Right Chest)  Patient Location: PACU  Anesthesia Type:General  Level of Consciousness: drowsy  Airway & Oxygen Therapy: Patient Spontanous Breathing and Patient connected to face mask oxygen  Post-op Assessment: Report given to RN and Post -op Vital signs reviewed and stable  Post vital signs: Reviewed and stable  Last Vitals:  Vitals Value Taken Time  BP 121/81 10/17/20 1123  Temp    Pulse 88 10/17/20 1125  Resp 17 10/17/20 1125  SpO2 100 % 10/17/20 1125  Vitals shown include unvalidated device data.  Last Pain:  Vitals:   10/17/20 0827  TempSrc: Oral  PainSc: 0-No pain      Patients Stated Pain Goal: 3 (94/85/46 2703)  Complications: No complications documented.

## 2020-10-17 NOTE — Interval H&P Note (Signed)
History and Physical Interval Note:  10/17/2020 8:37 AM  Tammie [name]  has presented today for surgery, with the diagnosis of LEFT BREAST CANCER.  The various methods of treatment have been discussed with the patient and family. After consideration of risks, benefits and other options for treatment, the patient has consented to  Procedure(s): INSERTION PORT-A-CATH (N/A) as a surgical intervention.  The patient's history has been reviewed, patient examined, no change in status, stable for surgery.  I have reviewed the patient's chart and labs.  Questions were answered to the patient's satisfaction.     Maia Petties

## 2020-10-18 ENCOUNTER — Ambulatory Visit: Payer: 59

## 2020-10-18 ENCOUNTER — Inpatient Hospital Stay (HOSPITAL_BASED_OUTPATIENT_CLINIC_OR_DEPARTMENT_OTHER): Payer: 59 | Admitting: Hematology and Oncology

## 2020-10-18 ENCOUNTER — Encounter (HOSPITAL_BASED_OUTPATIENT_CLINIC_OR_DEPARTMENT_OTHER): Payer: Self-pay | Admitting: Surgery

## 2020-10-18 ENCOUNTER — Encounter: Payer: Self-pay | Admitting: *Deleted

## 2020-10-18 ENCOUNTER — Inpatient Hospital Stay: Payer: 59

## 2020-10-18 VITALS — BP 110/59 | HR 90 | Temp 97.8°F | Resp 18

## 2020-10-18 DIAGNOSIS — Z79899 Other long term (current) drug therapy: Secondary | ICD-10-CM | POA: Diagnosis not present

## 2020-10-18 DIAGNOSIS — Z5111 Encounter for antineoplastic chemotherapy: Secondary | ICD-10-CM | POA: Diagnosis present

## 2020-10-18 DIAGNOSIS — Z170 Estrogen receptor positive status [ER+]: Secondary | ICD-10-CM | POA: Diagnosis not present

## 2020-10-18 DIAGNOSIS — C50512 Malignant neoplasm of lower-outer quadrant of left female breast: Secondary | ICD-10-CM

## 2020-10-18 LAB — CBC WITH DIFFERENTIAL/PLATELET
Abs Immature Granulocytes: 0.08 10*3/uL — ABNORMAL HIGH (ref 0.00–0.07)
Basophils Absolute: 0 10*3/uL (ref 0.0–0.1)
Basophils Relative: 0 %
Eosinophils Absolute: 0 10*3/uL (ref 0.0–0.5)
Eosinophils Relative: 0 %
HCT: 35.5 % — ABNORMAL LOW (ref 36.0–46.0)
Hemoglobin: 11.8 g/dL — ABNORMAL LOW (ref 12.0–15.0)
Immature Granulocytes: 0 %
Lymphocytes Relative: 11 %
Lymphs Abs: 2 10*3/uL (ref 0.7–4.0)
MCH: 29.4 pg (ref 26.0–34.0)
MCHC: 33.2 g/dL (ref 30.0–36.0)
MCV: 88.5 fL (ref 80.0–100.0)
Monocytes Absolute: 1.4 10*3/uL — ABNORMAL HIGH (ref 0.1–1.0)
Monocytes Relative: 8 %
Neutro Abs: 14.6 10*3/uL — ABNORMAL HIGH (ref 1.7–7.7)
Neutrophils Relative %: 81 %
Platelets: 244 10*3/uL (ref 150–400)
RBC: 4.01 MIL/uL (ref 3.87–5.11)
RDW: 13.7 % (ref 11.5–15.5)
WBC: 18 10*3/uL — ABNORMAL HIGH (ref 4.0–10.5)
nRBC: 0 % (ref 0.0–0.2)

## 2020-10-18 LAB — COMPREHENSIVE METABOLIC PANEL
ALT: 9 U/L (ref 0–44)
AST: 14 U/L — ABNORMAL LOW (ref 15–41)
Albumin: 3.9 g/dL (ref 3.5–5.0)
Alkaline Phosphatase: 45 U/L (ref 38–126)
Anion gap: 11 (ref 5–15)
BUN: 11 mg/dL (ref 6–20)
CO2: 24 mmol/L (ref 22–32)
Calcium: 9 mg/dL (ref 8.9–10.3)
Chloride: 105 mmol/L (ref 98–111)
Creatinine, Ser: 0.76 mg/dL (ref 0.44–1.00)
GFR, Estimated: >60 mL/min (ref >60)
Glucose, Bld: 136 mg/dL — ABNORMAL HIGH (ref 70–99)
Potassium: 4 mmol/L (ref 3.5–5.1)
Sodium: 140 mmol/L (ref 135–145)
Total Bilirubin: 0.3 mg/dL (ref 0.3–1.2)
Total Protein: 6.9 g/dL (ref 6.5–8.1)

## 2020-10-18 MED ORDER — PALONOSETRON HCL INJECTION 0.25 MG/5ML
INTRAVENOUS | Status: AC
  Filled 2020-10-18: qty 5

## 2020-10-18 MED ORDER — ACETAMINOPHEN 325 MG PO TABS
650.0000 mg | ORAL_TABLET | Freq: Once | ORAL | Status: AC
  Administered 2020-10-18: 650 mg via ORAL

## 2020-10-18 MED ORDER — DIPHENHYDRAMINE HCL 25 MG PO CAPS
50.0000 mg | ORAL_CAPSULE | Freq: Once | ORAL | Status: AC
  Administered 2020-10-18: 50 mg via ORAL

## 2020-10-18 MED ORDER — DIPHENHYDRAMINE HCL 25 MG PO CAPS
ORAL_CAPSULE | ORAL | Status: AC
  Filled 2020-10-18: qty 2

## 2020-10-18 MED ORDER — SODIUM CHLORIDE 0.9 % IV SOLN
840.0000 mg | Freq: Once | INTRAVENOUS | Status: AC
  Administered 2020-10-18: 840 mg via INTRAVENOUS
  Filled 2020-10-18: qty 28

## 2020-10-18 MED ORDER — HEPARIN SOD (PORK) LOCK FLUSH 100 UNIT/ML IV SOLN
500.0000 [IU] | Freq: Once | INTRAVENOUS | Status: AC | PRN
  Administered 2020-10-18: 500 [IU]
  Filled 2020-10-18: qty 5

## 2020-10-18 MED ORDER — ACETAMINOPHEN 325 MG PO TABS
ORAL_TABLET | ORAL | Status: AC
  Filled 2020-10-18: qty 2

## 2020-10-18 MED ORDER — SODIUM CHLORIDE 0.9 % IV SOLN
700.0000 mg | Freq: Once | INTRAVENOUS | Status: AC
Start: 2020-10-18 — End: 2020-10-18
  Administered 2020-10-18: 700 mg via INTRAVENOUS
  Filled 2020-10-18: qty 70

## 2020-10-18 MED ORDER — SODIUM CHLORIDE 0.9 % IV SOLN
10.0000 mg | Freq: Once | INTRAVENOUS | Status: AC
  Administered 2020-10-18: 10 mg via INTRAVENOUS
  Filled 2020-10-18: qty 10

## 2020-10-18 MED ORDER — SODIUM CHLORIDE 0.9 % IV SOLN
Freq: Once | INTRAVENOUS | Status: AC
  Filled 2020-10-18: qty 250

## 2020-10-18 MED ORDER — PALONOSETRON HCL INJECTION 0.25 MG/5ML
0.2500 mg | Freq: Once | INTRAVENOUS | Status: AC
  Administered 2020-10-18: 0.25 mg via INTRAVENOUS

## 2020-10-18 MED ORDER — TRASTUZUMAB-ANNS CHEMO 150 MG IV SOLR
600.0000 mg | Freq: Once | INTRAVENOUS | Status: AC
  Administered 2020-10-18: 600 mg via INTRAVENOUS
  Filled 2020-10-18: qty 28.57

## 2020-10-18 MED ORDER — SODIUM CHLORIDE 0.9 % IV SOLN
150.0000 mg | Freq: Once | INTRAVENOUS | Status: AC
  Administered 2020-10-18: 150 mg via INTRAVENOUS
  Filled 2020-10-18: qty 150

## 2020-10-18 MED ORDER — DOCETAXEL CHEMO INJECTION 160 MG/16ML
75.0000 mg/m2 | Freq: Once | INTRAVENOUS | Status: AC
  Administered 2020-10-18: 140 mg via INTRAVENOUS
  Filled 2020-10-18: qty 14

## 2020-10-18 MED ORDER — SODIUM CHLORIDE 0.9% FLUSH
10.0000 mL | INTRAVENOUS | Status: DC | PRN
  Administered 2020-10-18: 10 mL
  Filled 2020-10-18: qty 10

## 2020-10-18 NOTE — Patient Instructions (Signed)
[facility]  Discharge Instructions: Thank you for choosing [facility] to provide your oncology and hematology care.   If you have a lab appointment with the [facility], please go directly to the [facility] and check in at the registration area.   Wear comfortable clothing and clothing appropriate for easy access to any Portacath or PICC line.   We strive to give you quality time with your provider. You may need to reschedule your appointment if you arrive late (15 or more minutes).  Arriving late affects you and other patients whose appointments are after yours.  Also, if you miss three or more appointments without notifying the office, you may be dismissed from the clinic at the provider's discretion.      For prescription refill requests, have your pharmacy contact our office and allow 72 hours for refills to be completed.    Today you received the following chemotherapy and/or immunotherapy agents trastuzumab, pertuzumab, taxotere, carboplatin   To help prevent nausea and vomiting after your treatment, we encourage you to take your nausea medication as directed.  BELOW ARE SYMPTOMS THAT SHOULD BE REPORTED IMMEDIATELY: . *FEVER GREATER THAN 100.4 F (38 C) OR HIGHER . *CHILLS OR SWEATING . *NAUSEA AND VOMITING THAT IS NOT CONTROLLED WITH YOUR NAUSEA MEDICATION . *UNUSUAL SHORTNESS OF BREATH . *UNUSUAL BRUISING OR BLEEDING . *URINARY PROBLEMS (pain or burning when urinating, or frequent urination) . *BOWEL PROBLEMS (unusual diarrhea, constipation, pain near the anus) . TENDERNESS IN MOUTH AND THROAT WITH OR WITHOUT PRESENCE OF ULCERS (sore throat, sores in mouth, or a toothache) . UNUSUAL RASH, SWELLING OR PAIN  . UNUSUAL VAGINAL DISCHARGE OR ITCHING   Items with * indicate a potential emergency and should be followed up as soon as possible or go to the Emergency Department if any problems should occur.  Please show the CHEMOTHERAPY  ALERT CARD or IMMUNOTHERAPY ALERT CARD at check-in to the Emergency Department and triage nurse.  Should you have questions after your visit or need to cancel or reschedule your appointment, please contact [facility]  Dept: [phone]  and follow the prompts.  Office hours are 8:00 a.m. to 4:30 p.m. Monday - Friday. Please note that voicemails left after 4:00 p.m. may not be returned until the following business day.  We are closed weekends and major holidays. You have access to a nurse at all times for urgent questions. Please call the main number to the clinic Dept: [phone] and follow the prompts.   For any non-urgent questions, you may also contact your provider using MyChart. We now offer e-Visits for anyone 8 and older to request care online for non-urgent symptoms. For details visit [email or web address].   Also download the MyChart app! Go to the app store, search "MyChart", open the app, select [facility], and log in with your MyChart username and password.  Due to Covid, a mask is required upon entering the hospital/clinic. If you do not have a mask, one will be given to you upon arrival. For doctor visits, patients may have 1 support person aged 74 or older with them. For treatment visits, patients cannot have anyone with them due to current Covid guidelines and our immunocompromised population.    Pertuzumab injection What is this medicine? PERTUZUMAB (per TOOZ ue mab) is a monoclonal antibody. It is used to treat breast cancer. This medicine may be used for other purposes; ask your health care provider or pharmacist if  you have questions. COMMON BRAND NAME(S): PERJETA What should I tell my health care provider before I take this medicine? They need to know if you have any of these conditions:  heart disease  heart failure  high blood pressure  history of irregular heart beat  recent or ongoing radiation therapy  an unusual or  allergic reaction to pertuzumab, other medicines, foods, dyes, or preservatives  pregnant or trying to get pregnant  breast-feeding How should I use this medicine? This medicine is for infusion into a vein. It is given by a health care professional in a hospital or clinic setting. Talk to your pediatrician regarding the use of this medicine in children. Special care may be needed. Overdosage: If you think you have taken too much of this medicine contact a poison control center or emergency room at once. NOTE: This medicine is only for you. Do not share this medicine with others. What if I miss a dose? It is important not to miss your dose. Call your doctor or health care professional if you are unable to keep an appointment. What may interact with this medicine? Interactions are not expected. Give your health care provider a list of all the medicines, herbs, non-prescription drugs, or dietary supplements you use. Also tell them if you smoke, drink alcohol, or use illegal drugs. Some items may interact with your medicine. This list may not describe all possible interactions. Give your health care provider a list of all the medicines, herbs, non-prescription drugs, or dietary supplements you use. Also tell them if you smoke, drink alcohol, or use illegal drugs. Some items may interact with your medicine. What should I watch for while using this medicine? Your condition will be monitored carefully while you are receiving this medicine. Report any side effects. Continue your course of treatment even though you feel ill unless your doctor tells you to stop. Do not become pregnant while taking this medicine or for 7 months after stopping it. Women should inform their doctor if they wish to become pregnant or think they might be pregnant. Women of child-bearing potential will need to have a negative pregnancy test before starting this medicine. There is a potential for serious side effects to an unborn  child. Talk to your health care professional or pharmacist for more information. Do not breast-feed an infant while taking this medicine or for 7 months after stopping it. Women must use effective birth control with this medicine. Call your doctor or health care professional for advice if you get a fever, chills or sore throat, or other symptoms of a cold or flu. Do not treat yourself. Try to avoid being around people who are sick. You may experience fever, chills, and headache during the infusion. Report any side effects during the infusion to your health care professional. What side effects may I notice from receiving this medicine? Side effects that you should report to your doctor or health care professional as soon as possible:  breathing problems  chest pain or palpitations  dizziness  feeling faint or lightheaded  fever or chills  skin rash, itching or hives  sore throat  swelling of the face, lips, or tongue  swelling of the legs or ankles  unusually weak or tired Side effects that usually do not require medical attention (report to your doctor or health care professional if they continue or are bothersome):  diarrhea  hair loss  nausea, vomiting  tiredness This list may not describe all possible side effects. Call your  doctor for medical advice about side effects. You may report side effects to FDA at 1-800-FDA-1088. Where should I keep my medicine? This drug is given in a hospital or clinic and will not be stored at home. NOTE: This sheet is a summary. It may not cover all possible information. If you have questions about this medicine, talk to your doctor, pharmacist, or health care provider.  2021 Elsevier/Gold Standard (2015-07-04 12:08:50) Trastuzumab injection for infusion What is this medicine? TRASTUZUMAB (tras TOO zoo mab) is a monoclonal antibody. It is used to treat breast cancer and stomach cancer. This medicine may be used for other purposes; ask your  health care provider or pharmacist if you have questions. COMMON BRAND NAME(S): Herceptin, Galvin Proffer, Trazimera What should I tell my health care provider before I take this medicine? They need to know if you have any of these conditions:  heart disease  heart failure  lung or breathing disease, like asthma  an unusual or allergic reaction to trastuzumab, benzyl alcohol, or other medications, foods, dyes, or preservatives  pregnant or trying to get pregnant  breast-feeding How should I use this medicine? This drug is given as an infusion into a vein. It is administered in a hospital or clinic by a specially trained health care professional. Talk to your pediatrician regarding the use of this medicine in children. This medicine is not approved for use in children. Overdosage: If you think you have taken too much of this medicine contact a poison control center or emergency room at once. NOTE: This medicine is only for you. Do not share this medicine with others. What if I miss a dose? It is important not to miss a dose. Call your doctor or health care professional if you are unable to keep an appointment. What may interact with this medicine? This medicine may interact with the following medications:  certain types of chemotherapy, such as daunorubicin, doxorubicin, epirubicin, and idarubicin This list may not describe all possible interactions. Give your health care provider a list of all the medicines, herbs, non-prescription drugs, or dietary supplements you use. Also tell them if you smoke, drink alcohol, or use illegal drugs. Some items may interact with your medicine. What should I watch for while using this medicine? Visit your doctor for checks on your progress. Report any side effects. Continue your course of treatment even though you feel ill unless your doctor tells you to stop. Call your doctor or health care professional for advice if you get a  fever, chills or sore throat, or other symptoms of a cold or flu. Do not treat yourself. Try to avoid being around people who are sick. You may experience fever, chills and shaking during your first infusion. These effects are usually mild and can be treated with other medicines. Report any side effects during the infusion to your health care professional. Fever and chills usually do not happen with later infusions. Do not become pregnant while taking this medicine or for 7 months after stopping it. Women should inform their doctor if they wish to become pregnant or think they might be pregnant. Women of child-bearing potential will need to have a negative pregnancy test before starting this medicine. There is a potential for serious side effects to an unborn child. Talk to your health care professional or pharmacist for more information. Do not breast-feed an infant while taking this medicine or for 7 months after stopping it. Women must use effective birth control with this medicine.  What side effects may I notice from receiving this medicine? Side effects that you should report to your doctor or health care professional as soon as possible:  allergic reactions like skin rash, itching or hives, swelling of the face, lips, or tongue  chest pain or palpitations  cough  dizziness  feeling faint or lightheaded, falls  fever  general ill feeling or flu-like symptoms  signs of worsening heart failure like breathing problems; swelling in your legs and feet  unusually weak or tired Side effects that usually do not require medical attention (report to your doctor or health care professional if they continue or are bothersome):  bone pain  changes in taste  diarrhea  joint pain  nausea/vomiting  weight loss This list may not describe all possible side effects. Call your doctor for medical advice about side effects. You may report side effects to FDA at 1-800-FDA-1088. Where should I  keep my medicine? This drug is given in a hospital or clinic and will not be stored at home. NOTE: This sheet is a summary. It may not cover all possible information. If you have questions about this medicine, talk to your doctor, pharmacist, or health care provider.  2021 Elsevier/Gold Standard (2016-05-26 14:37:52) Carboplatin injection What is this medicine? CARBOPLATIN (KAR boe pla tin) is a chemotherapy drug. It targets fast dividing cells, like cancer cells, and causes these cells to die. This medicine is used to treat ovarian cancer and many other cancers. This medicine may be used for other purposes; ask your health care provider or pharmacist if you have questions. COMMON BRAND NAME(S): Paraplatin What should I tell my health care provider before I take this medicine? They need to know if you have any of these conditions:  blood disorders  hearing problems  kidney disease  recent or ongoing radiation therapy  an unusual or allergic reaction to carboplatin, cisplatin, other chemotherapy, other medicines, foods, dyes, or preservatives  pregnant or trying to get pregnant  breast-feeding How should I use this medicine? This drug is usually given as an infusion into a vein. It is administered in a hospital or clinic by a specially trained health care professional. Talk to your pediatrician regarding the use of this medicine in children. Special care may be needed. Overdosage: If you think you have taken too much of this medicine contact a poison control center or emergency room at once. NOTE: This medicine is only for you. Do not share this medicine with others. What if I miss a dose? It is important not to miss a dose. Call your doctor or health care professional if you are unable to keep an appointment. What may interact with this medicine?  medicines for seizures  medicines to increase blood counts like filgrastim, pegfilgrastim, sargramostim  some antibiotics like  amikacin, gentamicin, neomycin, streptomycin, tobramycin  vaccines Talk to your doctor or health care professional before taking any of these medicines:  acetaminophen  aspirin  ibuprofen  ketoprofen  naproxen This list may not describe all possible interactions. Give your health care provider a list of all the medicines, herbs, non-prescription drugs, or dietary supplements you use. Also tell them if you smoke, drink alcohol, or use illegal drugs. Some items may interact with your medicine. What should I watch for while using this medicine? Your condition will be monitored carefully while you are receiving this medicine. You will need important blood work done while you are taking this medicine. This drug may make you feel generally unwell. This  is not uncommon, as chemotherapy can affect healthy cells as well as cancer cells. Report any side effects. Continue your course of treatment even though you feel ill unless your doctor tells you to stop. In some cases, you may be given additional medicines to help with side effects. Follow all directions for their use. Call your doctor or health care professional for advice if you get a fever, chills or sore throat, or other symptoms of a cold or flu. Do not treat yourself. This drug decreases your body's ability to fight infections. Try to avoid being around people who are sick. This medicine may increase your risk to bruise or bleed. Call your doctor or health care professional if you notice any unusual bleeding. Be careful brushing and flossing your teeth or using a toothpick because you may get an infection or bleed more easily. If you have any dental work done, tell your dentist you are receiving this medicine. Avoid taking products that contain aspirin, acetaminophen, ibuprofen, naproxen, or ketoprofen unless instructed by your doctor. These medicines may hide a fever. Do not become pregnant while taking this medicine. Women should inform their  doctor if they wish to become pregnant or think they might be pregnant. There is a potential for serious side effects to an unborn child. Talk to your health care professional or pharmacist for more information. Do not breast-feed an infant while taking this medicine. What side effects may I notice from receiving this medicine? Side effects that you should report to your doctor or health care professional as soon as possible:  allergic reactions like skin rash, itching or hives, swelling of the face, lips, or tongue  signs of infection - fever or chills, cough, sore throat, pain or difficulty passing urine  signs of decreased platelets or bleeding - bruising, pinpoint red spots on the skin, black, tarry stools, nosebleeds  signs of decreased red blood cells - unusually weak or tired, fainting spells, lightheadedness  breathing problems  changes in hearing  changes in vision  chest pain  high blood pressure  low blood counts - This drug may decrease the number of white blood cells, red blood cells and platelets. You may be at increased risk for infections and bleeding.  nausea and vomiting  pain, swelling, redness or irritation at the injection site  pain, tingling, numbness in the hands or feet  problems with balance, talking, walking  trouble passing urine or change in the amount of urine Side effects that usually do not require medical attention (report to your doctor or health care professional if they continue or are bothersome):  hair loss  loss of appetite  metallic taste in the mouth or changes in taste This list may not describe all possible side effects. Call your doctor for medical advice about side effects. You may report side effects to FDA at 1-800-FDA-1088. Where should I keep my medicine? This drug is given in a hospital or clinic and will not be stored at home. NOTE: This sheet is a summary. It may not cover all possible information. If you have questions  about this medicine, talk to your doctor, pharmacist, or health care provider.  2021 Elsevier/Gold Standard (2007-09-06 14:38:05) Docetaxel injection What is this medicine? DOCETAXEL (doe se TAX el) is a chemotherapy drug. It targets fast dividing cells, like cancer cells, and causes these cells to die. This medicine is used to treat many types of cancers like breast cancer, certain stomach cancers, head and neck cancer, lung cancer, and  prostate cancer. This medicine may be used for other purposes; ask your health care provider or pharmacist if you have questions. COMMON BRAND NAME(S): Docefrez, Taxotere What should I tell my health care provider before I take this medicine? They need to know if you have any of these conditions:  infection (especially a virus infection such as chickenpox, cold sores, or herpes)  liver disease  low blood counts, like low white cell, platelet, or red cell counts  an unusual or allergic reaction to docetaxel, polysorbate 80, other chemotherapy agents, other medicines, foods, dyes, or preservatives  pregnant or trying to get pregnant  breast-feeding How should I use this medicine? This drug is given as an infusion into a vein. It is administered in a hospital or clinic by a specially trained health care professional. Talk to your pediatrician regarding the use of this medicine in children. Special care may be needed. Overdosage: If you think you have taken too much of this medicine contact a poison control center or emergency room at once. NOTE: This medicine is only for you. Do not share this medicine with others. What if I miss a dose? It is important not to miss your dose. Call your doctor or health care professional if you are unable to keep an appointment. What may interact with this medicine? Do not take this medicine with any of the following medications:  live virus vaccines This medicine may also interact with the following  medications:  aprepitant  certain antibiotics like erythromycin or clarithromycin  certain antivirals for HIV or hepatitis  certain medicines for fungal infections like fluconazole, itraconazole, ketoconazole, posaconazole, or voriconazole  cimetidine  ciprofloxacin  conivaptan  cyclosporine  dronedarone  fluvoxamine  grapefruit juice  imatinib  verapamil This list may not describe all possible interactions. Give your health care provider a list of all the medicines, herbs, non-prescription drugs, or dietary supplements you use. Also tell them if you smoke, drink alcohol, or use illegal drugs. Some items may interact with your medicine. What should I watch for while using this medicine? Your condition will be monitored carefully while you are receiving this medicine. You will need important blood work done while you are taking this medicine. Call your doctor or health care professional for advice if you get a fever, chills or sore throat, or other symptoms of a cold or flu. Do not treat yourself. This drug decreases your body's ability to fight infections. Try to avoid being around people who are sick. Some products may contain alcohol. Ask your health care professional if this medicine contains alcohol. Be sure to tell all health care professionals you are taking this medicine. Certain medicines, like metronidazole and disulfiram, can cause an unpleasant reaction when taken with alcohol. The reaction includes flushing, headache, nausea, vomiting, sweating, and increased thirst. The reaction can last from 30 minutes to several hours. You may get drowsy or dizzy. Do not drive, use machinery, or do anything that needs mental alertness until you know how this medicine affects you. Do not stand or sit up quickly, especially if you are an older patient. This reduces the risk of dizzy or fainting spells. Alcohol may interfere with the effect of this medicine. Talk to your health care  professional about your risk of cancer. You may be more at risk for certain types of cancer if you take this medicine. Do not become pregnant while taking this medicine or for 6 months after stopping it. Women should inform their doctor if they wish  to become pregnant or think they might be pregnant. There is a potential for serious side effects to an unborn child. Talk to your health care professional or pharmacist for more information. Do not breast-feed an infant while taking this medicine or for 1 week after stopping it. Males who get this medicine must use a condom during sex with females who can get pregnant. If you get a woman pregnant, the baby could have birth defects. The baby could die before they are born. You will need to continue wearing a condom for 3 months after stopping the medicine. Tell your health care provider right away if your partner becomes pregnant while you are taking this medicine. This may interfere with the ability to father a child. You should talk to your doctor or health care professional if you are concerned about your fertility. What side effects may I notice from receiving this medicine? Side effects that you should report to your doctor or health care professional as soon as possible:  allergic reactions like skin rash, itching or hives, swelling of the face, lips, or tongue  blurred vision  breathing problems  changes in vision  low blood counts - This drug may decrease the number of white blood cells, red blood cells and platelets. You may be at increased risk for infections and bleeding.  nausea and vomiting  pain, redness or irritation at site where injected  pain, tingling, numbness in the hands or feet  redness, blistering, peeling, or loosening of the skin, including inside the mouth  signs of decreased platelets or bleeding - bruising, pinpoint red spots on the skin, black, tarry stools, nosebleeds  signs of decreased red blood cells -  unusually weak or tired, fainting spells, lightheadedness  signs of infection - fever or chills, cough, sore throat, pain or difficulty passing urine  swelling of the ankle, feet, hands Side effects that usually do not require medical attention (report to your doctor or health care professional if they continue or are bothersome):  constipation  diarrhea  fingernail or toenail changes  hair loss  loss of appetite  mouth sores  muscle pain This list may not describe all possible side effects. Call your doctor for medical advice about side effects. You may report side effects to FDA at 1-800-FDA-1088. Where should I keep my medicine? This drug is given in a hospital or clinic and will not be stored at home. NOTE: This sheet is a summary. It may not cover all possible information. If you have questions about this medicine, talk to your doctor, pharmacist, or health care provider.  2021 Elsevier/Gold Standard (2019-05-01 19:50:31)        Pertuzumab injection Qu es este medicamento? El PERTUZUMAB es un anticuerpo monoclonal. Genene Churn para tratar el cncer de mama. Este medicamento puede ser utilizado para otros usos; si tiene alguna pregunta consulte con su proveedor de atencin mdica o con su farmacutico. MARCAS COMUNES: PERJETA Qu le debo informar a mi profesional de la salud antes de tomar este medicamento? Necesita saber si usted presenta alguno de los siguientes problemas o situaciones: enfermedad cardiaca insuficiencia cardiaca alta presin sangunea antecedentes de pulso cardiaca irregular radioterapia reciente o continuada una reaccin alrgica o inusual al pertuzumab, a otros medicamentos, alimentos, colorantes o conservantes si est embarazada o buscando quedar embarazada si est amamantando a un beb Cmo debo utilizar este medicamento? Este medicamento se administra mediante infusin por va intravenosa. Lo administra un profesional de Technical sales engineer en un hospital  o en  un entorno clnico. Hable con su pediatra para informarse acerca del uso de este medicamento en nios. Puede requerir atencin especial. Sobredosis: Pngase en contacto inmediatamente con un centro toxicolgico o una sala de urgencia si usted cree que haya tomado demasiado medicamento. ATENCIN: [employer] es solo para usted. No comparta este medicamento con nadie. Qu sucede si me olvido de una dosis? Es importante no olvidar ninguna dosis. Informe a su mdico o a su profesional de la salud si no puede asistir a Photographer. Qu puede interactuar con este medicamento? No se esperan interacciones. Puede ser que esta lista no menciona todas las posibles interacciones. Informe a su profesional de KB Home	[place] de [employer] productos a base de hierbas, medicamentos de [place] o suplementos nutritivos que est tomando. Si usted fuma, consume bebidas alcohlicas o si utiliza drogas ilegales, indqueselo tambin a su profesional de KB Home	[place]. Algunas sustancias pueden interactuar con su medicamento. A qu debo estar atento al usar Coca-Cola? Se supervisar su estado de salud atentamente mientras reciba este medicamento. Si presenta algn efecto secundario, infrmelo. Sin embargo, contine con el tratamiento aun si se siente enfermo, a menos que su mdico le indique que lo suspenda. No debe quedar embarazada mientras est tomando este medicamento o por 7 meses despus de dejar de usarlo. Las mujeres deben informar a su mdico si estn buscando quedar embarazadas o si creen que estn embarazadas. Las mujeres con la posibilidad de Best boy nios deben tener una prueba de embarazo negativa antes de Art gallery manager a tomar este medicamento. Existe la posibilidad de que ocurran efectos secundarios graves a un beb sin nacer. Para ms informacin hable con su profesional de la salud o su farmacutico. No debe amamantar a un beb mientras est tomando este medicamento o por 7 meses despus de dejar de usarlo. Las  [employer] deben usar un mtodo anticonceptivo eficaz con este medicamento. Consulte a su mdico o a su profesional de la salud si tiene fiebre, escalofros, dolor de garganta, o cualquier otro sntoma de resfro o gripe. No se trate usted mismo. Trate de no acercarse a personas que estn enfermas. Es posible que tenga [place], escalofros y dolor de cabeza durante la infusin. Informe cualquier efecto secundario durante la infusin a su profesional de [employer]	[place]. Qu efectos secundarios puedo tener al [employer] este medicamento? Efectos secundarios que debe informar a su mdico o a Barrister's clerk de la salud tan pronto como sea posible: Arboriculturist o palpitaciones mareos sensacin de Youth worker o aturdimiento fiebre o escalofros erupcin cutnea, picazn o urticaria dolor de garganta hinchazn de la cara, labios o lengua hinchazn de piernas o tobillos cansancio o debilidad inusual Efectos secundarios que generalmente no requieren atencin mdica (infrmelos a su mdico o a su profesional de la salud si persisten o si son molestos): diarrea cada del cabello nuseas, vmito cansancio Puede ser que esta lista no menciona todos los posibles efectos secundarios. Comunquese a su mdico por asesoramiento mdico [employer]. Usted puede informar los efectos secundarios a la FDA por telfono al 1-800-FDA-1088. Dnde debo guardar mi medicina? Este medicamento se administra en hospitales o clnicas y no necesitar guardarlo en su domicilio. ATENCIN: Este folleto es un resumen. Puede ser que no cubra toda la posible informacin. Si usted tiene preguntas acerca de esta medicina, consulte con su mdico, su farmacutico o su profesional de Technical sales engineer.  2021 Elsevier/Gold Standard (2016-07-02 00:00:00) Trastuzumab injection for infusion Qu es este medicamento? El TRASTUZUMAB es  un anticuerpo monoclonal. Genene Churn para tratar el cncer de mama y de [place]. Este  medicamento puede ser utilizado para otros usos; si tiene alguna pregunta consulte con su proveedor de atencin mdica o con su farmacutico. MARCAS COMUNES: Herceptin, Belenda Cruise, [place], [place], [place] Qu le debo informar a mi profesional de la salud antes de tomar este medicamento? Necesitan saber si usted presenta alguno de los siguientes problemas o situaciones: enfermedad cardiaca insuficiencia cardiaca enfermedad pulmonar o respiratoria, como asma una reaccin alrgica o inusual al trastuzumab, al alcohol benclico, a otros medicamentos, alimentos, colorantes o conservantes si est embarazada o buscando quedar embarazada si est amamantando a un beb Cmo debo utilizar este medicamento? Este medicamento se administra mediante infusin por va intravenosa. Lo administra un profesional de la salud calificado en un hospital o en un entorno clnico. Hable con su pediatra para informarse acerca del uso de este medicamento en nios. Este medicamento no est aprobado para uso en nios. Sobredosis: Pngase en contacto inmediatamente con un centro toxicolgico o una sala de urgencia si usted cree que haya tomado demasiado medicamento. ATENCIN: [employer] es solo para usted. No comparta este medicamento con nadie. Qu sucede si me olvido de una dosis? Es importante no olvidar ninguna dosis. Informe a su mdico o a su profesional de la salud si no puede asistir a Photographer. Qu puede interactuar con este medicamento? Este medicamento podra interactuar con los siguientes frmacos: ciertos tipos de quimioterapia, tales como daunorubicina, [place], [place], e idarubicina Puede ser que esta lista no menciona todas las posibles interacciones. Informe a su profesional de KB Home	[place] de [employer] productos a base de hierbas, medicamentos de [place] o suplementos nutritivos que est tomando. Si usted fuma, consume bebidas alcohlicas o si utiliza drogas ilegales, indqueselo  tambin a su profesional de KB Home	[place]. Algunas sustancias pueden interactuar con su medicamento. A qu debo estar atento al usar Coca-Cola? Visite a su mdico para chequear su evolucin peridicamente. Si presenta algn efecto secundario, infrmelo. Sin embargo, contine con el tratamiento aun si se siente enfermo, a menos que su mdico le indique que lo suspenda. Consulte a su mdico o a su profesional de la salud si tiene fiebre, escalofros, dolor de garganta, o cualquier otro sntoma de resfro o gripe. No se trate usted mismo. Trate de no acercarse a personas que estn enfermas. Es posible que tenga [place], escalofros y temblores durante su primera infusin. Estos efectos generalmente son leves y pueden tratarse con otros medicamentos. Informe cualquier efecto secundario durante la infusin a su profesional de [employer] Home	[place]. La fiebre y los escalofros por lo general no suceden con las infusiones posteriores. No debe quedar embarazada mientras est tomando este medicamento o por 7 meses despus de dejar de usarlo. Las mujeres deben informar a su mdico si estn buscando quedar embarazadas o si creen que estn embarazadas. Las mujeres con la posibilidad de Best boy nios deben tener una prueba de embarazo negativa antes de Art gallery manager a tomar este medicamento. Existe la posibilidad de que ocurran efectos secundarios graves a un beb sin nacer. Para ms informacin hable con su profesional de la salud o su farmacutico. No debe amamantar a un beb mientras est tomando este medicamento o por 7 meses despus de dejar de usarlo. Las [employer] deben usar un mtodo anticonceptivo eficaz con este medicamento. Qu efectos secundarios puedo tener al [employer] este medicamento? Efectos secundarios que debe informar a su mdico o a Barrister's clerk de la salud tan pronto como sea  posible: reacciones alrgicas, como erupcin cutnea, comezn/picazn o urticaria, e hinchazn de la cara, los labios o la lengua dolor en el  pecho o palpitaciones tos mareos sensacin de Youth worker o aturdimiento, cadas fiebre sensacin general de estar enfermo o sntomas gripales signos de empeoramiento de la insuficiencia cardiaca, tales como problemas respiratorios; hinchazn en las piernas y los pies cansancio o debilidad inusual Efectos secundarios que generalmente no requieren atencin mdica (infrmelos a su mdico o a Barrister's clerk de la salud si persisten o si son molestos): dolor de Psychiatric nurse sentido del gusto diarrea dolor en las articulaciones nuseas, vmito prdida de peso Puede ser que esta lista no menciona todos los posibles efectos secundarios. Comunquese a su mdico por asesoramiento mdico [employer]. Usted puede informar los efectos secundarios a la FDA por telfono al 1-800-FDA-1088. Dnde debo guardar mi medicina? Este medicamento se administra en hospitales o clnicas y no necesitar guardarlo en su domicilio. ATENCIN: Este folleto es un resumen. Puede ser que no cubra toda la posible informacin. Si usted tiene preguntas acerca de esta medicina, consulte con su mdico, su farmacutico o su profesional de Technical sales engineer.  2021 Elsevier/Gold Standard (2019-12-05 00:00:00) Carboplatin injection Qu es este medicamento? El CARBOPLATINO es un agente quimioteraputico. Este medicamento acta sobre las clulas que se dividen rpidamente, como las clulas cancergenas, y finalmente provoca la muerte de estas clulas. Se utiliza en el tratamiento del cncer de ovario y muchos otros tipos de Hotel manager. Este medicamento puede ser utilizado para otros usos; si tiene alguna pregunta consulte con su proveedor de atencin mdica o con su farmacutico. MARCAS COMUNES: Paraplatin Qu le debo informar a mi profesional de la salud antes de tomar este medicamento? Necesita saber si usted presenta alguno de los siguientes problemas o situaciones:  trastornos sanguneos  problemas auditivos  enfermedad  renal  radioterapia reciente o continuada  una reaccin alrgica o inusual al carboplatino, al cisplatino, a otros agentes quimioteraputicos, a otros medicamentos, alimentos, colorantes o conservantes  si est embarazada o buscando quedar embarazada  si est amamantando a un beb Cmo debo [employer]? Este medicamento se administra normalmente mediante infusin por va intravenosa. Lo administra un profesional de la salud calificado en un hospital o en un entorno clnico. Hable con su pediatra para informarse acerca del uso de este medicamento en nios. Puede requerir atencin especial. Sobredosis: Pngase en contacto inmediatamente con un centro toxicolgico o una sala de urgencia si usted cree que haya tomado demasiado medicamento. ATENCIN: [employer] es solo para usted. No comparta este medicamento con nadie. Qu sucede si me olvido de una dosis? Es importante no olvidar ninguna dosis. Informe a su mdico o a su profesional de la salud si no puede asistir a Photographer. Qu puede interactuar con este medicamento?  medicamentos para convulsiones  medicamentos para incrementar los conteos sanguneos, tales como filgrastim, pegfilgrastim, sargramostim  ciertos antibiticos, tales como amicacina, gentamicina, neomicina, estreptomicina, tobramicina  vacunas Consulte a su mdico o a su profesional de la salud antes de tomar cualquiera de los siguientes medicamentos:  acetaminofeno  aspirina  ibuprofeno  quetoprofeno  naproxeno Puede ser que esta lista no menciona todas las posibles interacciones. Informe a su profesional de KB Home	[place] de [employer] productos a base de hierbas, medicamentos de [place] o suplementos nutritivos que est tomando. Si usted fuma, consume bebidas alcohlicas o si utiliza drogas ilegales, indqueselo tambin a su profesional de KB Home	[place]. Algunas sustancias pueden interactuar con su medicamento.  A qu debo estar atento al usar [employer]? Se supervisar su estado de salud atentamente mientras reciba este medicamento. Tendr que hacerse anlisis de sangre peridicos mientras est tomando este medicamento. Este medicamento puede hacerle sentir un Nurse, mental health. Esto es normal ya que la quimioterapia afecta tanto a las clulas sanas como a las clulas cancerosas. Si presenta alguno de los [employer], infrmelos. Sin embargo, contine con el tratamiento aun si se siente enfermo, a menos que su mdico le indique que lo suspenda. En algunos casos, podr recibir [employer] para ayudarle con los efectos secundarios. Siga las instrucciones para usarlos. Consulte a su mdico o a su profesional de la salud por asesoramiento si tiene fiebre, escalofros, dolor de garganta o cualquier otro sntoma de resfro o gripe. No se trate usted mismo. Este medicamento puede reducir la capacidad del cuerpo para combatir infecciones. Trate de no acercarse a personas que estn enfermas. [employer] puede aumentar el riesgo de magulladuras o sangrado. Consulte a su mdico o a su profesional de la salud si observa sangrados inusuales. Proceda con cuidado al cepillar sus dientes, usar hilo dental o Risk manager palillos para los dientes, ya que puede contraer una infeccin o Therapist, art con mayor facilidad. Si se somete a algn tratamiento dental, informe a su dentista que est [employer]. Evite tomar productos que contienen aspirina, acetaminofeno, ibuprofeno, naproxeno o quetoprofeno a menos que as lo indique su mdico. Estos productos pueden disimular la fiebre. No se debe quedar embarazada mientras recibe este medicamento. Las mujeres deben informar a su mdico si estn buscando quedar embarazadas o si creen que estn embarazadas. Existe la posibilidad de efectos secundarios graves a un beb sin nacer. Para ms informacin hable con su profesional de la salud o su farmacutico. No debe Economist a un beb mientras  est usando este medicamento. Qu efectos secundarios puedo tener al [employer] este medicamento? Efectos secundarios que debe informar a su mdico o a Barrister's clerk de la salud tan pronto como sea posible:  Chief of Staff como erupcin cutnea, picazn o urticarias, hinchazn de la cara, labios o lengua  signos de infeccin - fiebre o escalofros, tos, dolor de garganta, dolor o dificultad para orinar  signos de reduccin de plaquetas o sangrado - magulladuras, puntos rojos en la piel, heces de color oscuro o con aspecto alquitranado, sangrando por la nariz  signos de reduccin de glbulos rojos - cansancio o debilidad inusual, desmayos, sensacin de [employer]  problemas respiratorios  cambios de audicin  cambios en la visin  dolor en el pecho  alta presin sangunea  conteos sanguneos bajos - Este medicamento puede reducir la cantidad de glbulos blancos, glbulos rojos y plaquetas. Su riesgo de infeccin y [place].  nuseas, vmito  dolor, enrojecimiento, hinchazn o irritacin en el lugar de la inyeccin  dolor, hormigueo, entumecimiento de manos o pies  problemas de coordinacin, del habla, al caminar  dificultad para orinar o cambios en el volumen de orina Efectos secundarios que, por lo general, no requieren atencin mdica (debe informarlos a su mdico o a su profesional de la salud si persisten o si son molestos):  cada del cabello  prdida del apetito  sabor metlico o cambios en el sentido del gusto Puede ser que esta lista no menciona todos los posibles efectos secundarios. Comunquese a su mdico por asesoramiento mdico [employer]. Usted puede informar los efectos secundarios a la FDA por telfono al 1-800-FDA-1088. Dnde debo guardar mi medicina? Este medicamento  se administra en hospitales o clnicas y no necesitar guardarlo en su domicilio. ATENCIN: Este folleto es un resumen. Puede ser que no cubra toda la  posible informacin. Si usted tiene preguntas acerca de esta medicina, consulte con su mdico, su farmacutico o su profesional de Technical sales engineer.  2021 Elsevier/Gold Standard (2014-07-24 00:00:00) Docetaxel injection Qu es este medicamento? El DOCETAXEL es un agente quimioteraputico. Este medicamento acta sobre las clulas que se dividen rpidamente, como las clulas cancerosas, y finalmente provoca la muerte de estas clulas. Este medicamento se [place] para tratar muchos tipos de cncer, tales como cncer de mama, ciertos tipos de cncer de estmago, cncer de cabeza y cuello, cncer de pulmn y cncer de prstata. Este medicamento puede ser utilizado para otros usos; si tiene alguna pregunta consulte con su proveedor de atencin mdica o con su farmacutico. MARCAS COMUNES: Docefrez, Taxotere Qu le debo informar a mi profesional de la salud antes de tomar este medicamento? Necesitan saber si usted presenta alguno de los siguientes problemas o situaciones: infeccin (especialmente infecciones virales, como varicela, fuegos labiales o herpes) enfermedad heptica recuentos sanguneos bajos, como baja cantidad de glbulos blancos, plaquetas o glbulos rojos una reaccin alrgica o inusual al docetaxel, al polisorbato 80, a otros medicamentos quimioteraputicos, a otros medicamentos, alimentos, colorantes o conservantes si est embarazada o buscando quedar embarazada si est amamantando a un beb Cmo debo [employer]? Este medicamento se administra como infusin en una vena. Un profesional de la salud especialmente capacitado lo administra en un hospital o clnica. Hable con su pediatra para informarse acerca del uso de este medicamento en nios. Puede requerir atencin especial. Sobredosis: Pngase en contacto inmediatamente con un centro toxicolgico o una sala de urgencia si usted cree que haya tomado demasiado medicamento. ATENCIN: [employer] es solo para usted. No comparta este  medicamento con nadie. Qu sucede si me olvido de una dosis? Es importante no olvidar ninguna dosis. Informe a su mdico o a su profesional de la salud si no puede asistir a Photographer. Qu puede interactuar con este medicamento? No use este medicamento con ninguno de los siguientes frmacos: vacunas de virus vivos Este medicamento tambin puede interactuar con los siguientes frmacos: aprepitant ciertos antibiticos, tales como eritromicina o claritromicina ciertos medicamentos antivirales para VIH o hepatitis ciertos medicamentos para infecciones micticas, tales como fluconazol, itraconazol, ketoconazol, posaconazol o voriconazol cimetidina ciprofloxacino conivaptn ciclosporina dronedarona fluvoxamina jugo de toronja (pomelo) imatinib verapamilo Puede ser que esta lista no menciona todas las posibles interacciones. Informe a su profesional de KB Home	[place] de [employer] productos a base de hierbas, medicamentos de [place] o suplementos nutritivos que est tomando. Si usted fuma, consume bebidas alcohlicas o si utiliza drogas ilegales, indqueselo tambin a su profesional de [employer] Home	[place]. Algunas sustancias pueden interactuar con su medicamento. A qu debo estar atento al usar Coca-Cola? Se supervisar su estado de salud atentamente mientras reciba este medicamento. Tendr que hacerse anlisis de sangre importantes mientras est usando este medicamento. Llame a su mdico o a su profesional de la salud si tiene fiebre, escalofros o dolor de garganta, o cualquier otro sntoma de resfro o gripe. No se trate usted mismo. Este medicamento reduce la capacidad del cuerpo para combatir infecciones. Trate de no acercarse a personas que estn enfermas. Algunos productos pueden contener alcohol. Pregunte a su profesional de la salud si este medicamento contiene alcohol. Asegrese de informar a todos los profesionales de la salud que usted est usando [place]. Ciertos medicamentos, como metronidazol  y disulfiram, pueden causar una reaccin desagradable cuando se usan con alcohol. Esta reaccin incluye enrojecimiento, dolor de cabeza, nuseas, vmitos, sudoracin y aumento de la sed. La reaccin puede durar de 30 minutos a varias horas. Puede experimentar somnolencia o mareos. No conduzca, no utilice maquinaria ni haga nada que Associate Professor en estado de alerta hasta que sepa cmo le afecta este medicamento. No se siente ni se ponga de pie con rapidez, especialmente si es un paciente de edad avanzada. Esto reduce el riesgo de mareos o [employer]. El alcohol puede interferir con el efecto de este medicamento. Hable con su profesional de la salud sobre su riesgo de Hotel manager. Usted puede tener mayor riesgo para ciertos tipos de cncer si [place] este medicamento. No debe quedar embarazada mientras est usando este medicamento o por 6 meses despus de dejar de usarlo. Las mujeres deben informar a su mdico si estn buscando quedar embarazadas o si creen que podran estar embarazadas. Existe la posibilidad de efectos secundarios graves en un beb sin nacer. Para obtener ms informacin, hable con su profesional de la salud o su farmacutico. No debe Economist a un beb mientras est usando este medicamento o por 1 semana despus de dejar de usarlo. Los hombres que reciben este medicamento deben usar un condn durante las relaciones sexuales con mujeres que puedan quedar embarazadas. Si usted embaraza a [employer], el beb podra tener defectos de nacimiento. El beb podra morir antes de Associate Professor. Deber seguir usando condn durante 3 meses despus de suspender el medicamento. Informe a su proveedor de Geophysical data processor de inmediato si su pareja queda embarazada mientras usted est [employer]. Esto puede interferir con la capacidad de los hombres para Social worker a Musician. Usted debe hablar con su mdico o su profesional de la salud si est preocupado por su fertilidad. Qu efectos secundarios puedo  tener al [employer] este medicamento? Efectos secundarios que debe informar a su mdico o a Barrister's clerk de la salud tan pronto como sea posible: Chief of Staff, tales como erupcin cutnea, comezn/picazn o urticaria, e hinchazn de la cara, los labios o la lengua visin borrosa problemas para respirar cambios en la visin recuentos sanguneos bajos: este frmaco podra reducir la cantidad de glbulos blancos, glbulos rojos y plaquetas. Su riesgo de infeccin y sangrado podra ser mayor. nuseas y Proofreader, enrojecimiento o Actor de Air cabin crew, hormigueo o entumecimiento de las manos o los pies enrojecimiento, formacin de Nurse, children's, descamacin o distensin de la piel, incluso dentro de la boca signos de disminucin en la cantidad de plaquetas o sangrado: moretones, puntos rojos en la piel, heces de color negro y aspecto alquitranado, sangrado por la nariz signos de disminucin en la cantidad de glbulos rojos: debilidad o cansancio inusuales, Youth worker, aturdimiento signos de infeccin: fiebre o escalofros, tos, Social research officer, government de garganta, Social research officer, government o dificultad para orinar hinchazn de tobillos, pies, manos Efectos secundarios que generalmente no requieren atencin mdica (infrmelos a su mdico o a Barrister's clerk de la salud si persisten o si son molestos): estreimiento diarrea cambios en las uas de las manos o de los pies cada del cabello prdida del apetito llagas en la boca dolor muscular Puede ser que esta lista no menciona todos los posibles efectos secundarios. Comunquese a su mdico por asesoramiento mdico [employer]. Usted puede informar los efectos secundarios a la FDA por telfono al 1-800-FDA-1088. Dnde debo guardar mi medicina? Este medicamento se administra en hospitales o clnicas, y no necesitar  guardarlo en su domicilio. ATENCIN: Este folleto es un resumen. Puede ser que no cubra toda la posible informacin. Si usted tiene preguntas  acerca de esta medicina, consulte con su mdico, su farmacutico o su profesional de Technical sales engineer.  2021 Elsevier/Gold Standard (2019-10-05 00:00:00)

## 2020-10-18 NOTE — Research (Signed)
DCP-001: Use of a Clinical Trial Screening Tool to Address Cancer Health Disparities in the [place] [facility])  Patient Tammie [name] was identified by her research nurse as a potential candidate for the above listed study.  This Clinical Research Nurse met with Naz Denunzio, [id number], on 10/18/20 in a manner and location that ensures patient privacy to discuss participation in the above listed research study.  Patient is Unaccompanied.  A copy of the informed consent document and separate HIPAA Authorization was provided to the patient.  Patient reads, speaks, and understands [place].   Patient was provided with the business card of this Nurse and encouraged to contact the research team with any questions.  Approximately 15 minutes were spent with the patient reviewing the informed consent documents.  Patient was provided the option of taking informed consent documents home to review and was encouraged to review at their convenience with their support network, including other care providers. Patient took the consent documents home to review.  The pt was told that her participation in this study is completely voluntary.  The pt knows that this is not a treatment trial, but it is one-time collection of information to better understand the clinical trial participation process.  The pt was thanked for her willingness to read the consent forms.  The research team will see the pt next week when she comes in for her routine follow up appointment to see if she has any questions about the study. Brion Aliment RN, BSN, CCRP Clinical Research Nurse Lead 10/18/2020 10:31 AM

## 2020-10-21 ENCOUNTER — Other Ambulatory Visit: Payer: Self-pay | Admitting: Hematology and Oncology

## 2020-10-21 ENCOUNTER — Telehealth: Payer: Self-pay | Admitting: *Deleted

## 2020-10-21 ENCOUNTER — Inpatient Hospital Stay: Payer: 59

## 2020-10-21 ENCOUNTER — Other Ambulatory Visit: Payer: Self-pay

## 2020-10-21 VITALS — BP 133/86 | HR 80 | Temp 98.5°F | Resp 18

## 2020-10-21 DIAGNOSIS — C50512 Malignant neoplasm of lower-outer quadrant of left female breast: Secondary | ICD-10-CM

## 2020-10-21 DIAGNOSIS — Z170 Estrogen receptor positive status [ER+]: Secondary | ICD-10-CM

## 2020-10-21 DIAGNOSIS — Z5111 Encounter for antineoplastic chemotherapy: Secondary | ICD-10-CM | POA: Diagnosis not present

## 2020-10-21 MED ORDER — PEGFILGRASTIM-CBQV 6 MG/0.6ML ~~LOC~~ SOSY
6.0000 mg | PREFILLED_SYRINGE | Freq: Once | SUBCUTANEOUS | Status: AC
  Administered 2020-10-21: 6 mg via SUBCUTANEOUS

## 2020-10-21 MED ORDER — PEGFILGRASTIM-CBQV 6 MG/0.6ML ~~LOC~~ SOSY
PREFILLED_SYRINGE | SUBCUTANEOUS | Status: AC
  Filled 2020-10-21: qty 0.6

## 2020-10-21 NOTE — Patient Instructions (Signed)
Pegfilgrastim injection What is this medicine? PEGFILGRASTIM (PEG fil gra stim) is a long-acting granulocyte colony-stimulating factor that stimulates the growth of neutrophils, a type of white blood cell important in the body's fight against infection. It is used to reduce the incidence of fever and infection in patients with certain types of cancer who are receiving chemotherapy that affects the bone marrow, and to increase survival after being exposed to high doses of radiation. This medicine may be used for other purposes; ask your health care provider or pharmacist if you have questions. COMMON BRAND NAME(S): Fulphila, Neulasta, Nyvepria, UDENYCA, Ziextenzo What should I tell my health care provider before I take this medicine? They need to know if you have any of these conditions:  kidney disease  latex allergy  ongoing radiation therapy  sickle cell disease  skin reactions to acrylic adhesives (On-Body Injector only)  an unusual or allergic reaction to pegfilgrastim, filgrastim, other medicines, foods, dyes, or preservatives  pregnant or trying to get pregnant  breast-feeding How should I use this medicine? This medicine is for injection under the skin. If you get this medicine at home, you will be taught how to prepare and give the pre-filled syringe or how to use the On-body Injector. Refer to the patient Instructions for Use for detailed instructions. Use exactly as directed. Tell your healthcare provider immediately if you suspect that the On-body Injector may not have performed as intended or if you suspect the use of the On-body Injector resulted in a missed or partial dose. It is important that you put your used needles and syringes in a special sharps container. Do not put them in a trash can. If you do not have a sharps container, call your pharmacist or healthcare provider to get one. Talk to your pediatrician regarding the use of this medicine in children. While this drug  may be prescribed for selected conditions, precautions do apply. Overdosage: If you think you have taken too much of this medicine contact a poison control center or emergency room at once. NOTE: This medicine is only for you. Do not share this medicine with others. What if I miss a dose? It is important not to miss your dose. Call your doctor or health care professional if you miss your dose. If you miss a dose due to an On-body Injector failure or leakage, a new dose should be administered as soon as possible using a single prefilled syringe for manual use. What may interact with this medicine? Interactions have not been studied. This list may not describe all possible interactions. Give your health care provider a list of all the medicines, herbs, non-prescription drugs, or dietary supplements you use. Also tell them if you smoke, drink alcohol, or use illegal drugs. Some items may interact with your medicine. What should I watch for while using this medicine? Your condition will be monitored carefully while you are receiving this medicine. You may need blood work done while you are taking this medicine. Talk to your health care provider about your risk of cancer. You may be more at risk for certain types of cancer if you take this medicine. If you are going to need a MRI, CT scan, or other procedure, tell your doctor that you are using this medicine (On-Body Injector only). What side effects may I notice from receiving this medicine? Side effects that you should report to your doctor or health care professional as soon as possible:  allergic reactions (skin rash, itching or hives, swelling of   the face, lips, or tongue)  back pain  dizziness  fever  pain, redness, or irritation at site where injected  pinpoint red spots on the skin  red or dark-brown urine  shortness of breath or breathing problems  stomach or side pain, or pain at the shoulder  swelling  tiredness  trouble  passing urine or change in the amount of urine  unusual bruising or bleeding Side effects that usually do not require medical attention (report to your doctor or health care professional if they continue or are bothersome):  bone pain  muscle pain This list may not describe all possible side effects. Call your doctor for medical advice about side effects. You may report side effects to FDA at 1-800-FDA-1088. Where should I keep my medicine? Keep out of the reach of children. If you are using this medicine at home, you will be instructed on how to store it. Throw away any unused medicine after the expiration date on the label. NOTE: This sheet is a summary. It may not cover all possible information. If you have questions about this medicine, talk to your doctor, pharmacist, or health care provider.  2021 Elsevier/Gold Standard (2019-06-23 13:20:51)  

## 2020-10-22 ENCOUNTER — Other Ambulatory Visit (HOSPITAL_COMMUNITY): Payer: 59

## 2020-10-22 ENCOUNTER — Inpatient Hospital Stay: Payer: 59

## 2020-10-23 DIAGNOSIS — Z1379 Encounter for other screening for genetic and chromosomal anomalies: Secondary | ICD-10-CM | POA: Insufficient documentation

## 2020-10-24 ENCOUNTER — Telehealth: Payer: Self-pay | Admitting: Genetic Counselor

## 2020-10-24 ENCOUNTER — Telehealth: Payer: Self-pay | Admitting: *Deleted

## 2020-10-24 NOTE — Assessment & Plan Note (Signed)
10/01/2020:Screening mammogram showed a left breast mass and calcifications. Diagnostic mammogram and US showed a 1.7cm and 0.5cm mass at the 5 o'clock position in the left breast, with one 0.4cm abnormal right axillary lymph node. 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  Treatment Plan: 1. Neoadjuvant chemotherapy with TCH Perjeta 6 cycles followed by Herceptin Perjeta maintenance versus Kadcyla maintenance (based on response to neoadjuvant chemo) for 1 year 2. Followed by breast conserving surgery if possible with sentinel lymph node study 3. Followed by adjuvant radiation therapy 4.Followed by antiestrogen therapy although ER is weak URCC nausea study Breast MRI: 10/17/20: 2.4 cm left breast mass, intramammary lymph node (needs second look ultrasound and biopsy) ------------------------------------------------------------------------------------------------------------------------- Current treatment: Cycle 1 day 8 TCHP Chemo Toxicities:  RTC in 2 weeks for cycle 2

## 2020-10-24 NOTE — Telephone Encounter (Signed)
LVM that her genetic test results are available and requested that she call back to discuss them.  

## 2020-10-24 NOTE — Progress Notes (Signed)
Patient Care Team: [name], FNP as PCP - General (Family Medicine) Rockwell Germany, RN as Oncology Nurse Navigator Mauro Kaufmann, RN as Oncology Nurse Navigator Donnie Mesa, MD as Consulting Physician (General Surgery) Nicholas Lose, MD as Consulting Physician (Hematology and Oncology) Gery Pray, MD as Consulting Physician (Radiation Oncology)  DIAGNOSIS:    ICD-10-CM   1. Malignant neoplasm of lower-outer quadrant of left breast of female, estrogen receptor positive ([place])  C50.512    Z17.0     SUMMARY OF ONCOLOGIC HISTORY: Oncology History  Malignant neoplasm of lower-outer quadrant of left breast of female, estrogen receptor positive ([place])  10/01/2020 Initial Diagnosis   Screening mammogram showed a left breast mass and calcifications. Diagnostic mammogram and US showed a 1.7cm and 0.5cm mass at the 5 o'clock position in the left breast, with one 0.4cm abnormal right axillary lymph node. 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   10/09/2020 Cancer Staging   Staging form: Breast, AJCC 8th Edition - Clinical stage from 10/09/2020: Stage IB (cT2, cN1, cM0, G3, ER+, PR+, HER2+) - Signed by Nicholas Lose, MD on 10/09/2020 Stage prefix: Initial diagnosis Histologic grading system: 3 grade system Percentage of positive estrogen receptors (%): 5 Percentage of positive progesterone receptors (%): 1 Ki-67 (%): 20   10/18/2020 -  Chemotherapy    Patient is on Treatment Plan: BREAST  DOCETAXEL + CARBOPLATIN + TRASTUZUMAB + PERTUZUMAB  (TCHP) Q21D         CHIEF COMPLIANT: Cycle 1 Day 8 TCH Perjeta  INTERVAL HISTORY: Tammie [name] is a 45 y.o. with above-mentioned history of left breast cancer currently on neoadjuvant chemotherapy with [place]. She presents to the clinic today for a toxicity check following cycle 1.   She had intermittent nausea starting on day 4 today 7.  She was also profoundly  tired.  She had diarrhea 1-2 episodes per day for the past week.  She took some Imodium which has helped her.  Taste is not as great.  She also had mild dizziness.  This is probably because she is not drinking enough water.  ALLERGIES:  has No Known Allergies.  MEDICATIONS:  Current Outpatient Medications  Medication Sig Dispense Refill  . Ascorbic Acid (VITAMIN C) 1000 MG tablet Take 1,000 mg by mouth daily.    . Cholecalciferol (DIALYVITE VITAMIN D 5000) 125 MCG (5000 UT) capsule Take 5,000 Units by mouth daily.    . citalopram (CELEXA) 20 MG tablet Take 20 mg by mouth daily.    Marland Kitchen dexamethasone (DECADRON) 4 MG tablet Take 1 tablet (4 mg total) by mouth 2 (two) times daily. Take 1 tablet day before chemo and 1 tablet day after chemo with food 12 tablet 0  . ibuprofen (ADVIL) 800 MG tablet Take 800 mg by mouth 2 (two) times daily.    Marland Kitchen lidocaine-prilocaine (EMLA) cream Apply to affected area once 30 g 3  . lisinopril (ZESTRIL) 20 MG tablet Take 1 tablet by mouth daily.    . magnesium gluconate (MAGONATE) 500 MG tablet Take 500 mg by mouth 2 (two) times daily.    . metoprolol tartrate (LOPRESSOR) 25 MG tablet Take 25 mg by mouth daily.    . NON FORMULARY Take 900 mg by mouth daily. Hyaluronic Acid Complex    . ondansetron (ZOFRAN) 8 MG tablet Take 1 tablet (8 mg total) by mouth 2 (two) times daily as needed (Nausea or  vomiting). Start on the third day after chemotherapy. 30 tablet 1  . OVER THE COUNTER MEDICATION Take 4 capsules by mouth daily. Laminine supplement- 620 mg    . oxyCODONE (OXY IR/ROXICODONE) 5 MG immediate release tablet Take 1 tablet (5 mg total) by mouth every 6 (six) hours as needed for severe pain. 15 tablet 0  . prochlorperazine (COMPAZINE) 10 MG tablet Take 1 tablet (10 mg total) by mouth every 6 (six) hours as needed (Nausea or vomiting). 30 tablet 1  . terbinafine (LAMISIL) 250 MG tablet Take 250 mg by mouth daily.     No current facility-administered medications for this  visit.    PHYSICAL EXAMINATION: ECOG PERFORMANCE STATUS: 1 - Symptomatic but completely ambulatory  Vitals:   10/25/20 0833  BP: 106/60  Pulse: 95  Resp: 18  Temp: (!) 97.5 F (36.4 C)  SpO2: 97%   Filed Weights   10/25/20 0833  Weight: 156 lb (70.8 kg)    LABORATORY DATA:  I have reviewed the data as listed CMP Latest Ref Rng & Units 10/18/2020 10/09/2020  Glucose 70 - 99 mg/dL 136(H) 106(H)  BUN 6 - 20 mg/dL 11 18  Creatinine 0.44 - 1.00 mg/dL 0.76 0.73  Sodium 135 - 145 mmol/L 140 139  Potassium 3.5 - 5.1 mmol/L 4.0 3.8  Chloride 98 - 111 mmol/L 105 102  CO2 22 - 32 mmol/L 24 27  Calcium 8.9 - 10.3 mg/dL 9.0 9.3  Total Protein 6.5 - 8.1 g/dL 6.9 7.3  Total Bilirubin 0.3 - 1.2 mg/dL 0.3 0.3  Alkaline Phos 38 - 126 U/L 45 48  AST 15 - 41 U/L 14(L) 14(L)  ALT 0 - 44 U/L 9 12    Lab Results  Component Value Date   WBC 7.9 10/25/2020   HGB 13.3 10/25/2020   HCT 38.2 10/25/2020   MCV 85.7 10/25/2020   PLT 174 10/25/2020   NEUTROABS PENDING 10/25/2020    ASSESSMENT & PLAN:  Malignant neoplasm of lower-outer quadrant of left breast of female, estrogen receptor positive ([place]) 10/01/2020:Screening mammogram showed a left breast mass and calcifications. Diagnostic mammogram and US showed a 1.7cm and 0.5cm mass at the 5 o'clock position in the left breast, with one 0.4cm abnormal right axillary lymph node. 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  Treatment Plan: 1. Neoadjuvant chemotherapy with TCH Perjeta 6 cycles followed by Herceptin Perjeta maintenance versus Kadcyla maintenance (based on response to neoadjuvant chemo) for 1 year 2. Followed by breast conserving surgery if possible with sentinel lymph node study 3. Followed by adjuvant radiation therapy 4.Followed by antiestrogen therapy although ER is weak URCC nausea study Breast MRI: 10/17/20: 2.4 cm left breast mass, intramammary lymph  node (needs second look ultrasound and biopsy) ------------------------------------------------------------------------------------------------------------------------- Current treatment: Cycle 1 day 8 TCHP Chemo Toxicities: 1.  Nausea: Intermittent in nature.  I discussed with her about taking preventative nausea medication from day 4 today 7 2. intermittent diarrhea once or twice per day: She took some Imodium and it has helped her. 3.  Dizziness: Her blood pressure is running low.  She tells me that she took extra metoprolol yesterday.  This is because of increased heart rate issues.  She has not been drinking enough water and encouraged her to drink more water   She is drinking a beets and fruits containing juices and she thinks that is helping her significantly.  RTC in 2 weeks for  cycle 2    No orders of the defined types were placed in this encounter.  The patient has a good understanding of the overall plan. she agrees with it. she will call with any problems that may develop before the next visit here.  Total time spent: 30 mins including face to face time and time spent for planning, charting and coordination of care  Rulon Eisenmenger, MD, MPH 10/25/2020  I, Molly Dorshimer, am acting as scribe for Dr. Nicholas Lose.  I have reviewed the above documentation for accuracy and completeness, and I agree with the above.

## 2020-10-24 NOTE — Telephone Encounter (Signed)
DCP-001: Use of a Clinical Trial Screening Tool to Address Cancer Health Disparities in the [facility])   The research nurse called the pt to discuss the DCP study.  The pt stated that she has read the consent form.  The pt denied any questions/concerns about the study.  The pt stated that she wanted to participate in the study.  The research nurse told the pt that she would have another research nurse/coordinator see the pt tomorrow about signing the DCP consent while she is in the clinic for her follow up visit.  The pt verbalized understanding.  The pt will be seen tomorrow by the research staff.  The pt was thanked for her time and consideration of this study.  Brion Aliment RN, BSN, [place] Clinical Research Nurse Lead 10/24/2020 2:00 PM

## 2020-10-25 ENCOUNTER — Inpatient Hospital Stay: Payer: 59 | Admitting: Dietician

## 2020-10-25 ENCOUNTER — Inpatient Hospital Stay (HOSPITAL_BASED_OUTPATIENT_CLINIC_OR_DEPARTMENT_OTHER): Payer: 59 | Admitting: Hematology and Oncology

## 2020-10-25 ENCOUNTER — Ambulatory Visit: Payer: 59

## 2020-10-25 ENCOUNTER — Encounter: Payer: Self-pay | Admitting: *Deleted

## 2020-10-25 ENCOUNTER — Inpatient Hospital Stay: Payer: 59

## 2020-10-25 ENCOUNTER — Encounter: Payer: Self-pay | Admitting: Radiology

## 2020-10-25 ENCOUNTER — Ambulatory Visit: Payer: 59 | Admitting: Hematology and Oncology

## 2020-10-25 ENCOUNTER — Other Ambulatory Visit: Payer: 59

## 2020-10-25 ENCOUNTER — Other Ambulatory Visit: Payer: Self-pay

## 2020-10-25 DIAGNOSIS — Z5111 Encounter for antineoplastic chemotherapy: Secondary | ICD-10-CM | POA: Diagnosis not present

## 2020-10-25 DIAGNOSIS — C50512 Malignant neoplasm of lower-outer quadrant of left female breast: Secondary | ICD-10-CM | POA: Diagnosis not present

## 2020-10-25 DIAGNOSIS — Z170 Estrogen receptor positive status [ER+]: Secondary | ICD-10-CM | POA: Diagnosis not present

## 2020-10-25 LAB — COMPREHENSIVE METABOLIC PANEL
ALT: 20 U/L (ref 0–44)
AST: 16 U/L (ref 15–41)
Albumin: 4 g/dL (ref 3.5–5.0)
Alkaline Phosphatase: 53 U/L (ref 38–126)
Anion gap: 10 (ref 5–15)
BUN: 27 mg/dL — ABNORMAL HIGH (ref 6–20)
CO2: 23 mmol/L (ref 22–32)
Calcium: 9.3 mg/dL (ref 8.9–10.3)
Chloride: 101 mmol/L (ref 98–111)
Creatinine, Ser: 0.95 mg/dL (ref 0.44–1.00)
GFR, Estimated: >60 mL/min (ref >60)
Glucose, Bld: 105 mg/dL — ABNORMAL HIGH (ref 70–99)
Potassium: 4.4 mmol/L (ref 3.5–5.1)
Sodium: 134 mmol/L — ABNORMAL LOW (ref 135–145)
Total Bilirubin: 0.2 mg/dL — ABNORMAL LOW (ref 0.3–1.2)
Total Protein: 7.1 g/dL (ref 6.5–8.1)

## 2020-10-25 LAB — CBC WITH DIFFERENTIAL/PLATELET
Abs Immature Granulocytes: 0.22 10*3/uL — ABNORMAL HIGH (ref 0.00–0.07)
Basophils Absolute: 0 10*3/uL (ref 0.0–0.1)
Basophils Relative: 0 %
Eosinophils Absolute: 0 10*3/uL (ref 0.0–0.5)
Eosinophils Relative: 1 %
HCT: 38.2 % (ref 36.0–46.0)
Hemoglobin: 13.3 g/dL (ref 12.0–15.0)
Immature Granulocytes: 3 %
Lymphocytes Relative: 34 %
Lymphs Abs: 2.7 10*3/uL (ref 0.7–4.0)
MCH: 29.8 pg (ref 26.0–34.0)
MCHC: 34.8 g/dL (ref 30.0–36.0)
MCV: 85.7 fL (ref 80.0–100.0)
Monocytes Absolute: 3.4 10*3/uL — ABNORMAL HIGH (ref 0.1–1.0)
Monocytes Relative: 42 %
Neutro Abs: 1.6 10*3/uL — ABNORMAL LOW (ref 1.7–7.7)
Neutrophils Relative %: 20 %
Platelets: 174 10*3/uL (ref 150–400)
RBC: 4.46 MIL/uL (ref 3.87–5.11)
RDW: 13.6 % (ref 11.5–15.5)
WBC: 7.9 10*3/uL (ref 4.0–10.5)
nRBC: 0.3 % — ABNORMAL HIGH (ref 0.0–0.2)

## 2020-10-25 NOTE — Progress Notes (Signed)
Nutrition Assessment   Reason for Assessment: Patient request   ASSESSMENT: 45 year old female with left breast cancer. She is receiving neoadjuvant chemotherapy with TCH Perjeta. Patient followed by Dr. Lindi Adie.  Spoke with patient via telephone. She reports poor appetite with nausea and diarrhea which is helped with Imodium. She is having fatigue and asking what foods she should be eating. In the last week she has had mostly bland foods, recalls chicken broth, rice, fruit, jello, crackers and eggs. She drinks mostly Sprite. Patient does not drink much water, says it has no taste. Patient is not drinking supplements currently, says she use to drink them when younger, and liked them.   Medications: Lisinopril, Zofran, Oxycodone, Compazine, Lamisil, Celexa, Decadron   Labs: Na 134, Glucose 105, BUN 27   Anthropometrics: Weight 156 lb decreased 7 lbs (4.3%) from 163 lb on 5/6 which is significant  Height: 5'3" Weight: 156 lb UBW: 160 -165 lb (per pt) BMI: 27.63   NUTRITION DIAGNOSIS: Unintentional weight loss related to cancer and associated treatments as evidenced by diarrhea and 4.3% weight loss in 1 week which is significant.    INTERVENTION:  Educated on adequate calories and importance of protein to maintain strength/weights and nutrition throughout treatment Encouraged small frequent meals and snacks with focus on protein Handout with high calorie, high protein snack ideas given Discussed waiting 30 minutes after taking nausea medication prior to meal intake Discussed tips for nausea, handout provided Discussed strategies for diarrhea and importance of hydration, handout provided Continue Imodium as needed Suggested trying different fruits in water to add flavor  Recommended Ensure supplement, encouraged drinking 2 daily with decreased meal intake Complimentary Ensure Plus provided today   MONITORING, EVALUATION, GOAL:  Patient will tolerate adequate calories and protein to  maintain weights during treatment   Next Visit: Friday May 27 in clinic

## 2020-10-25 NOTE — Research (Signed)
DCP-001: Use of a Clinical Trial Screening Tool to Address Cancer Health Disparities in the [facility] ([facility])  10/25/20       8:30AM  CONSENT VISIT: This Clinical Research Coordinator met with Tammie [name], [id number] on 10/25/20 in a manner and location that ensures patient privacy to discuss participation in the above listed research study.  Patient is Accompanied by her husband, Tammie [name].  Patient was previously provided with informed consent documents.  Patient confirmed they have read the informed consent documents.  As outlined in the informed consent form, this Coordinator and Nadelyn Marie Zaun discussed the purpose of the research study, the investigational nature of the study, study procedures and requirements for study participation, potential risks and benefits of study participation, as well as alternatives to participation and the one-time data collection nature of this study.   Confidentiality and how the patient's information will be used as part of study participation were discussed.  The patient and her husband did not have any questions after review.  The informed consent and separate HIPAA Authorization was reviewed page by page.  The patient's mental and emotional status is appropriate to provide informed consent, and the patient verbalizes an understanding of study participation.  Patient has agreed to participate in the above listed research study and has voluntarily signed the informed consent version dated 12/06/2018, [place] Active Date 06/20/19; and separate HIPAA Authorization, version 5, dated 05/31/2019 on 10/25/20 at 8:48 AM.  The patient was provided with a copy of the signed informed consent form and separate HIPAA Authorization for their reference.  No study specific procedures were obtained prior to the signing of the informed consent document.  Approximately 10 minutes were spent with the patient reviewing the informed consent  documents.   Patient was then provided an opportunity to complete the DCP-001 Worksheet. Upon completion, this coordinator verified completeness and thanked patient and her husband for their time.   Candace Smith, RT(R)(T) Clinical Research Coordinator  

## 2020-10-28 ENCOUNTER — Ambulatory Visit: Payer: 59

## 2020-10-28 ENCOUNTER — Other Ambulatory Visit: Payer: Self-pay

## 2020-10-28 ENCOUNTER — Ambulatory Visit
Admission: RE | Admit: 2020-10-28 | Discharge: 2020-10-28 | Disposition: A | Discharge status: Home / Self Care | Payer: 59 | Source: Ambulatory Visit | Attending: Hematology and Oncology | Admitting: Hematology and Oncology

## 2020-10-28 DIAGNOSIS — C50512 Malignant neoplasm of lower-outer quadrant of left female breast: Secondary | ICD-10-CM

## 2020-10-28 IMAGING — US US BREAST*L* LIMITED INC AXILLA
1 series · 6 of 6 positions shown · non-contrast
Comparison: Previous exam(s).

CLINICAL DATA: 44-year-old female for evaluation of possible
abnormal LEFT intraparenchymal lymph node identified on recent
breast MR. Recent diagnosis of LEFT breast cancer.

EXAM:
ULTRASOUND OF THE LEFT BREAST

[Series 1: us breast*left* limited inc axilla · 0.08mm/px · 6 of 6 slices shown]
[im 1/6]
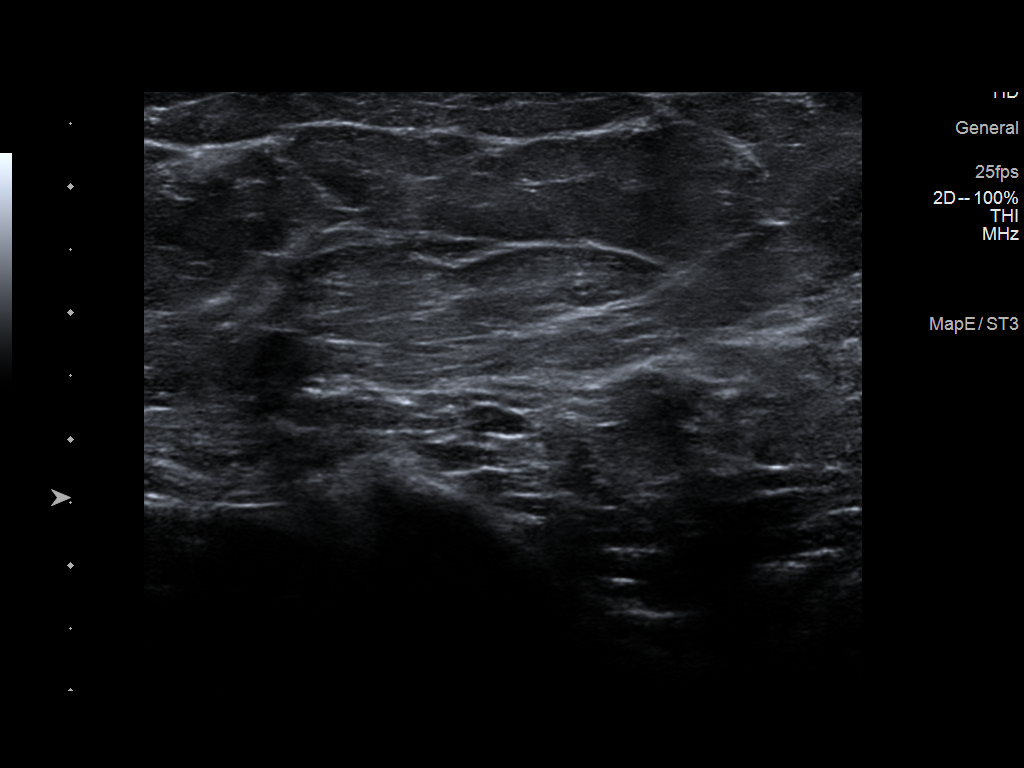
[im 2/6]
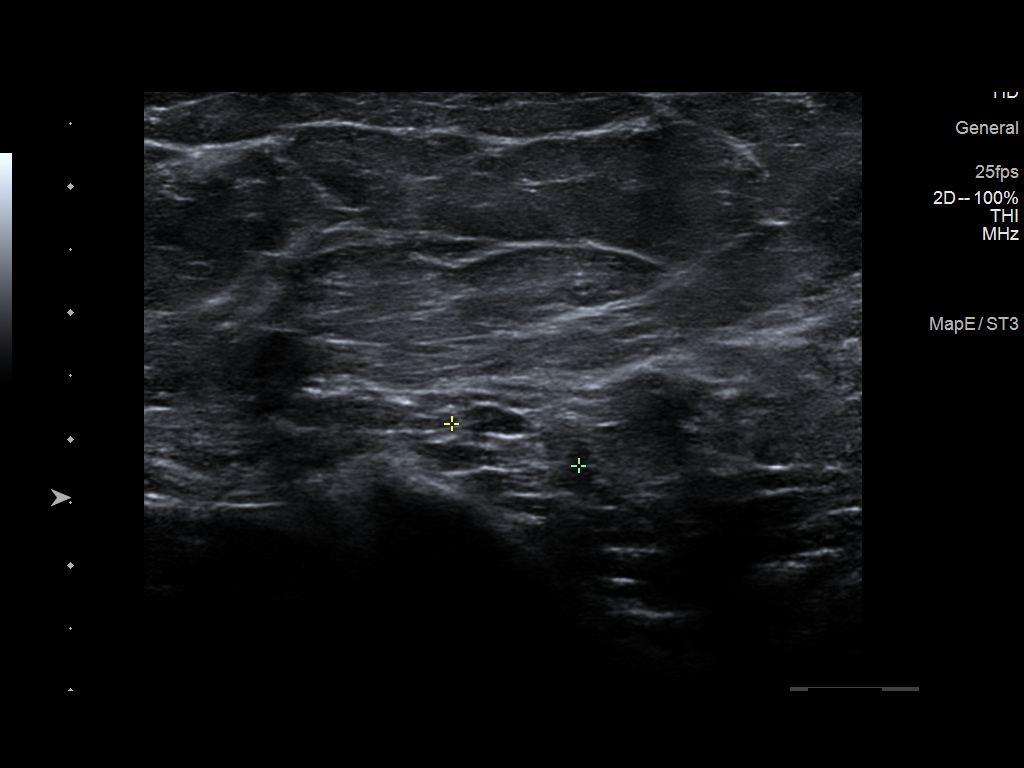
[im 3/6]
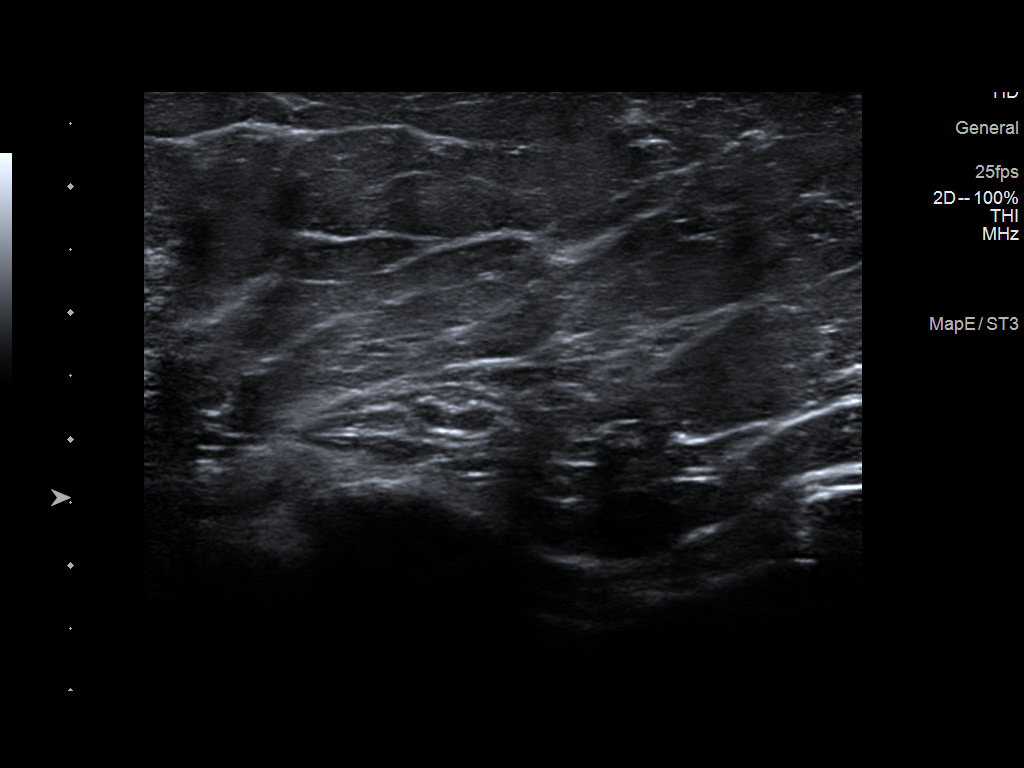
[im 4/6]
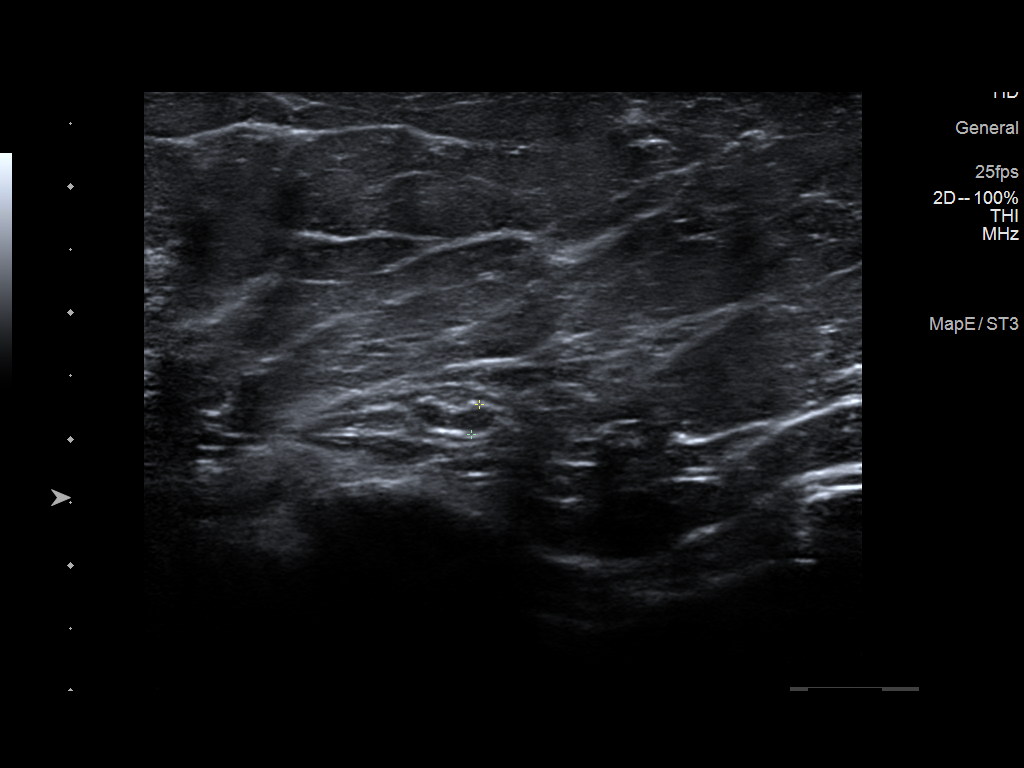
[im 5/6]
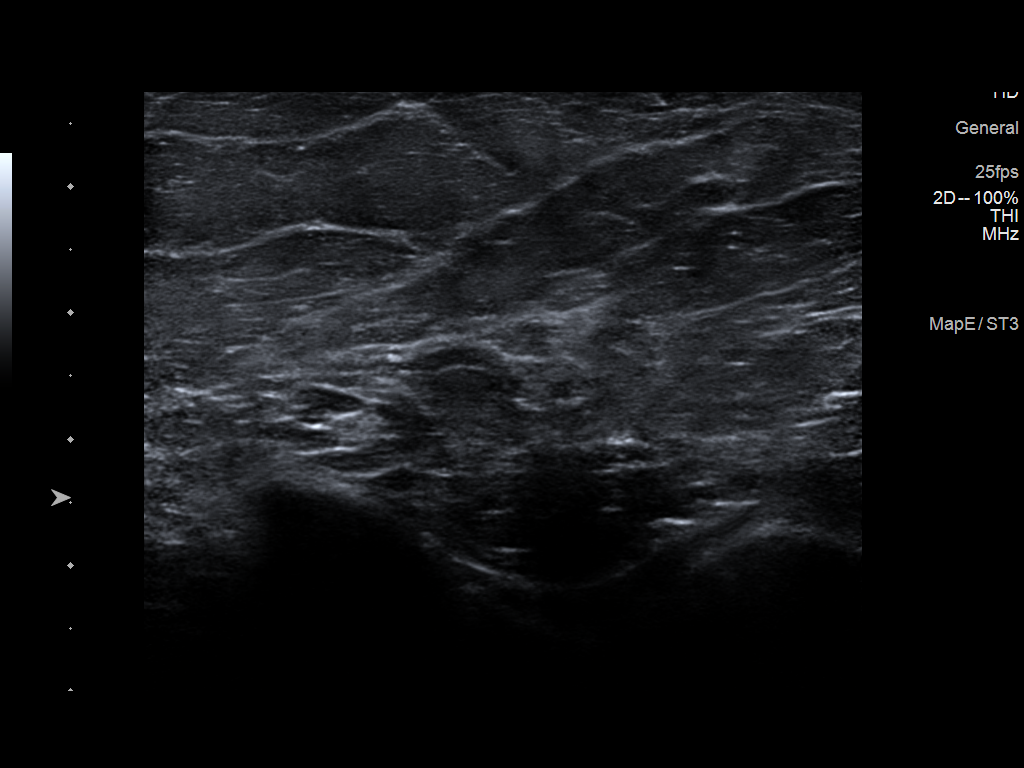
[im 6/6]
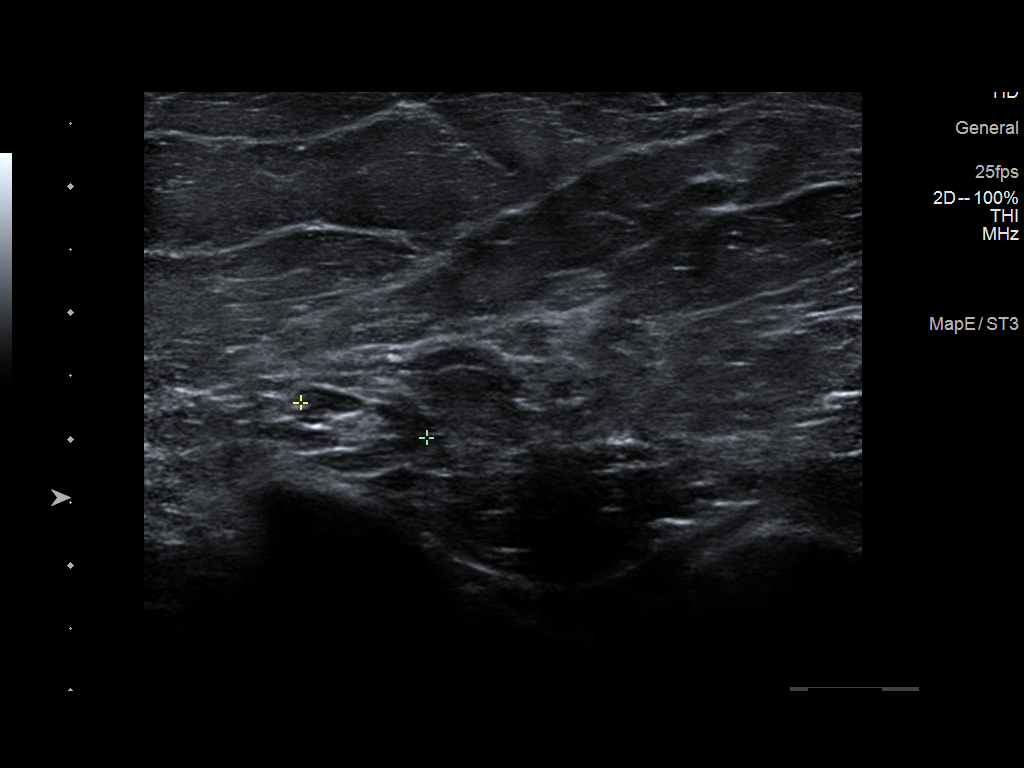

[6 of 6 positions shown; findings below may reference images not displayed]

FINDINGS: Targeted ultrasound is performed, showing a 1.1 cm intraparenchymal
lymph node posteriorly at the 9 o'clock position of the LEFT breast
10 cm from the nipple is a good correlate to the MR finding. This
lymph node has normal cortical thickness.

No suspicious abnormalities are noted within the INNER LEFT breast.
IMPRESSION: Normal benign intraparenchymal lymph node within the posterior INNER
LEFT breast, a good correlate to the MR finding.

RECOMMENDATION:
Treatment plan for known LEFT breast cancer.

I have discussed the findings and recommendations with the patient.
If applicable, a reminder letter will be sent to the patient
regarding the next appointment.

BI-RADS CATEGORY  2: Benign.

## 2020-10-29 ENCOUNTER — Encounter: Payer: Self-pay | Admitting: *Deleted

## 2020-10-30 NOTE — Telephone Encounter (Signed)
LVM that her genetic test results are available and requested that she call back to discuss them.  

## 2020-11-01 ENCOUNTER — Other Ambulatory Visit: Payer: 59

## 2020-11-01 ENCOUNTER — Ambulatory Visit: Payer: 59 | Admitting: Hematology and Oncology

## 2020-11-04 ENCOUNTER — Ambulatory Visit: Payer: Self-pay | Admitting: Genetic Counselor

## 2020-11-04 ENCOUNTER — Encounter: Payer: Self-pay | Admitting: Genetic Counselor

## 2020-11-04 DIAGNOSIS — Z1379 Encounter for other screening for genetic and chromosomal anomalies: Secondary | ICD-10-CM

## 2020-11-04 NOTE — Telephone Encounter (Signed)
Revealed negative genetic testing. Discussed that we do not know why she has breast cancer or why there is cancer in the family. It is possible that there could be a mutation in a different gene that we are not testing, or our current technology may not be able to detect certain mutations. It will therefore be important for her to stay in contact with genetics to keep up with whether additional testing may be appropriate in the future.

## 2020-11-04 NOTE — Progress Notes (Signed)
HPI:  Tammie [name] was previously seen in the [facility] due to a personal and family history of cancer and concerns regarding a hereditary predisposition to cancer. Please refer to our prior cancer genetics clinic note for more information regarding our discussion, assessment and recommendations, at the time. Tammie [name] recent genetic test results were disclosed to her, as were recommendations warranted by these results. These results and recommendations are discussed in more detail below.  CANCER HISTORY:  Oncology History  Malignant neoplasm of lower-outer quadrant of left breast of female, estrogen receptor positive ([place])  10/01/2020 Initial Diagnosis   Screening mammogram showed a left breast mass and calcifications. Diagnostic mammogram and US showed a 1.7cm and 0.5cm mass at the 5 o'clock position in the left breast, with one 0.4cm abnormal right axillary lymph node. 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   10/09/2020 Cancer Staging   Staging form: Breast, AJCC 8th Edition - Clinical stage from 10/09/2020: Stage IB (cT2, cN1, cM0, G3, ER+, PR+, HER2+) - Signed by Nicholas Lose, MD on 10/09/2020 Stage prefix: Initial diagnosis Histologic grading system: 3 grade system Percentage of positive estrogen receptors (%): 5 Percentage of positive progesterone receptors (%): 1 Ki-67 (%): 20   10/18/2020 -  Chemotherapy    Patient is on Treatment Plan: BREAST  DOCETAXEL + CARBOPLATIN + TRASTUZUMAB + PERTUZUMAB  (TCHP) Q21D       10/23/2020 Genetic Testing   Negative genetic testing:  No pathogenic variants detected on the [facility] CancerNext-Expanded + RNAinsight panel. The report date is 10/23/2020.   The CancerNext-Expanded + RNAinsight gene panel offered by [employer] and includes sequencing and rearrangement analysis for the following 77 genes: AIP, ALK, APC, ATM, AXIN2, BAP1, BARD1, BLM, BMPR1A,  BRCA1, BRCA2, BRIP1, CDC73, CDH1, CDK4, CDKN1B, CDKN2A, CHEK2, CTNNA1, DICER1, FANCC, FH, FLCN, GALNT12, KIF1B, LZTR1, MAX, MEN1, MET, MLH1, MSH2, MSH3, MSH6, MUTYH, NBN, NF1, NF2, NTHL1, PALB2, PHOX2B, PMS2, POT1, PRKAR1A, PTCH1, PTEN, RAD51C, RAD51D, RB1, RECQL, RET, SDHA, SDHAF2, SDHB, SDHC, SDHD, SMAD4, SMARCA4, SMARCB1, SMARCE1, STK11, SUFU, TMEM127, TP53, TSC1, TSC2, VHL and XRCC2 (sequencing and deletion/duplication); EGFR, EGLN1, HOXB13, KIT, MITF, PDGFRA, POLD1 and POLE (sequencing only); EPCAM and GREM1 (deletion/duplication only). RNA data is routinely analyzed for use in variant interpretation for all genes.     FAMILY HISTORY:  We obtained a detailed, 4-generation family history.  Significant diagnoses are listed below: Family History  Problem Relation Age of Onset  . Breast cancer Mother 44  . Prostate cancer Father 75  . Stomach cancer Paternal Grandmother        dx older than 81  . Leukemia Paternal Grandfather 78       dx older than 70  . Breast cancer Paternal Aunt   . Breast cancer Other        mother's first cousin    Tammie [name] does not have any children. She has one full-sister (age 68), who has not had cancer. She also has a paternal half-sister and a paternal half-brother (ages 43 and 7), although she has limited information about these family members.  Tammie [name] mother is alive at age 77 and had breast cancer at age 65. There is one maternal aunt and there are two maternal uncles. There is no known cancer among maternal aunts/uncles or maternal cousins. Tammie [name] maternal grandmother died in her 67s without cancer. Her maternal grandfather died at age 62 without  cancer. A maternal first cousin once removed (mother's maternal first cousin) had breast cancer at an unknown age.  Tammie [name] father Is alive at age 73 with prostate cancer diagnosed at age 64. There were two paternal aunts. One aunt had breast cancer at an unknown age. There is no known cancer  among paternal cousins. Tammie [name] paternal grandmother had stomach cancer older than 95. Her paternal grandfather had leukemia older than 4.   Tammie [name] is unaware of previous family history of genetic testing for hereditary cancer risks. Patient's maternal ancestors are of [place] descent, and paternal ancestors are of [place] descent. There is no reported Ashkenazi Jewish ancestry. There is no known consanguinity.  GENETIC TEST RESULTS: Genetic testing reported out on 10/23/2020 through the [facility] CancerNext-Expanded + RNAinsight panel. No pathogenic variants were detected.   The CancerNext-Expanded + RNAinsight gene panel offered by [employer] and includes sequencing and rearrangement analysis for the following 77 genes: AIP, ALK, APC, ATM, AXIN2, BAP1, BARD1, BLM, BMPR1A, BRCA1, BRCA2, BRIP1, CDC73, CDH1, CDK4, CDKN1B, CDKN2A, CHEK2, CTNNA1, DICER1, FANCC, FH, FLCN, GALNT12, KIF1B, LZTR1, MAX, MEN1, MET, MLH1, MSH2, MSH3, MSH6, MUTYH, NBN, NF1, NF2, NTHL1, PALB2, PHOX2B, PMS2, POT1, PRKAR1A, PTCH1, PTEN, RAD51C, RAD51D, RB1, RECQL, RET, SDHA, SDHAF2, SDHB, SDHC, SDHD, SMAD4, SMARCA4, SMARCB1, SMARCE1, STK11, SUFU, TMEM127, TP53, TSC1, TSC2, VHL and XRCC2 (sequencing and deletion/duplication); EGFR, EGLN1, HOXB13, KIT, MITF, PDGFRA, POLD1 and POLE (sequencing only); EPCAM and GREM1 (deletion/duplication only). RNA data is routinely analyzed for use in variant interpretation for all genes. The test report will be scanned into EPIC and located under the Molecular Pathology section of the Results Review tab.  A portion of the result report is included below for reference.     We discussed with Tammie [name] that because current genetic testing is not perfect, it is possible there may be a gene mutation in one of these genes that current testing cannot detect, but that chance is small.  We also discussed that there could be another gene that has not yet been discovered, or that we have not  yet tested, that is responsible for the cancer diagnoses in the family. It is also possible there is a hereditary cause for the cancer in the family that Tammie [name] did not inherit and therefore was not identified in her testing.  Therefore, it is important to remain in touch with cancer genetics in the future so that we can continue to offer Tammie [name] the most up to date genetic testing.   CANCER SCREENING RECOMMENDATIONS: Tammie [name] test result is considered negative (normal).  This means that we have not identified a hereditary cause for her personal and family history of cancer at this time. While reassuring, this does not definitively rule out a hereditary predisposition to cancer. It is still possible that there could be genetic mutations that are undetectable by current technology. There could be genetic mutations in genes that have not been tested or identified to increase cancer risk.  Therefore, it is recommended she continue to follow the cancer management and screening guidelines provided by her oncology and primary healthcare provider.   An individual's cancer risk and medical management are not determined by genetic test results alone. Overall cancer risk assessment incorporates additional factors, including personal medical history, family history, and any available genetic information that may result in a personalized plan for cancer prevention and surveillance.  RECOMMENDATIONS FOR FAMILY MEMBERS:  Individuals in this family might be at some increased risk  of developing cancer, over the general population risk, simply due to the family history of cancer.  We recommended women in this family have a yearly mammogram beginning at age 15, or 9 years younger than the earliest onset of cancer, an annual clinical breast exam, and perform monthly breast self-exams. Women in this family should also have a gynecological exam as recommended by their primary provider. All family members should be  referred for colonoscopy starting at age 50.  FOLLOW-UP: Lastly, we discussed with Ms. Fairbank that cancer genetics is a rapidly advancing field and it is possible that new genetic tests will be appropriate for her and/or her family members in the future. We encouraged her to remain in contact with cancer genetics on an annual basis so we can update her personal and family histories and let her know of advances in cancer genetics that may benefit this family.   Our contact number was provided. Tammie [name] questions were answered to her satisfaction, and she knows she is welcome to call us at anytime with additional questions or concerns.   Clint Guy, MS, [facility] Genetic Counselor [name] Phone: [phone]

## 2020-11-08 ENCOUNTER — Inpatient Hospital Stay: Payer: 59

## 2020-11-08 ENCOUNTER — Inpatient Hospital Stay: Payer: 59 | Admitting: Dietician

## 2020-11-08 ENCOUNTER — Inpatient Hospital Stay (HOSPITAL_BASED_OUTPATIENT_CLINIC_OR_DEPARTMENT_OTHER): Payer: 59 | Admitting: Hematology and Oncology

## 2020-11-08 ENCOUNTER — Other Ambulatory Visit: Payer: Self-pay

## 2020-11-08 ENCOUNTER — Other Ambulatory Visit: Payer: Self-pay | Admitting: *Deleted

## 2020-11-08 VITALS — BP 107/61 | HR 80 | Resp 16

## 2020-11-08 DIAGNOSIS — C50512 Malignant neoplasm of lower-outer quadrant of left female breast: Secondary | ICD-10-CM

## 2020-11-08 DIAGNOSIS — Z95828 Presence of other vascular implants and grafts: Secondary | ICD-10-CM

## 2020-11-08 DIAGNOSIS — Z170 Estrogen receptor positive status [ER+]: Secondary | ICD-10-CM

## 2020-11-08 DIAGNOSIS — Z5111 Encounter for antineoplastic chemotherapy: Secondary | ICD-10-CM | POA: Diagnosis not present

## 2020-11-08 LAB — CBC WITH DIFFERENTIAL/PLATELET
Abs Immature Granulocytes: 0.07 10*3/uL (ref 0.00–0.07)
Basophils Absolute: 0 10*3/uL (ref 0.0–0.1)
Basophils Relative: 0 %
Eosinophils Absolute: 0 10*3/uL (ref 0.0–0.5)
Eosinophils Relative: 0 %
HCT: 29.9 % — ABNORMAL LOW (ref 36.0–46.0)
Hemoglobin: 10.4 g/dL — ABNORMAL LOW (ref 12.0–15.0)
Immature Granulocytes: 1 %
Lymphocytes Relative: 10 %
Lymphs Abs: 1.1 10*3/uL (ref 0.7–4.0)
MCH: 29.9 pg (ref 26.0–34.0)
MCHC: 34.8 g/dL (ref 30.0–36.0)
MCV: 85.9 fL (ref 80.0–100.0)
Monocytes Absolute: 1 10*3/uL (ref 0.1–1.0)
Monocytes Relative: 9 %
Neutro Abs: 9.3 10*3/uL — ABNORMAL HIGH (ref 1.7–7.7)
Neutrophils Relative %: 80 %
Platelets: 288 10*3/uL (ref 150–400)
RBC: 3.48 MIL/uL — ABNORMAL LOW (ref 3.87–5.11)
RDW: 14.5 % (ref 11.5–15.5)
WBC: 11.5 10*3/uL — ABNORMAL HIGH (ref 4.0–10.5)
nRBC: 0 % (ref 0.0–0.2)

## 2020-11-08 LAB — COMPREHENSIVE METABOLIC PANEL
ALT: 17 U/L (ref 0–44)
AST: 13 U/L — ABNORMAL LOW (ref 15–41)
Albumin: 3.8 g/dL (ref 3.5–5.0)
Alkaline Phosphatase: 53 U/L (ref 38–126)
Anion gap: 11 (ref 5–15)
BUN: 15 mg/dL (ref 6–20)
CO2: 25 mmol/L (ref 22–32)
Calcium: 9.5 mg/dL (ref 8.9–10.3)
Chloride: 104 mmol/L (ref 98–111)
Creatinine, Ser: 0.67 mg/dL (ref 0.44–1.00)
GFR, Estimated: >60 mL/min (ref >60)
Glucose, Bld: 132 mg/dL — ABNORMAL HIGH (ref 70–99)
Potassium: 3.8 mmol/L (ref 3.5–5.1)
Sodium: 140 mmol/L (ref 135–145)
Total Bilirubin: <0.2 mg/dL — ABNORMAL LOW (ref 0.3–1.2)
Total Protein: 6.6 g/dL (ref 6.5–8.1)

## 2020-11-08 LAB — PREGNANCY, URINE: Preg Test, Ur: NEGATIVE

## 2020-11-08 MED ORDER — SODIUM CHLORIDE 0.9 % IV SOLN
75.0000 mg/m2 | Freq: Once | INTRAVENOUS | Status: AC
  Administered 2020-11-08: 140 mg via INTRAVENOUS
  Filled 2020-11-08: qty 14

## 2020-11-08 MED ORDER — SODIUM CHLORIDE 0.9 % IV SOLN
700.0000 mg | Freq: Once | INTRAVENOUS | Status: AC
  Administered 2020-11-08: 700 mg via INTRAVENOUS
  Filled 2020-11-08: qty 70

## 2020-11-08 MED ORDER — ACETAMINOPHEN 325 MG PO TABS
650.0000 mg | ORAL_TABLET | Freq: Once | ORAL | Status: AC
  Administered 2020-11-08: 650 mg via ORAL

## 2020-11-08 MED ORDER — SODIUM CHLORIDE 0.9% FLUSH
10.0000 mL | INTRAVENOUS | Status: AC | PRN
  Administered 2020-11-08: 10 mL
  Filled 2020-11-08: qty 10

## 2020-11-08 MED ORDER — SODIUM CHLORIDE 0.9% FLUSH
10.0000 mL | INTRAVENOUS | Status: DC | PRN
  Administered 2020-11-08: 10 mL
  Filled 2020-11-08: qty 10

## 2020-11-08 MED ORDER — DIPHENHYDRAMINE HCL 25 MG PO CAPS
ORAL_CAPSULE | ORAL | Status: AC
  Filled 2020-11-08: qty 2

## 2020-11-08 MED ORDER — SODIUM CHLORIDE 0.9 % IV SOLN
Freq: Once | INTRAVENOUS | Status: AC
  Filled 2020-11-08: qty 250

## 2020-11-08 MED ORDER — TRASTUZUMAB-ANNS CHEMO 150 MG IV SOLR
6.0000 mg/kg | Freq: Once | INTRAVENOUS | Status: AC
  Administered 2020-11-08: 441 mg via INTRAVENOUS
  Filled 2020-11-08: qty 21

## 2020-11-08 MED ORDER — SODIUM CHLORIDE 0.9 % IV SOLN
420.0000 mg | Freq: Once | INTRAVENOUS | Status: AC
  Administered 2020-11-08: 420 mg via INTRAVENOUS
  Filled 2020-11-08: qty 14

## 2020-11-08 MED ORDER — PALONOSETRON HCL INJECTION 0.25 MG/5ML
0.2500 mg | Freq: Once | INTRAVENOUS | Status: AC
  Administered 2020-11-08: 0.25 mg via INTRAVENOUS

## 2020-11-08 MED ORDER — HEPARIN SOD (PORK) LOCK FLUSH 100 UNIT/ML IV SOLN
500.0000 [IU] | Freq: Once | INTRAVENOUS | Status: AC | PRN
  Administered 2020-11-08: 500 [IU]
  Filled 2020-11-08: qty 5

## 2020-11-08 MED ORDER — PALONOSETRON HCL INJECTION 0.25 MG/5ML
INTRAVENOUS | Status: AC
  Filled 2020-11-08: qty 5

## 2020-11-08 MED ORDER — ACETAMINOPHEN 325 MG PO TABS
ORAL_TABLET | ORAL | Status: AC
  Filled 2020-11-08: qty 2

## 2020-11-08 MED ORDER — SODIUM CHLORIDE 0.9 % IV SOLN
150.0000 mg | Freq: Once | INTRAVENOUS | Status: AC
  Administered 2020-11-08: 150 mg via INTRAVENOUS
  Filled 2020-11-08: qty 150

## 2020-11-08 MED ORDER — SODIUM CHLORIDE 0.9 % IV SOLN
10.0000 mg | Freq: Once | INTRAVENOUS | Status: AC
  Administered 2020-11-08: 10 mg via INTRAVENOUS
  Filled 2020-11-08: qty 10

## 2020-11-08 MED ORDER — DIPHENHYDRAMINE HCL 25 MG PO CAPS
50.0000 mg | ORAL_CAPSULE | Freq: Once | ORAL | Status: AC
  Administered 2020-11-08: 50 mg via ORAL

## 2020-11-08 NOTE — Patient Instructions (Signed)
[facility]  Discharge Instructions: Thank you for choosing [facility] to provide your oncology and hematology care.   If you have a lab appointment with the [facility], please go directly to the [facility] and check in at the registration area.   Wear comfortable clothing and clothing appropriate for easy access to any Portacath or PICC line.   We strive to give you quality time with your provider. You may need to reschedule your appointment if you arrive late (15 or more minutes).  Arriving late affects you and other patients whose appointments are after yours.  Also, if you miss three or more appointments without notifying the office, you may be dismissed from the clinic at the provider's discretion.      For prescription refill requests, have your pharmacy contact our office and allow 72 hours for refills to be completed.    Today you received the following chemotherapy and/or immunotherapy agents Kanjinti, Perjeta, Taxotere, Carboplatin   To help prevent nausea and vomiting after your treatment, we encourage you to take your nausea medication as directed.  BELOW ARE SYMPTOMS THAT SHOULD BE REPORTED IMMEDIATELY: . *FEVER GREATER THAN 100.4 F (38 C) OR HIGHER . *CHILLS OR SWEATING . *NAUSEA AND VOMITING THAT IS NOT CONTROLLED WITH YOUR NAUSEA MEDICATION . *UNUSUAL SHORTNESS OF BREATH . *UNUSUAL BRUISING OR BLEEDING . *URINARY PROBLEMS (pain or burning when urinating, or frequent urination) . *BOWEL PROBLEMS (unusual diarrhea, constipation, pain near the anus) . TENDERNESS IN MOUTH AND THROAT WITH OR WITHOUT PRESENCE OF ULCERS (sore throat, sores in mouth, or a toothache) . UNUSUAL RASH, SWELLING OR PAIN  . UNUSUAL VAGINAL DISCHARGE OR ITCHING   Items with * indicate a potential emergency and should be followed up as soon as possible or go to the Emergency Department if any problems should occur.  Please show the CHEMOTHERAPY ALERT  CARD or IMMUNOTHERAPY ALERT CARD at check-in to the Emergency Department and triage nurse.  Should you have questions after your visit or need to cancel or reschedule your appointment, please contact [facility]  Dept: [phone]  and follow the prompts.  Office hours are 8:00 a.m. to 4:30 p.m. Monday - Friday. Please note that voicemails left after 4:00 p.m. may not be returned until the following business day.  We are closed weekends and major holidays. You have access to a nurse at all times for urgent questions. Please call the main number to the clinic Dept: [phone] and follow the prompts.   For any non-urgent questions, you may also contact your provider using MyChart. We now offer e-Visits for anyone 37 and older to request care online for non-urgent symptoms. For details visit [email or web address].   Also download the MyChart app! Go to the app store, search "MyChart", open the app, select [facility], and log in with your MyChart username and password.  Due to Covid, a mask is required upon entering the hospital/clinic. If you do not have a mask, one will be given to you upon arrival. For doctor visits, patients may have 1 support person aged 20 or older with them. For treatment visits, patients cannot have anyone with them due to current Covid guidelines and our immunocompromised population.   Trastuzumab injection for infusion What is this medicine? TRASTUZUMAB (tras TOO zoo mab) is a monoclonal antibody. It is used to treat breast cancer and stomach cancer. This medicine may be used for other purposes; ask your health care  provider or pharmacist if you have questions. COMMON BRAND NAME(S): Herceptin, Galvin Proffer, Trazimera What should I tell my health care provider before I take this medicine? They need to know if you have any of these conditions:  heart disease  heart failure  lung or breathing disease, like  asthma  an unusual or allergic reaction to trastuzumab, benzyl alcohol, or other medications, foods, dyes, or preservatives  pregnant or trying to get pregnant  breast-feeding How should I use this medicine? This drug is given as an infusion into a vein. It is administered in a hospital or clinic by a specially trained health care professional. Talk to your pediatrician regarding the use of this medicine in children. This medicine is not approved for use in children. Overdosage: If you think you have taken too much of this medicine contact a poison control center or emergency room at once. NOTE: This medicine is only for you. Do not share this medicine with others. What if I miss a dose? It is important not to miss a dose. Call your doctor or health care professional if you are unable to keep an appointment. What may interact with this medicine? This medicine may interact with the following medications:  certain types of chemotherapy, such as daunorubicin, doxorubicin, epirubicin, and idarubicin This list may not describe all possible interactions. Give your health care provider a list of all the medicines, herbs, non-prescription drugs, or dietary supplements you use. Also tell them if you smoke, drink alcohol, or use illegal drugs. Some items may interact with your medicine. What should I watch for while using this medicine? Visit your doctor for checks on your progress. Report any side effects. Continue your course of treatment even though you feel ill unless your doctor tells you to stop. Call your doctor or health care professional for advice if you get a fever, chills or sore throat, or other symptoms of a cold or flu. Do not treat yourself. Try to avoid being around people who are sick. You may experience fever, chills and shaking during your first infusion. These effects are usually mild and can be treated with other medicines. Report any side effects during the infusion to your health  care professional. Fever and chills usually do not happen with later infusions. Do not become pregnant while taking this medicine or for 7 months after stopping it. Women should inform their doctor if they wish to become pregnant or think they might be pregnant. Women of child-bearing potential will need to have a negative pregnancy test before starting this medicine. There is a potential for serious side effects to an unborn child. Talk to your health care professional or pharmacist for more information. Do not breast-feed an infant while taking this medicine or for 7 months after stopping it. Women must use effective birth control with this medicine. What side effects may I notice from receiving this medicine? Side effects that you should report to your doctor or health care professional as soon as possible:  allergic reactions like skin rash, itching or hives, swelling of the face, lips, or tongue  chest pain or palpitations  cough  dizziness  feeling faint or lightheaded, falls  fever  general ill feeling or flu-like symptoms  signs of worsening heart failure like breathing problems; swelling in your legs and feet  unusually weak or tired Side effects that usually do not require medical attention (report to your doctor or health care professional if they continue or are bothersome):  bone  pain  changes in taste  diarrhea  joint pain  nausea/vomiting  weight loss This list may not describe all possible side effects. Call your doctor for medical advice about side effects. You may report side effects to FDA at 1-800-FDA-1088. Where should I keep my medicine? This drug is given in a hospital or clinic and will not be stored at home. NOTE: This sheet is a summary. It may not cover all possible information. If you have questions about this medicine, talk to your doctor, pharmacist, or health care provider.  2021 Elsevier/Gold Standard (2016-05-26 14:37:52)  Pertuzumab  injection What is this medicine? PERTUZUMAB (per TOOZ ue mab) is a monoclonal antibody. It is used to treat breast cancer. This medicine may be used for other purposes; ask your health care provider or pharmacist if you have questions. COMMON BRAND NAME(S): PERJETA What should I tell my health care provider before I take this medicine? They need to know if you have any of these conditions:  heart disease  heart failure  high blood pressure  history of irregular heart beat  recent or ongoing radiation therapy  an unusual or allergic reaction to pertuzumab, other medicines, foods, dyes, or preservatives  pregnant or trying to get pregnant  breast-feeding How should I use this medicine? This medicine is for infusion into a vein. It is given by a health care professional in a hospital or clinic setting. Talk to your pediatrician regarding the use of this medicine in children. Special care may be needed. Overdosage: If you think you have taken too much of this medicine contact a poison control center or emergency room at once. NOTE: This medicine is only for you. Do not share this medicine with others. What if I miss a dose? It is important not to miss your dose. Call your doctor or health care professional if you are unable to keep an appointment. What may interact with this medicine? Interactions are not expected. Give your health care provider a list of all the medicines, herbs, non-prescription drugs, or dietary supplements you use. Also tell them if you smoke, drink alcohol, or use illegal drugs. Some items may interact with your medicine. This list may not describe all possible interactions. Give your health care provider a list of all the medicines, herbs, non-prescription drugs, or dietary supplements you use. Also tell them if you smoke, drink alcohol, or use illegal drugs. Some items may interact with your medicine. What should I watch for while using this medicine? Your  condition will be monitored carefully while you are receiving this medicine. Report any side effects. Continue your course of treatment even though you feel ill unless your doctor tells you to stop. Do not become pregnant while taking this medicine or for 7 months after stopping it. Women should inform their doctor if they wish to become pregnant or think they might be pregnant. Women of child-bearing potential will need to have a negative pregnancy test before starting this medicine. There is a potential for serious side effects to an unborn child. Talk to your health care professional or pharmacist for more information. Do not breast-feed an infant while taking this medicine or for 7 months after stopping it. Women must use effective birth control with this medicine. Call your doctor or health care professional for advice if you get a fever, chills or sore throat, or other symptoms of a cold or flu. Do not treat yourself. Try to avoid being around people who are sick. You may experience  fever, chills, and headache during the infusion. Report any side effects during the infusion to your health care professional. What side effects may I notice from receiving this medicine? Side effects that you should report to your doctor or health care professional as soon as possible:  breathing problems  chest pain or palpitations  dizziness  feeling faint or lightheaded  fever or chills  skin rash, itching or hives  sore throat  swelling of the face, lips, or tongue  swelling of the legs or ankles  unusually weak or tired Side effects that usually do not require medical attention (report to your doctor or health care professional if they continue or are bothersome):  diarrhea  hair loss  nausea, vomiting  tiredness This list may not describe all possible side effects. Call your doctor for medical advice about side effects. You may report side effects to FDA at 1-800-FDA-1088. Where should I  keep my medicine? This drug is given in a hospital or clinic and will not be stored at home. NOTE: This sheet is a summary. It may not cover all possible information. If you have questions about this medicine, talk to your doctor, pharmacist, or health care provider.  2021 Elsevier/Gold Standard (2015-07-04 12:08:50)  Docetaxel injection What is this medicine? DOCETAXEL (doe se TAX el) is a chemotherapy drug. It targets fast dividing cells, like cancer cells, and causes these cells to die. This medicine is used to treat many types of cancers like breast cancer, certain stomach cancers, head and neck cancer, lung cancer, and prostate cancer. This medicine may be used for other purposes; ask your health care provider or pharmacist if you have questions. COMMON BRAND NAME(S): Docefrez, Taxotere What should I tell my health care provider before I take this medicine? They need to know if you have any of these conditions:  infection (especially a virus infection such as chickenpox, cold sores, or herpes)  liver disease  low blood counts, like low white cell, platelet, or red cell counts  an unusual or allergic reaction to docetaxel, polysorbate 80, other chemotherapy agents, other medicines, foods, dyes, or preservatives  pregnant or trying to get pregnant  breast-feeding How should I use this medicine? This drug is given as an infusion into a vein. It is administered in a hospital or clinic by a specially trained health care professional. Talk to your pediatrician regarding the use of this medicine in children. Special care may be needed. Overdosage: If you think you have taken too much of this medicine contact a poison control center or emergency room at once. NOTE: This medicine is only for you. Do not share this medicine with others. What if I miss a dose? It is important not to miss your dose. Call your doctor or health care professional if you are unable to keep an appointment. What  may interact with this medicine? Do not take this medicine with any of the following medications:  live virus vaccines This medicine may also interact with the following medications:  aprepitant  certain antibiotics like erythromycin or clarithromycin  certain antivirals for HIV or hepatitis  certain medicines for fungal infections like fluconazole, itraconazole, ketoconazole, posaconazole, or voriconazole  cimetidine  ciprofloxacin  conivaptan  cyclosporine  dronedarone  fluvoxamine  grapefruit juice  imatinib  verapamil This list may not describe all possible interactions. Give your health care provider a list of all the medicines, herbs, non-prescription drugs, or dietary supplements you use. Also tell them if you smoke, drink alcohol, or  use illegal drugs. Some items may interact with your medicine. What should I watch for while using this medicine? Your condition will be monitored carefully while you are receiving this medicine. You will need important blood work done while you are taking this medicine. Call your doctor or health care professional for advice if you get a fever, chills or sore throat, or other symptoms of a cold or flu. Do not treat yourself. This drug decreases your body's ability to fight infections. Try to avoid being around people who are sick. Some products may contain alcohol. Ask your health care professional if this medicine contains alcohol. Be sure to tell all health care professionals you are taking this medicine. Certain medicines, like metronidazole and disulfiram, can cause an unpleasant reaction when taken with alcohol. The reaction includes flushing, headache, nausea, vomiting, sweating, and increased thirst. The reaction can last from 30 minutes to several hours. You may get drowsy or dizzy. Do not drive, use machinery, or do anything that needs mental alertness until you know how this medicine affects you. Do not stand or sit up quickly,  especially if you are an older patient. This reduces the risk of dizzy or fainting spells. Alcohol may interfere with the effect of this medicine. Talk to your health care professional about your risk of cancer. You may be more at risk for certain types of cancer if you take this medicine. Do not become pregnant while taking this medicine or for 6 months after stopping it. Women should inform their doctor if they wish to become pregnant or think they might be pregnant. There is a potential for serious side effects to an unborn child. Talk to your health care professional or pharmacist for more information. Do not breast-feed an infant while taking this medicine or for 1 week after stopping it. Males who get this medicine must use a condom during sex with females who can get pregnant. If you get a woman pregnant, the baby could have birth defects. The baby could die before they are born. You will need to continue wearing a condom for 3 months after stopping the medicine. Tell your health care provider right away if your partner becomes pregnant while you are taking this medicine. This may interfere with the ability to father a child. You should talk to your doctor or health care professional if you are concerned about your fertility. What side effects may I notice from receiving this medicine? Side effects that you should report to your doctor or health care professional as soon as possible:  allergic reactions like skin rash, itching or hives, swelling of the face, lips, or tongue  blurred vision  breathing problems  changes in vision  low blood counts - This drug may decrease the number of white blood cells, red blood cells and platelets. You may be at increased risk for infections and bleeding.  nausea and vomiting  pain, redness or irritation at site where injected  pain, tingling, numbness in the hands or feet  redness, blistering, peeling, or loosening of the skin, including inside the  mouth  signs of decreased platelets or bleeding - bruising, pinpoint red spots on the skin, black, tarry stools, nosebleeds  signs of decreased red blood cells - unusually weak or tired, fainting spells, lightheadedness  signs of infection - fever or chills, cough, sore throat, pain or difficulty passing urine  swelling of the ankle, feet, hands Side effects that usually do not require medical attention (report to your doctor or  health care professional if they continue or are bothersome):  constipation  diarrhea  fingernail or toenail changes  hair loss  loss of appetite  mouth sores  muscle pain This list may not describe all possible side effects. Call your doctor for medical advice about side effects. You may report side effects to FDA at 1-800-FDA-1088. Where should I keep my medicine? This drug is given in a hospital or clinic and will not be stored at home. NOTE: This sheet is a summary. It may not cover all possible information. If you have questions about this medicine, talk to your doctor, pharmacist, or health care provider.  2021 Elsevier/Gold Standard (2019-05-01 19:50:31)  Carboplatin injection What is this medicine? CARBOPLATIN (KAR boe pla tin) is a chemotherapy drug. It targets fast dividing cells, like cancer cells, and causes these cells to die. This medicine is used to treat ovarian cancer and many other cancers. This medicine may be used for other purposes; ask your health care provider or pharmacist if you have questions. COMMON BRAND NAME(S): Paraplatin What should I tell my health care provider before I take this medicine? They need to know if you have any of these conditions:  blood disorders  hearing problems  kidney disease  recent or ongoing radiation therapy  an unusual or allergic reaction to carboplatin, cisplatin, other chemotherapy, other medicines, foods, dyes, or preservatives  pregnant or trying to get  pregnant  breast-feeding How should I use this medicine? This drug is usually given as an infusion into a vein. It is administered in a hospital or clinic by a specially trained health care professional. Talk to your pediatrician regarding the use of this medicine in children. Special care may be needed. Overdosage: If you think you have taken too much of this medicine contact a poison control center or emergency room at once. NOTE: This medicine is only for you. Do not share this medicine with others. What if I miss a dose? It is important not to miss a dose. Call your doctor or health care professional if you are unable to keep an appointment. What may interact with this medicine?  medicines for seizures  medicines to increase blood counts like filgrastim, pegfilgrastim, sargramostim  some antibiotics like amikacin, gentamicin, neomycin, streptomycin, tobramycin  vaccines Talk to your doctor or health care professional before taking any of these medicines:  acetaminophen  aspirin  ibuprofen  ketoprofen  naproxen This list may not describe all possible interactions. Give your health care provider a list of all the medicines, herbs, non-prescription drugs, or dietary supplements you use. Also tell them if you smoke, drink alcohol, or use illegal drugs. Some items may interact with your medicine. What should I watch for while using this medicine? Your condition will be monitored carefully while you are receiving this medicine. You will need important blood work done while you are taking this medicine. This drug may make you feel generally unwell. This is not uncommon, as chemotherapy can affect healthy cells as well as cancer cells. Report any side effects. Continue your course of treatment even though you feel ill unless your doctor tells you to stop. In some cases, you may be given additional medicines to help with side effects. Follow all directions for their use. Call your  doctor or health care professional for advice if you get a fever, chills or sore throat, or other symptoms of a cold or flu. Do not treat yourself. This drug decreases your body's ability to fight infections. Try  to avoid being around people who are sick. This medicine may increase your risk to bruise or bleed. Call your doctor or health care professional if you notice any unusual bleeding. Be careful brushing and flossing your teeth or using a toothpick because you may get an infection or bleed more easily. If you have any dental work done, tell your dentist you are receiving this medicine. Avoid taking products that contain aspirin, acetaminophen, ibuprofen, naproxen, or ketoprofen unless instructed by your doctor. These medicines may hide a fever. Do not become pregnant while taking this medicine. Women should inform their doctor if they wish to become pregnant or think they might be pregnant. There is a potential for serious side effects to an unborn child. Talk to your health care professional or pharmacist for more information. Do not breast-feed an infant while taking this medicine. What side effects may I notice from receiving this medicine? Side effects that you should report to your doctor or health care professional as soon as possible:  allergic reactions like skin rash, itching or hives, swelling of the face, lips, or tongue  signs of infection - fever or chills, cough, sore throat, pain or difficulty passing urine  signs of decreased platelets or bleeding - bruising, pinpoint red spots on the skin, black, tarry stools, nosebleeds  signs of decreased red blood cells - unusually weak or tired, fainting spells, lightheadedness  breathing problems  changes in hearing  changes in vision  chest pain  high blood pressure  low blood counts - This drug may decrease the number of white blood cells, red blood cells and platelets. You may be at increased risk for infections and  bleeding.  nausea and vomiting  pain, swelling, redness or irritation at the injection site  pain, tingling, numbness in the hands or feet  problems with balance, talking, walking  trouble passing urine or change in the amount of urine Side effects that usually do not require medical attention (report to your doctor or health care professional if they continue or are bothersome):  hair loss  loss of appetite  metallic taste in the mouth or changes in taste This list may not describe all possible side effects. Call your doctor for medical advice about side effects. You may report side effects to FDA at 1-800-FDA-1088. Where should I keep my medicine? This drug is given in a hospital or clinic and will not be stored at home. NOTE: This sheet is a summary. It may not cover all possible information. If you have questions about this medicine, talk to your doctor, pharmacist, or health care provider.  2021 Elsevier/Gold Standard (2007-09-06 14:38:05)

## 2020-11-08 NOTE — Assessment & Plan Note (Signed)
10/01/2020:Screening mammogram showed a left breast mass and calcifications. Diagnostic mammogram and US showed a 1.7cm and 0.5cm mass at the 5 o'clock position in the left breast, with one 0.4cm abnormal right axillary lymph node. 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  Treatment Plan: 1. Neoadjuvant chemotherapy with TCH Perjeta 6 cycles followed by Herceptin Perjeta maintenance versus Kadcyla maintenance (based on response to neoadjuvant chemo) for 1 year 2. Followed by breast conserving surgery if possible with sentinel lymph node study 3. Followed by adjuvant radiation therapy 4.Followed by antiestrogen therapy although ER is weak URCC nausea study Breast MRI: 10/17/20:2.4 cm left breast mass, intramammary lymph node (needs second look ultrasound and biopsy) ------------------------------------------------------------------------------------------------------------------------- Current treatment: Cycle 2TCHP Chemo Toxicities: 1.  Nausea: Intermittent in nature.  I discussed with her about taking preventative nausea medication from day 4 today 7 2. intermittent diarrhea once or twice per day: She took some Imodium and it has helped her. 3.  Dizziness: Her blood pressure is running low.  She tells me that she took extra metoprolol yesterday.  This is because of increased heart rate issues.  She has not been drinking enough water and encouraged her to drink more water   She is drinking a beets and fruits containing juices and she thinks that is helping her significantly.  RTC in 3 weeks for cycle 3

## 2020-11-08 NOTE — Progress Notes (Signed)
Patient Care Team: [name], FNP as PCP - General (Family Medicine) Rockwell Germany, RN as Oncology Nurse Navigator Mauro Kaufmann, RN as Oncology Nurse Navigator Donnie Mesa, MD as Consulting Physician (General Surgery) Nicholas Lose, MD as Consulting Physician (Hematology and Oncology) Gery Pray, MD as Consulting Physician (Radiation Oncology)  DIAGNOSIS:  Encounter Diagnosis  Name Primary?  . Malignant neoplasm of lower-outer quadrant of left breast of female, estrogen receptor positive ([place])     SUMMARY OF ONCOLOGIC HISTORY: Oncology History  Malignant neoplasm of lower-outer quadrant of left breast of female, estrogen receptor positive ([place])  10/01/2020 Initial Diagnosis   Screening mammogram showed a left breast mass and calcifications. Diagnostic mammogram and US showed a 1.7cm and 0.5cm mass at the 5 o'clock position in the left breast, with one 0.4cm abnormal right axillary lymph node. 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   10/09/2020 Cancer Staging   Staging form: Breast, AJCC 8th Edition - Clinical stage from 10/09/2020: Stage IB (cT2, cN1, cM0, G3, ER+, PR+, HER2+) - Signed by Nicholas Lose, MD on 10/09/2020 Stage prefix: Initial diagnosis Histologic grading system: 3 grade system Percentage of positive estrogen receptors (%): 5 Percentage of positive progesterone receptors (%): 1 Ki-67 (%): 20   10/18/2020 -  Chemotherapy    Patient is on Treatment Plan: BREAST  DOCETAXEL + CARBOPLATIN + TRASTUZUMAB + PERTUZUMAB  (TCHP) Q21D       10/23/2020 Genetic Testing   Negative genetic testing:  No pathogenic variants detected on the [employer] CancerNext-Expanded + RNAinsight panel. The report date is 10/23/2020.   The CancerNext-Expanded + RNAinsight gene panel offered by [employer] and includes sequencing and rearrangement analysis for the following 77 genes: AIP, ALK, APC, ATM, AXIN2,  BAP1, BARD1, BLM, BMPR1A, BRCA1, BRCA2, BRIP1, CDC73, CDH1, CDK4, CDKN1B, CDKN2A, CHEK2, CTNNA1, DICER1, FANCC, FH, FLCN, GALNT12, KIF1B, LZTR1, MAX, MEN1, MET, MLH1, MSH2, MSH3, MSH6, MUTYH, NBN, NF1, NF2, NTHL1, PALB2, PHOX2B, PMS2, POT1, PRKAR1A, PTCH1, PTEN, RAD51C, RAD51D, RB1, RECQL, RET, SDHA, SDHAF2, SDHB, SDHC, SDHD, SMAD4, SMARCA4, SMARCB1, SMARCE1, STK11, SUFU, TMEM127, TP53, TSC1, TSC2, VHL and XRCC2 (sequencing and deletion/duplication); EGFR, EGLN1, HOXB13, KIT, MITF, PDGFRA, POLD1 and POLE (sequencing only); EPCAM and GREM1 (deletion/duplication only). RNA data is routinely analyzed for use in variant interpretation for all genes.     CHIEF COMPLIANT: Cycle 2 TCH Perjeta  INTERVAL HISTORY: Tammie [name] is a 45 year old above-mentioned history of left breast cancer who is currently on neoadjuvant chemotherapy with [place].  Today is cycle 2 of treatment.  Overall she done extremely well from cycle 1.  She had mild nausea and 1 day of emesis but did not have any further problems since.  She had couple of days of loose stools but did not lead to diarrhea.  Denies any abdominal pain or mouth sores.   ALLERGIES:  has No Known Allergies.  MEDICATIONS:  Current Outpatient Medications  Medication Sig Dispense Refill  . Ascorbic Acid (VITAMIN C) 1000 MG tablet Take 1,000 mg by mouth daily.    . Cholecalciferol (DIALYVITE VITAMIN D 5000) 125 MCG (5000 UT) capsule Take 5,000 Units by mouth daily.    . citalopram (CELEXA) 20 MG tablet Take 20 mg by mouth daily.    Marland Kitchen dexamethasone (DECADRON) 4 MG tablet Take 1 tablet (4 mg total) by mouth 2 (two) times daily. Take 1 tablet day before chemo and 1 tablet day  after chemo with food 12 tablet 0  . ibuprofen (ADVIL) 800 MG tablet Take 800 mg by mouth 2 (two) times daily.    Marland Kitchen lidocaine-prilocaine (EMLA) cream Apply to affected area once 30 g 3  . lisinopril (ZESTRIL) 20 MG tablet Take 1 tablet by mouth daily.    . magnesium gluconate  (MAGONATE) 500 MG tablet Take 500 mg by mouth 2 (two) times daily.    . metoprolol tartrate (LOPRESSOR) 25 MG tablet Take 25 mg by mouth daily.    . NON FORMULARY Take 900 mg by mouth daily. Hyaluronic Acid Complex    . ondansetron (ZOFRAN) 8 MG tablet Take 1 tablet (8 mg total) by mouth 2 (two) times daily as needed (Nausea or vomiting). Start on the third day after chemotherapy. 30 tablet 1  . OVER THE COUNTER MEDICATION Take 4 capsules by mouth daily. Laminine supplement- 620 mg    . oxyCODONE (OXY IR/ROXICODONE) 5 MG immediate release tablet Take 1 tablet (5 mg total) by mouth every 6 (six) hours as needed for severe pain. 15 tablet 0  . prochlorperazine (COMPAZINE) 10 MG tablet Take 1 tablet (10 mg total) by mouth every 6 (six) hours as needed (Nausea or vomiting). 30 tablet 1  . terbinafine (LAMISIL) 250 MG tablet Take 250 mg by mouth daily.     No current facility-administered medications for this visit.    PHYSICAL EXAMINATION: ECOG PERFORMANCE STATUS: 1 - Symptomatic but completely ambulatory  Vitals:   11/08/20 0933  BP: 117/71  Pulse: 80  Resp: 17  Temp: 97.6 F (36.4 C)  SpO2: 100%   Filed Weights   11/08/20 0933  Weight: 159 lb 9.6 oz (72.4 kg)      LABORATORY DATA:  I have reviewed the data as listed CMP Latest Ref Rng & Units 10/25/2020 10/18/2020 10/09/2020  Glucose 70 - 99 mg/dL 105(H) 136(H) 106(H)  BUN 6 - 20 mg/dL 27(H) 11 18  Creatinine 0.44 - 1.00 mg/dL 0.95 0.76 0.73  Sodium 135 - 145 mmol/L 134(L) 140 139  Potassium 3.5 - 5.1 mmol/L 4.4 4.0 3.8  Chloride 98 - 111 mmol/L 101 105 102  CO2 22 - 32 mmol/L _0 Calcium 8.9 - 10.3 mg/dL 9.3 9.0 9.3  Total Protein 6.5 - 8.1 g/dL 7.1 6.9 7.3  Total Bilirubin 0.3 - 1.2 mg/dL 0.2(L) 0.3 0.3  Alkaline Phos 38 - 126 U/L 53 45 48  AST 15 - 41 U/L 16 14(L) 14(L)  ALT 0 - 44 U/L _1 Lab Results  Component Value Date   WBC 11.5 (H) 11/08/2020   HGB 10.4 (L) 11/08/2020   HCT 29.9 (L) 11/08/2020    MCV 85.9 11/08/2020   PLT 288 11/08/2020   NEUTROABS 9.3 (H) 11/08/2020    ASSESSMENT & PLAN:  Malignant neoplasm of lower-outer quadrant of left breast of female, estrogen receptor positive ([place]) 10/01/2020:Screening mammogram showed a left breast mass and calcifications. Diagnostic mammogram and US showed a 1.7cm and 0.5cm mass at the 5 o'clock position in the left breast, with one 0.4cm abnormal right axillary lymph node. 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  Treatment Plan: 1. Neoadjuvant chemotherapy with TCH Perjeta 6 cycles followed by Herceptin Perjeta maintenance versus Kadcyla maintenance (based on response to neoadjuvant chemo) for 1 year 2. Followed by breast conserving surgery if possible with sentinel lymph node study 3. Followed by  adjuvant radiation therapy 4.Followed by antiestrogen therapy although ER is weak URCC nausea study Breast MRI: 10/17/20:2.4 cm left breast mass, intramammary lymph node (needs second look ultrasound and biopsy) ------------------------------------------------------------------------------------------------------------------------- Current treatment: Cycle 2TCHP Chemo Toxicities: 1.  Nausea: Intermittent in nature.  I discussed with her about taking preventative nausea medication from day 4 today 7 2. intermittent diarrhea once or twice per day: She took some Imodium and it has helped her. 3.  Dizziness: Improved 4.  Fatigue 5.  Chemotherapy-induced anemia: Today's hemoglobin is 10.4 we are monitoring.  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   RTC in 3 weeks for cycle 3    No orders of the defined types were placed in this encounter.  The patient has a good understanding of the overall plan. she agrees with it. she will call with any problems that may develop before the next visit here. Total time spent: 30 mins including face to face time and  time spent for planning, charting and co-ordination of care   Harriette Ohara, MD 11/08/20

## 2020-11-08 NOTE — Progress Notes (Signed)
Nutrition Follow-up:  Patient with left breast cancer. She is receiving neoadjuvant chemotherapy with TCH Perjeta.   Met with patient in infusion. She reports feeling a little tired today. She had 1 episode of vomiting after first treatment and a few days of nausea that was relieved by nausea medication. Her appetite has been good and is eating small frequent meals with snacks and drinking 1 Ensure daily and milk. Patient reports she has increased her water intake, drinking 3-4 (16 oz) bottles everyday. Over the past week patient has been eating eggs, oatmeal, rice/beans, yogurt, chicken, sandwiches, cheese crackers, pudding, carrots, tomatoes, lettuce. Patient has been walking 2-3 days/week for 30 minutes, she reports this has been calming for her and enjoys it.   Medications: reviewed  Labs: Glucose 132  Anthropometrics: Weight 159 lb 9.6 oz today increased from 156 lb on 5/13  163 lb - 5/6  NUTRITION DIAGNOSIS: Unintentional weight loss improved   INTERVENTION:  Continue high calorie, high protein meals and snacks for weight maintenance Continue drinking Ensure Plus daily for added calories and protein (350 kcal, 16 grams protein) Reviewed strategies for nausea, discussed taking nausea medication as prescribed and allowing 30 minutes prior to eating Discussed importance of hydration and adequate fluid intake Pt will increase supplement if meal intake declines Continue activity as able  MONITORING, EVALUATION, GOAL: eight trends, intake   NEXT VISIT: Friday June 17

## 2020-11-08 NOTE — Progress Notes (Signed)
Per [facility], ok to proceed with ordered dose of carboplatin.

## 2020-11-12 ENCOUNTER — Other Ambulatory Visit: Payer: Self-pay

## 2020-11-12 ENCOUNTER — Inpatient Hospital Stay: Payer: 59

## 2020-11-12 VITALS — BP 93/65 | HR 96 | Temp 98.0°F | Resp 18

## 2020-11-12 DIAGNOSIS — Z5111 Encounter for antineoplastic chemotherapy: Secondary | ICD-10-CM | POA: Diagnosis not present

## 2020-11-12 DIAGNOSIS — Z170 Estrogen receptor positive status [ER+]: Secondary | ICD-10-CM

## 2020-11-12 MED ORDER — PEGFILGRASTIM-CBQV 6 MG/0.6ML ~~LOC~~ SOSY
6.0000 mg | PREFILLED_SYRINGE | Freq: Once | SUBCUTANEOUS | Status: AC
  Administered 2020-11-12: 6 mg via SUBCUTANEOUS

## 2020-11-12 MED ORDER — PEGFILGRASTIM-CBQV 6 MG/0.6ML ~~LOC~~ SOSY
PREFILLED_SYRINGE | SUBCUTANEOUS | Status: AC
  Filled 2020-11-12: qty 0.6

## 2020-11-15 ENCOUNTER — Other Ambulatory Visit: Payer: 59

## 2020-11-15 ENCOUNTER — Ambulatory Visit: Payer: 59

## 2020-11-15 ENCOUNTER — Ambulatory Visit: Payer: 59 | Admitting: Hematology and Oncology

## 2020-11-18 ENCOUNTER — Ambulatory Visit: Payer: 59

## 2020-11-28 ENCOUNTER — Encounter: Payer: Self-pay | Admitting: *Deleted

## 2020-11-28 NOTE — Progress Notes (Signed)
Patient Care Team: [name], FNP as PCP - General (Family Medicine) Rockwell Germany, RN as Oncology Nurse Navigator Mauro Kaufmann, RN as Oncology Nurse Navigator Donnie Mesa, MD as Consulting Physician (General Surgery) Nicholas Lose, MD as Consulting Physician (Hematology and Oncology) Gery Pray, MD as Consulting Physician (Radiation Oncology)  DIAGNOSIS:    ICD-10-CM   1. Malignant neoplasm of lower-outer quadrant of left breast of female, estrogen receptor positive ([place])  C50.512    Z17.0       SUMMARY OF ONCOLOGIC HISTORY: Oncology History  Malignant neoplasm of lower-outer quadrant of left breast of female, estrogen receptor positive ([place])  10/01/2020 Initial Diagnosis   Screening mammogram showed a left breast mass and calcifications. Diagnostic mammogram and US showed a 1.7cm and 0.5cm mass at the 5 o'clock position in the left breast, with one 0.4cm abnormal right axillary lymph node. 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   10/09/2020 Cancer Staging   Staging form: Breast, AJCC 8th Edition - Clinical stage from 10/09/2020: Stage IB (cT2, cN1, cM0, G3, ER+, PR+, HER2+) - Signed by Nicholas Lose, MD on 10/09/2020  Stage prefix: Initial diagnosis  Histologic grading system: 3 grade system  Percentage of positive estrogen receptors (%): 5  Percentage of positive progesterone receptors (%): 1  Ki-67 (%): 20    10/18/2020 -  Chemotherapy    Patient is on Treatment Plan: BREAST  DOCETAXEL + CARBOPLATIN + TRASTUZUMAB + PERTUZUMAB  (TCHP) Q21D        10/23/2020 Genetic Testing   Negative genetic testing:  No pathogenic variants detected on the [employer] CancerNext-Expanded + RNAinsight panel. The report date is 10/23/2020.   The CancerNext-Expanded + RNAinsight gene panel offered by [employer] and includes sequencing and rearrangement analysis for the following 77 genes: AIP, ALK, APC,  ATM, AXIN2, BAP1, BARD1, BLM, BMPR1A, BRCA1, BRCA2, BRIP1, CDC73, CDH1, CDK4, CDKN1B, CDKN2A, CHEK2, CTNNA1, DICER1, FANCC, FH, FLCN, GALNT12, KIF1B, LZTR1, MAX, MEN1, MET, MLH1, MSH2, MSH3, MSH6, MUTYH, NBN, NF1, NF2, NTHL1, PALB2, PHOX2B, PMS2, POT1, PRKAR1A, PTCH1, PTEN, RAD51C, RAD51D, RB1, RECQL, RET, SDHA, SDHAF2, SDHB, SDHC, SDHD, SMAD4, SMARCA4, SMARCB1, SMARCE1, STK11, SUFU, TMEM127, TP53, TSC1, TSC2, VHL and XRCC2 (sequencing and deletion/duplication); EGFR, EGLN1, HOXB13, KIT, MITF, PDGFRA, POLD1 and POLE (sequencing only); EPCAM and GREM1 (deletion/duplication only). RNA data is routinely analyzed for use in variant interpretation for all genes.     CHIEF COMPLIANT: Cycle 3 TCH Perjeta  INTERVAL HISTORY: Tammie [name] is a 45 y.o. with above-mentioned history of left breast cancer who is currently on neoadjuvant chemotherapy with [place]. Today is cycle 3 of treatment.   ALLERGIES:  has No Known Allergies.  MEDICATIONS:  Current Outpatient Medications  Medication Sig Dispense Refill   Ascorbic Acid (VITAMIN C) 1000 MG tablet Take 1,000 mg by mouth daily.     Cholecalciferol (DIALYVITE VITAMIN D 5000) 125 MCG (5000 UT) capsule Take 5,000 Units by mouth daily.     citalopram (CELEXA) 20 MG tablet Take 20 mg by mouth daily.     dexamethasone (DECADRON) 4 MG tablet Take 1 tablet (4 mg total) by mouth 2 (two) times daily. Take 1 tablet day before chemo and 1 tablet day after chemo with food 12 tablet 0   ibuprofen (ADVIL) 800 MG tablet Take 800 mg by mouth 2 (two) times daily.     lidocaine-prilocaine (EMLA) cream Apply to affected area once  30 g 3   lisinopril (ZESTRIL) 20 MG tablet Take 1 tablet by mouth daily.     magnesium gluconate (MAGONATE) 500 MG tablet Take 500 mg by mouth 2 (two) times daily.     metoprolol tartrate (LOPRESSOR) 25 MG tablet Take 25 mg by mouth daily.     NON FORMULARY Take 900 mg by mouth daily. Hyaluronic Acid Complex     ondansetron (ZOFRAN) 8 MG  tablet Take 1 tablet (8 mg total) by mouth 2 (two) times daily as needed (Nausea or vomiting). Start on the third day after chemotherapy. 30 tablet 1   OVER THE COUNTER MEDICATION Take 4 capsules by mouth daily. Laminine supplement- 620 mg     prochlorperazine (COMPAZINE) 10 MG tablet Take 1 tablet (10 mg total) by mouth every 6 (six) hours as needed (Nausea or vomiting). 30 tablet 1   terbinafine (LAMISIL) 250 MG tablet Take 250 mg by mouth daily.     No current facility-administered medications for this visit.    PHYSICAL EXAMINATION: ECOG PERFORMANCE STATUS: 1 - Symptomatic but completely ambulatory  Vitals:   11/29/20 1002  BP: 104/62  Pulse: 78  Resp: 18  Temp: 97.6 F (36.4 C)  SpO2: 100%   Filed Weights   11/29/20 1002  Weight: 153 lb (69.4 kg)    LABORATORY DATA:  I have reviewed the data as listed CMP Latest Ref Rng & Units 11/08/2020 10/25/2020 10/18/2020  Glucose 70 - 99 mg/dL 132(H) 105(H) 136(H)  BUN 6 - 20 mg/dL 15 27(H) 11  Creatinine 0.44 - 1.00 mg/dL 0.67 0.95 0.76  Sodium 135 - 145 mmol/L 140 134(L) 140  Potassium 3.5 - 5.1 mmol/L 3.8 4.4 4.0  Chloride 98 - 111 mmol/L 104 101 105  CO2 22 - 32 mmol/L $RemoveB'25 23 24  'HNStMPAV$ Calcium 8.9 - 10.3 mg/dL 9.5 9.3 9.0  Total Protein 6.5 - 8.1 g/dL 6.6 7.1 6.9  Total Bilirubin 0.3 - 1.2 mg/dL <0.2(L) 0.2(L) 0.3  Alkaline Phos 38 - 126 U/L 53 53 45  AST 15 - 41 U/L 13(L) 16 14(L)  ALT 0 - 44 U/L $Remo'17 20 9    'CNJDa$ Lab Results  Component Value Date   WBC 11.8 (H) 11/29/2020   HGB 10.2 (L) 11/29/2020   HCT 30.0 (L) 11/29/2020   MCV 87.0 11/29/2020   PLT 197 11/29/2020   NEUTROABS 8.9 (H) 11/29/2020    ASSESSMENT & PLAN:  Malignant neoplasm of lower-outer quadrant of left breast of female, estrogen receptor positive ([place]) 10/01/2020:Screening mammogram showed a left breast mass and calcifications. Diagnostic mammogram and US showed a 1.7cm and 0.5cm mass at the 5 o'clock position in the left breast, with one 0.4cm abnormal right  axillary lymph node. 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   Treatment Plan: 1. Neoadjuvant chemotherapy with TCH Perjeta 6 cycles followed by Herceptin Perjeta maintenance versus Kadcyla maintenance (based on response to neoadjuvant chemo) for 1 year 2. Followed by breast conserving surgery if possible with sentinel lymph node study 3. Followed by adjuvant radiation therapy  4.  Followed by antiestrogen therapy although ER is weak URCC nausea study Breast MRI: 10/17/20: 2.4 cm left breast mass, intramammary lymph node (needs second look ultrasound and biopsy) ------------------------------------------------------------------------------------------------------------------------- Current treatment: Cycle 3 TCHP Chemo Toxicities: 1.  Nausea: With vomiting: I discussed with her about taking prophylactic Zofran every day for the first week after chemo 2. intermittent diarrhea once or  twice per day: She also take prophylactic Imodium every day for the first week after chemo 3.  Dizziness: Improved 4.  Fatigue 5.  Chemotherapy-induced anemia: Today's hemoglobin is 10.2 we are monitoring.  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     RTC in 3 weeks for cycle 4    No orders of the defined types were placed in this encounter.  The patient has a good understanding of the overall plan. she agrees with it. she will call with any problems that may develop before the next visit here.  Total time spent: 30 mins including face to face time and time spent for planning, charting and coordination of care  Rulon Eisenmenger, MD, MPH 11/29/2020  I, Thana Ates, am acting as scribe for Dr. Nicholas Lose.  I have reviewed the above documentation for accuracy and completeness, and I agree with the above.

## 2020-11-28 NOTE — Assessment & Plan Note (Signed)
10/01/2020:Screening mammogram showed a left breast mass and calcifications. Diagnostic mammogram and US showed a 1.7cm and 0.5cm mass at the 5 o'clock position in the left breast, with one 0.4cm abnormal right axillary lymph node. 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  Treatment Plan: 1. Neoadjuvant chemotherapy with TCH Perjeta 6 cycles followed by Herceptin Perjeta maintenance versus Kadcyla maintenance (based on response to neoadjuvant chemo) for 1 year 2. Followed by breast conserving surgery if possible with sentinel lymph node study 3. Followed by adjuvant radiation therapy 4.Followed by antiestrogen therapy although ER is weak URCC nausea study Breast MRI: 10/17/20:2.4 cm left breast mass, intramammary lymph node (needs second look ultrasound and biopsy) ------------------------------------------------------------------------------------------------------------------------- Current treatment: Cycle 3TCHP Chemo Toxicities: 1.Nausea: Intermittent in nature. I discussed with her about taking preventative nausea medication from day 4 today 7 2.intermittent diarrhea once or twice per day: She took some Imodium and it has helped her. 3.Dizziness: Improved 4.  Fatigue 5.  Chemotherapy-induced anemia: Today's hemoglobin is 10.4 we are monitoring.  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   RTC in 3 weeks for cycle 4

## 2020-11-29 ENCOUNTER — Inpatient Hospital Stay (HOSPITAL_BASED_OUTPATIENT_CLINIC_OR_DEPARTMENT_OTHER): Payer: 59 | Admitting: Hematology and Oncology

## 2020-11-29 ENCOUNTER — Other Ambulatory Visit: Payer: Self-pay

## 2020-11-29 ENCOUNTER — Inpatient Hospital Stay: Payer: 59 | Attending: Hematology and Oncology | Admitting: Dietician

## 2020-11-29 ENCOUNTER — Inpatient Hospital Stay: Payer: 59

## 2020-11-29 VITALS — BP 109/57 | HR 82 | Temp 97.7°F | Resp 18

## 2020-11-29 DIAGNOSIS — C50512 Malignant neoplasm of lower-outer quadrant of left female breast: Secondary | ICD-10-CM

## 2020-11-29 DIAGNOSIS — Z170 Estrogen receptor positive status [ER+]: Secondary | ICD-10-CM | POA: Insufficient documentation

## 2020-11-29 DIAGNOSIS — Z95828 Presence of other vascular implants and grafts: Secondary | ICD-10-CM

## 2020-11-29 DIAGNOSIS — Z5112 Encounter for antineoplastic immunotherapy: Secondary | ICD-10-CM | POA: Diagnosis not present

## 2020-11-29 DIAGNOSIS — Z79899 Other long term (current) drug therapy: Secondary | ICD-10-CM | POA: Insufficient documentation

## 2020-11-29 LAB — CMP (CANCER CENTER ONLY)
ALT: 22 U/L (ref 0–44)
AST: 22 U/L (ref 15–41)
Albumin: 4.2 g/dL (ref 3.5–5.0)
Alkaline Phosphatase: 51 U/L (ref 38–126)
Anion gap: 8 (ref 5–15)
BUN: 17 mg/dL (ref 6–20)
CO2: 27 mmol/L (ref 22–32)
Calcium: 9.7 mg/dL (ref 8.9–10.3)
Chloride: 102 mmol/L (ref 98–111)
Creatinine: 0.71 mg/dL (ref 0.44–1.00)
GFR, Estimated: >60 mL/min (ref >60)
Glucose, Bld: 120 mg/dL — ABNORMAL HIGH (ref 70–99)
Potassium: 3.9 mmol/L (ref 3.5–5.1)
Sodium: 137 mmol/L (ref 135–145)
Total Bilirubin: 0.6 mg/dL (ref 0.3–1.2)
Total Protein: 7 g/dL (ref 6.5–8.1)

## 2020-11-29 LAB — CBC WITH DIFFERENTIAL/PLATELET
Abs Immature Granulocytes: 0.05 10*3/uL (ref 0.00–0.07)
Basophils Absolute: 0 10*3/uL (ref 0.0–0.1)
Basophils Relative: 0 %
Eosinophils Absolute: 0 10*3/uL (ref 0.0–0.5)
Eosinophils Relative: 0 %
HCT: 30 % — ABNORMAL LOW (ref 36.0–46.0)
Hemoglobin: 10.2 g/dL — ABNORMAL LOW (ref 12.0–15.0)
Immature Granulocytes: 0 %
Lymphocytes Relative: 15 %
Lymphs Abs: 1.8 10*3/uL (ref 0.7–4.0)
MCH: 29.6 pg (ref 26.0–34.0)
MCHC: 34 g/dL (ref 30.0–36.0)
MCV: 87 fL (ref 80.0–100.0)
Monocytes Absolute: 1 10*3/uL (ref 0.1–1.0)
Monocytes Relative: 9 %
Neutro Abs: 8.9 10*3/uL — ABNORMAL HIGH (ref 1.7–7.7)
Neutrophils Relative %: 76 %
Platelets: 197 10*3/uL (ref 150–400)
RBC: 3.45 MIL/uL — ABNORMAL LOW (ref 3.87–5.11)
RDW: 15.9 % — ABNORMAL HIGH (ref 11.5–15.5)
WBC: 11.8 10*3/uL — ABNORMAL HIGH (ref 4.0–10.5)
nRBC: 0 % (ref 0.0–0.2)

## 2020-11-29 LAB — PREGNANCY, URINE: Preg Test, Ur: NEGATIVE

## 2020-11-29 MED ORDER — SODIUM CHLORIDE 0.9% FLUSH
10.0000 mL | INTRAVENOUS | Status: DC | PRN
  Administered 2020-11-29: 10 mL
  Filled 2020-11-29: qty 10

## 2020-11-29 MED ORDER — DIPHENHYDRAMINE HCL 25 MG PO CAPS
ORAL_CAPSULE | ORAL | Status: AC
  Filled 2020-11-29: qty 2

## 2020-11-29 MED ORDER — B COMPLEX-C PO TABS: 1.0000 | ORAL_TABLET | Freq: Every day | ORAL | Status: DC

## 2020-11-29 MED ORDER — ACETAMINOPHEN 325 MG PO TABS
ORAL_TABLET | ORAL | Status: AC
  Filled 2020-11-29: qty 2

## 2020-11-29 MED ORDER — SODIUM CHLORIDE 0.9 % IV SOLN
700.0000 mg | Freq: Once | INTRAVENOUS | Status: AC
  Administered 2020-11-29: 700 mg via INTRAVENOUS
  Filled 2020-11-29: qty 70

## 2020-11-29 MED ORDER — SODIUM CHLORIDE 0.9 % IV SOLN
420.0000 mg | Freq: Once | INTRAVENOUS | Status: AC
  Administered 2020-11-29: 420 mg via INTRAVENOUS
  Filled 2020-11-29: qty 14

## 2020-11-29 MED ORDER — SODIUM CHLORIDE 0.9 % IV SOLN
150.0000 mg | Freq: Once | INTRAVENOUS | Status: AC
  Administered 2020-11-29: 150 mg via INTRAVENOUS
  Filled 2020-11-29: qty 150

## 2020-11-29 MED ORDER — HEPARIN SOD (PORK) LOCK FLUSH 100 UNIT/ML IV SOLN
500.0000 [IU] | Freq: Once | INTRAVENOUS | Status: AC | PRN
  Administered 2020-11-29: 500 [IU]
  Filled 2020-11-29: qty 5

## 2020-11-29 MED ORDER — DIPHENHYDRAMINE HCL 25 MG PO CAPS
50.0000 mg | ORAL_CAPSULE | Freq: Once | ORAL | Status: AC
Start: 2020-11-29 — End: 2020-11-29
  Administered 2020-11-29: 50 mg via ORAL

## 2020-11-29 MED ORDER — COLD PACK MISC ONCOLOGY
1.0000 | Freq: Once | Status: DC | PRN
  Filled 2020-11-29: qty 1

## 2020-11-29 MED ORDER — SODIUM CHLORIDE 0.9% FLUSH
10.0000 mL | INTRAVENOUS | Status: AC | PRN
  Administered 2020-11-29: 10 mL
  Filled 2020-11-29: qty 10

## 2020-11-29 MED ORDER — DEXAMETHASONE SODIUM PHOSPHATE 100 MG/10ML IJ SOLN
10.0000 mg | Freq: Once | INTRAMUSCULAR | Status: AC
  Administered 2020-11-29: 10 mg via INTRAVENOUS
  Filled 2020-11-29: qty 10

## 2020-11-29 MED ORDER — TRASTUZUMAB-ANNS CHEMO 150 MG IV SOLR
6.0000 mg/kg | Freq: Once | INTRAVENOUS | Status: AC
  Administered 2020-11-29: 441 mg via INTRAVENOUS
  Filled 2020-11-29: qty 21

## 2020-11-29 MED ORDER — SODIUM CHLORIDE 0.9 % IV SOLN
Freq: Once | INTRAVENOUS | Status: AC
  Filled 2020-11-29: qty 250

## 2020-11-29 MED ORDER — ACETAMINOPHEN 325 MG PO TABS
650.0000 mg | ORAL_TABLET | Freq: Once | ORAL | Status: AC
  Administered 2020-11-29: 650 mg via ORAL

## 2020-11-29 MED ORDER — PALONOSETRON HCL INJECTION 0.25 MG/5ML
0.2500 mg | Freq: Once | INTRAVENOUS | Status: AC
Start: 2020-11-29 — End: 2020-11-29
  Administered 2020-11-29: 0.25 mg via INTRAVENOUS

## 2020-11-29 MED ORDER — PALONOSETRON HCL INJECTION 0.25 MG/5ML
INTRAVENOUS | Status: AC
  Filled 2020-11-29: qty 5

## 2020-11-29 MED ORDER — SODIUM CHLORIDE 0.9 % IV SOLN
75.0000 mg/m2 | Freq: Once | INTRAVENOUS | Status: AC
  Administered 2020-11-29: 140 mg via INTRAVENOUS
  Filled 2020-11-29: qty 14

## 2020-11-29 NOTE — Patient Instructions (Signed)
[facility] MEDICAL ONCOLOGY  Discharge Instructions: Thank you for choosing [facility] to provide your oncology and hematology care.   If you have a lab appointment with the Cancer Center, please go directly to the Cancer Center and check in at the registration area.   Wear comfortable clothing and clothing appropriate for easy access to any Portacath or PICC line.   We strive to give you quality time with your provider. You may need to reschedule your appointment if you arrive late (15 or more minutes).  Arriving late affects you and other patients whose appointments are after yours.  Also, if you miss three or more appointments without notifying the office, you may be dismissed from the clinic at the provider's discretion.      For prescription refill requests, have your pharmacy contact our office and allow 72 hours for refills to be completed.    Today you received the following chemotherapy and/or immunotherapy agents: Trastuzumab, Pertuzumab (Perjeta), Docetaxel (Taxotere), and Carboplatin.   To help prevent nausea and vomiting after your treatment, we encourage you to take your nausea medication as directed.  BELOW ARE SYMPTOMS THAT SHOULD BE REPORTED IMMEDIATELY: *FEVER GREATER THAN 100.4 F (38 C) OR HIGHER *CHILLS OR SWEATING *NAUSEA AND VOMITING THAT IS NOT CONTROLLED WITH YOUR NAUSEA MEDICATION *UNUSUAL SHORTNESS OF BREATH *UNUSUAL BRUISING OR BLEEDING *URINARY PROBLEMS (pain or burning when urinating, or frequent urination) *BOWEL PROBLEMS (unusual diarrhea, constipation, pain near the anus) TENDERNESS IN MOUTH AND THROAT WITH OR WITHOUT PRESENCE OF ULCERS (sore throat, sores in mouth, or a toothache) UNUSUAL RASH, SWELLING OR PAIN  UNUSUAL VAGINAL DISCHARGE OR ITCHING   Items with * indicate a potential emergency and should be followed up as soon as possible or go to the Emergency Department if any problems should occur.  Please show the  CHEMOTHERAPY ALERT CARD or IMMUNOTHERAPY ALERT CARD at check-in to the Emergency Department and triage nurse.  Should you have questions after your visit or need to cancel or reschedule your appointment, please contact [facility]  Dept: [phone]  and follow the prompts.  Office hours are 8:00 a.m. to 4:30 p.m. Monday - Friday. Please note that voicemails left after 4:00 p.m. may not be returned until the following business day.  We are closed weekends and major holidays. You have access to a nurse at all times for urgent questions. Please call the main number to the clinic Dept: [phone] and follow the prompts.   For any non-urgent questions, you may also contact your provider using MyChart. We now offer e-Visits for anyone 18 and older to request care online for non-urgent symptoms. For details visit [email or web address].   Also download the MyChart app! Go to the app store, search "MyChart", open the app, select [facility], and log in with your MyChart username and password.  Due to Covid, a mask is required upon entering the hospital/clinic. If you do not have a mask, one will be given to you upon arrival. For doctor visits, patients may have 1 support person aged 18 or older with them. For treatment visits, patients cannot have anyone with them due to current Covid guidelines and our immunocompromised population.   

## 2020-11-29 NOTE — Progress Notes (Signed)
Nutrition Follow-up:  Patient with left breast cancer. She is receiving neoadjuvant chemotherapy with TCH Perjeta.   Met with patient in infusion. Patient reports having nausea with vomiting and diarrhea lasting for ~1 week after receiving treatment. She reports eating small amounts and unable to tolerate Ensure for that week. Patient reports symptoms resolve, has appetite and eats "really good" for the following 2 weeks. Patient reports drinking a lot of water and using Liquid IV, says this has really helped her to stay hydrated. Patient instructed by MD to take nausea medication and Imodium daily for the first 5 days after treatment.    Medications: B-complex with C, Decadron, Compazine, Zofran  Labs: Glucose 120  Anthropometrics: Weight 153 lb today decreased 6 lbs (3.8%) in 3 weeks, not significant but concerning  5/27 - 159 lb 9.6 oz 5/13 - 156 lb 5/6 - 163 lb    NUTRITION DIAGNOSIS: Unintentional weight loss ongoing   INTERVENTION:  Reviewed strategies for nausea, pt has handout Encouraged high protein foods with small frequent meals/snacks  Discussed taking nausea medication as MD instructed and waiting 30 minutes after taking before eating Discussed strategies for improved supplement tolerance Encouraged drinking Ensure Plus/equivalent 2-3 times/day with decreased meal intake Patient reports having enough Ensure left from complimentary case Coupons given Patient has contact information     MONITORING, EVALUATION, GOAL: weight trends, intake   NEXT VISIT: Friday, July 8 in infusion

## 2020-12-02 ENCOUNTER — Other Ambulatory Visit: Payer: Self-pay

## 2020-12-02 ENCOUNTER — Inpatient Hospital Stay: Payer: 59

## 2020-12-02 VITALS — BP 115/59 | HR 94 | Temp 98.4°F | Resp 18

## 2020-12-02 DIAGNOSIS — Z170 Estrogen receptor positive status [ER+]: Secondary | ICD-10-CM

## 2020-12-02 DIAGNOSIS — Z5112 Encounter for antineoplastic immunotherapy: Secondary | ICD-10-CM | POA: Diagnosis not present

## 2020-12-02 MED ORDER — PEGFILGRASTIM-CBQV 6 MG/0.6ML ~~LOC~~ SOSY
PREFILLED_SYRINGE | SUBCUTANEOUS | Status: AC
  Filled 2020-12-02: qty 0.6

## 2020-12-02 MED ORDER — PEGFILGRASTIM-CBQV 6 MG/0.6ML ~~LOC~~ SOSY
6.0000 mg | PREFILLED_SYRINGE | Freq: Once | SUBCUTANEOUS | Status: AC
  Administered 2020-12-02: 6 mg via SUBCUTANEOUS

## 2020-12-02 NOTE — Patient Instructions (Signed)

## 2020-12-06 ENCOUNTER — Ambulatory Visit: Payer: 59

## 2020-12-06 ENCOUNTER — Other Ambulatory Visit: Payer: 59

## 2020-12-06 ENCOUNTER — Ambulatory Visit: Payer: 59 | Admitting: Hematology and Oncology

## 2020-12-09 ENCOUNTER — Ambulatory Visit: Payer: 59

## 2020-12-11 ENCOUNTER — Encounter: Payer: Self-pay | Admitting: Licensed Clinical Social Worker

## 2020-12-11 NOTE — Progress Notes (Signed)
[facility] CSW Progress Note  Patient dropped off applications for assistance through breast cancer foundations.  CSW submitted [employer] application today. Other applications ([employer] [place], [place]) were missing supporting documents. CSW provided list of documents needed to patient who will bring them to her next appt.     Christeen Douglas , LCSW

## 2020-12-19 NOTE — Progress Notes (Signed)
Patient Care Team: [name], FNP as PCP - General (Family Medicine) Rockwell Germany, RN as Oncology Nurse Navigator Mauro Kaufmann, RN as Oncology Nurse Navigator Donnie Mesa, MD as Consulting Physician (General Surgery) Nicholas Lose, MD as Consulting Physician (Hematology and Oncology) Gery Pray, MD as Consulting Physician (Radiation Oncology)  DIAGNOSIS:    ICD-10-CM   1. Malignant neoplasm of lower-outer quadrant of left breast of female, estrogen receptor positive ([place])  C50.512    Z17.0       SUMMARY OF ONCOLOGIC HISTORY: Oncology History  Malignant neoplasm of lower-outer quadrant of left breast of female, estrogen receptor positive ([place])  10/01/2020 Initial Diagnosis   Screening mammogram showed a left breast mass and calcifications. Diagnostic mammogram and US showed a 1.7cm and 0.5cm mass at the 5 o'clock position in the left breast, with one 0.4cm abnormal right axillary lymph node. 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   10/09/2020 Cancer Staging   Staging form: Breast, AJCC 8th Edition - Clinical stage from 10/09/2020: Stage IB (cT2, cN1, cM0, G3, ER+, PR+, HER2+) - Signed by Nicholas Lose, MD on 10/09/2020  Stage prefix: Initial diagnosis  Histologic grading system: 3 grade system  Percentage of positive estrogen receptors (%): 5  Percentage of positive progesterone receptors (%): 1  Ki-67 (%): 20    10/18/2020 -  Chemotherapy    Patient is on Treatment Plan: BREAST  DOCETAXEL + CARBOPLATIN + TRASTUZUMAB + PERTUZUMAB  (TCHP) Q21D        10/23/2020 Genetic Testing   Negative genetic testing:  No pathogenic variants detected on the [facility] CancerNext-Expanded + RNAinsight panel. The report date is 10/23/2020.   The CancerNext-Expanded + RNAinsight gene panel offered by [employer] and includes sequencing and rearrangement analysis for the following 77 genes: AIP, ALK, APC,  ATM, AXIN2, BAP1, BARD1, BLM, BMPR1A, BRCA1, BRCA2, BRIP1, CDC73, CDH1, CDK4, CDKN1B, CDKN2A, CHEK2, CTNNA1, DICER1, FANCC, FH, FLCN, GALNT12, KIF1B, LZTR1, MAX, MEN1, MET, MLH1, MSH2, MSH3, MSH6, MUTYH, NBN, NF1, NF2, NTHL1, PALB2, PHOX2B, PMS2, POT1, PRKAR1A, PTCH1, PTEN, RAD51C, RAD51D, RB1, RECQL, RET, SDHA, SDHAF2, SDHB, SDHC, SDHD, SMAD4, SMARCA4, SMARCB1, SMARCE1, STK11, SUFU, TMEM127, TP53, TSC1, TSC2, VHL and XRCC2 (sequencing and deletion/duplication); EGFR, EGLN1, HOXB13, KIT, MITF, PDGFRA, POLD1 and POLE (sequencing only); EPCAM and GREM1 (deletion/duplication only). RNA data is routinely analyzed for use in variant interpretation for all genes.     CHIEF COMPLIANT: Cycle 4 TCH Perjeta  INTERVAL HISTORY: Tammie [name] is a 45 y.o. with above-mentioned history of left breast cancer who is currently on neoadjuvant chemotherapy with Pope. She presents to the clinic today for cycle 4.  She is tolerated chemo fairly well.  She has felt really well after the last cycle of chemo.  She did not have any nausea or vomiting.  She does have mild fatigue.  The diarrhea is completely well controlled because she is taking Imodium every morning.  ALLERGIES:  has No Known Allergies.  MEDICATIONS:  Current Outpatient Medications  Medication Sig Dispense Refill   B Complex-C (B-COMPLEX WITH VITAMIN C) tablet Take 1 tablet by mouth daily.     Cholecalciferol (DIALYVITE VITAMIN D 5000) 125 MCG (5000 UT) capsule Take 5,000 Units by mouth daily.     citalopram (CELEXA) 20 MG tablet Take 20 mg by mouth daily.     dexamethasone (DECADRON) 4 MG tablet Take 1 tablet (4 mg total) by  mouth 2 (two) times daily. Take 1 tablet day before chemo and 1 tablet day after chemo with food 12 tablet 0   ibuprofen (ADVIL) 800 MG tablet Take 800 mg by mouth 2 (two) times daily.     lidocaine-prilocaine (EMLA) cream Apply to affected area once 30 g 3   lisinopril (ZESTRIL) 20 MG tablet Take 1 tablet by mouth daily.      metoprolol tartrate (LOPRESSOR) 25 MG tablet Take 25 mg by mouth daily.     NON FORMULARY Take 900 mg by mouth daily. Hyaluronic Acid Complex     ondansetron (ZOFRAN) 8 MG tablet Take 1 tablet (8 mg total) by mouth 2 (two) times daily as needed (Nausea or vomiting). Start on the third day after chemotherapy. 30 tablet 1   OVER THE COUNTER MEDICATION Take 4 capsules by mouth daily. Laminine supplement- 620 mg     prochlorperazine (COMPAZINE) 10 MG tablet Take 1 tablet (10 mg total) by mouth every 6 (six) hours as needed (Nausea or vomiting). 30 tablet 1   terbinafine (LAMISIL) 250 MG tablet Take 250 mg by mouth daily.     No current facility-administered medications for this visit.    PHYSICAL EXAMINATION: ECOG PERFORMANCE STATUS: 1 - Symptomatic but completely ambulatory  Vitals:   12/20/20 0837  BP: 118/63  Pulse: 81  Resp: 18  Temp: 97.6 F (36.4 C)  SpO2: 100%   Filed Weights   12/20/20 0837  Weight: 152 lb 6.4 oz (69.1 kg)    LABORATORY DATA:  I have reviewed the data as listed CMP Latest Ref Rng & Units 11/29/2020 11/08/2020 10/25/2020  Glucose 70 - 99 mg/dL 120(H) 132(H) 105(H)  BUN 6 - 20 mg/dL 17 15 27(H)  Creatinine 0.44 - 1.00 mg/dL 0.71 0.67 0.95  Sodium 135 - 145 mmol/L 137 140 134(L)  Potassium 3.5 - 5.1 mmol/L 3.9 3.8 4.4  Chloride 98 - 111 mmol/L 102 104 101  CO2 22 - 32 mmol/L _0 Calcium 8.9 - 10.3 mg/dL 9.7 9.5 9.3  Total Protein 6.5 - 8.1 g/dL 7.0 6.6 7.1  Total Bilirubin 0.3 - 1.2 mg/dL 0.6 <0.2(L) 0.2(L)  Alkaline Phos 38 - 126 U/L 51 53 53  AST 15 - 41 U/L 22 13(L) 16  ALT 0 - 44 U/L _1 Lab Results  Component Value Date   WBC 12.3 (H) 12/20/2020   HGB 9.7 (L) 12/20/2020   HCT 27.9 (L) 12/20/2020   MCV 89.1 12/20/2020   PLT 202 12/20/2020   NEUTROABS 9.2 (H) 12/20/2020    ASSESSMENT & PLAN:  Malignant neoplasm of lower-outer quadrant of left breast of female, estrogen receptor positive ([place]) 10/01/2020:Screening mammogram  showed a left breast mass and calcifications. Diagnostic mammogram and US showed a 1.7cm and 0.5cm mass at the 5 o'clock position in the left breast, with one 0.4cm abnormal right axillary lymph node. 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   Treatment Plan: 1. Neoadjuvant chemotherapy with TCH Perjeta 6 cycles followed by Herceptin Perjeta maintenance versus Kadcyla maintenance (based on response to neoadjuvant chemo) for 1 year 2. Followed by breast conserving surgery if possible with sentinel lymph node study 3. Followed by adjuvant radiation therapy  4.  Followed by antiestrogen therapy although ER is weak URCC nausea study Breast MRI: 10/17/20: 2.4 cm left breast mass, intramammary lymph node (needs second look ultrasound and biopsy) -------------------------------------------------------------------------------------------------------------------------  Current treatment: Cycle 4 TCHP Chemo Toxicities: 1.  Nausea: Resolved 2. intermittent diarrhea once or twice per day: She also take prophylactic Imodium every day for the first week after chemo.  This is working very well for her. 3.  Dizziness: Improved 4.  Fatigue: Mild to moderate 5.  Chemotherapy-induced anemia: Today's hemoglobin is 9.8 we are monitoring.  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   Denies peripheral neuropathy. RTC in 3 weeks for cycle 5    No orders of the defined types were placed in this encounter.  The patient has a good understanding of the overall plan. she agrees with it. she will call with any problems that may develop before the next visit here.  Total time spent: 30 mins including face to face time and time spent for planning, charting and coordination of care  Rulon Eisenmenger, MD, MPH 12/20/2020  I, Thana Ates, am acting as scribe for Dr. Nicholas Lose.  I have reviewed the above documentation for accuracy and  completeness, and I agree with the above.

## 2020-12-20 ENCOUNTER — Inpatient Hospital Stay (HOSPITAL_BASED_OUTPATIENT_CLINIC_OR_DEPARTMENT_OTHER): Payer: 59 | Admitting: Hematology and Oncology

## 2020-12-20 ENCOUNTER — Inpatient Hospital Stay: Payer: 59

## 2020-12-20 ENCOUNTER — Inpatient Hospital Stay: Payer: 59 | Admitting: Dietician

## 2020-12-20 ENCOUNTER — Other Ambulatory Visit: Payer: Self-pay

## 2020-12-20 ENCOUNTER — Encounter: Payer: Self-pay | Admitting: Licensed Clinical Social Worker

## 2020-12-20 ENCOUNTER — Inpatient Hospital Stay: Payer: 59 | Attending: Hematology and Oncology

## 2020-12-20 DIAGNOSIS — Z170 Estrogen receptor positive status [ER+]: Secondary | ICD-10-CM

## 2020-12-20 DIAGNOSIS — Z1379 Encounter for other screening for genetic and chromosomal anomalies: Secondary | ICD-10-CM

## 2020-12-20 DIAGNOSIS — Z5111 Encounter for antineoplastic chemotherapy: Secondary | ICD-10-CM | POA: Diagnosis present

## 2020-12-20 DIAGNOSIS — Z79899 Other long term (current) drug therapy: Secondary | ICD-10-CM | POA: Diagnosis not present

## 2020-12-20 DIAGNOSIS — C50512 Malignant neoplasm of lower-outer quadrant of left female breast: Secondary | ICD-10-CM

## 2020-12-20 DIAGNOSIS — Z95828 Presence of other vascular implants and grafts: Secondary | ICD-10-CM | POA: Insufficient documentation

## 2020-12-20 LAB — CBC WITH DIFFERENTIAL/PLATELET
Abs Immature Granulocytes: 0.06 10*3/uL (ref 0.00–0.07)
Basophils Absolute: 0 10*3/uL (ref 0.0–0.1)
Basophils Relative: 0 %
Eosinophils Absolute: 0 10*3/uL (ref 0.0–0.5)
Eosinophils Relative: 0 %
HCT: 27.9 % — ABNORMAL LOW (ref 36.0–46.0)
Hemoglobin: 9.7 g/dL — ABNORMAL LOW (ref 12.0–15.0)
Immature Granulocytes: 1 %
Lymphocytes Relative: 15 %
Lymphs Abs: 1.9 10*3/uL (ref 0.7–4.0)
MCH: 31 pg (ref 26.0–34.0)
MCHC: 34.8 g/dL (ref 30.0–36.0)
MCV: 89.1 fL (ref 80.0–100.0)
Monocytes Absolute: 1.1 10*3/uL — ABNORMAL HIGH (ref 0.1–1.0)
Monocytes Relative: 9 %
Neutro Abs: 9.2 10*3/uL — ABNORMAL HIGH (ref 1.7–7.7)
Neutrophils Relative %: 75 %
Platelets: 202 10*3/uL (ref 150–400)
RBC: 3.13 MIL/uL — ABNORMAL LOW (ref 3.87–5.11)
RDW: 18.5 % — ABNORMAL HIGH (ref 11.5–15.5)
WBC: 12.3 10*3/uL — ABNORMAL HIGH (ref 4.0–10.5)
nRBC: 0 % (ref 0.0–0.2)

## 2020-12-20 LAB — COMPREHENSIVE METABOLIC PANEL
ALT: 14 U/L (ref 0–44)
AST: 19 U/L (ref 15–41)
Albumin: 4 g/dL (ref 3.5–5.0)
Alkaline Phosphatase: 50 U/L (ref 38–126)
Anion gap: 10 (ref 5–15)
BUN: 11 mg/dL (ref 6–20)
CO2: 25 mmol/L (ref 22–32)
Calcium: 9.7 mg/dL (ref 8.9–10.3)
Chloride: 104 mmol/L (ref 98–111)
Creatinine, Ser: 0.66 mg/dL (ref 0.44–1.00)
GFR, Estimated: >60 mL/min (ref >60)
Glucose, Bld: 117 mg/dL — ABNORMAL HIGH (ref 70–99)
Potassium: 3.8 mmol/L (ref 3.5–5.1)
Sodium: 139 mmol/L (ref 135–145)
Total Bilirubin: 0.3 mg/dL (ref 0.3–1.2)
Total Protein: 7 g/dL (ref 6.5–8.1)

## 2020-12-20 LAB — PREGNANCY, URINE: Preg Test, Ur: NEGATIVE

## 2020-12-20 MED ORDER — SODIUM CHLORIDE 0.9 % IV SOLN
700.0000 mg | Freq: Once | INTRAVENOUS | Status: AC
Start: 2020-12-20 — End: 2020-12-20
  Administered 2020-12-20: 700 mg via INTRAVENOUS
  Filled 2020-12-20: qty 70

## 2020-12-20 MED ORDER — DEXAMETHASONE 4 MG PO TABS: 4.0000 mg | ORAL_TABLET | Freq: Two times a day (BID) | ORAL | 0 refills | Status: DC

## 2020-12-20 MED ORDER — TRASTUZUMAB-ANNS CHEMO 150 MG IV SOLR
6.0000 mg/kg | Freq: Once | INTRAVENOUS | Status: AC
  Administered 2020-12-20: 441 mg via INTRAVENOUS
  Filled 2020-12-20: qty 21

## 2020-12-20 MED ORDER — ACETAMINOPHEN 325 MG PO TABS
ORAL_TABLET | ORAL | Status: AC
  Filled 2020-12-20: qty 2

## 2020-12-20 MED ORDER — PALONOSETRON HCL INJECTION 0.25 MG/5ML
INTRAVENOUS | Status: AC
  Filled 2020-12-20: qty 5

## 2020-12-20 MED ORDER — SODIUM CHLORIDE 0.9 % IV SOLN
10.0000 mg | Freq: Once | INTRAVENOUS | Status: AC
  Administered 2020-12-20: 10 mg via INTRAVENOUS
  Filled 2020-12-20: qty 10

## 2020-12-20 MED ORDER — PALONOSETRON HCL INJECTION 0.25 MG/5ML
0.2500 mg | Freq: Once | INTRAVENOUS | Status: AC
  Administered 2020-12-20: 0.25 mg via INTRAVENOUS

## 2020-12-20 MED ORDER — DIPHENHYDRAMINE HCL 25 MG PO CAPS
50.0000 mg | ORAL_CAPSULE | Freq: Once | ORAL | Status: AC
  Administered 2020-12-20: 50 mg via ORAL

## 2020-12-20 MED ORDER — ACETAMINOPHEN 325 MG PO TABS
650.0000 mg | ORAL_TABLET | Freq: Once | ORAL | Status: AC
  Administered 2020-12-20: 650 mg via ORAL

## 2020-12-20 MED ORDER — HEPARIN SOD (PORK) LOCK FLUSH 100 UNIT/ML IV SOLN
500.0000 [IU] | Freq: Once | INTRAVENOUS | Status: AC | PRN
Start: 2020-12-20 — End: 2020-12-20
  Administered 2020-12-20: 500 [IU]
  Filled 2020-12-20: qty 5

## 2020-12-20 MED ORDER — SODIUM CHLORIDE 0.9 % IV SOLN
Freq: Once | INTRAVENOUS | Status: AC
  Filled 2020-12-20: qty 250

## 2020-12-20 MED ORDER — SODIUM CHLORIDE 0.9% FLUSH
10.0000 mL | INTRAVENOUS | Status: DC | PRN
  Administered 2020-12-20: 10 mL
  Filled 2020-12-20: qty 10

## 2020-12-20 MED ORDER — SODIUM CHLORIDE 0.9% FLUSH
10.0000 mL | INTRAVENOUS | Status: AC | PRN
Start: 2020-12-20 — End: 2020-12-20
  Administered 2020-12-20: 10 mL
  Filled 2020-12-20: qty 10

## 2020-12-20 MED ORDER — SODIUM CHLORIDE 0.9 % IV SOLN
420.0000 mg | Freq: Once | INTRAVENOUS | Status: AC
  Administered 2020-12-20: 420 mg via INTRAVENOUS
  Filled 2020-12-20: qty 14

## 2020-12-20 MED ORDER — SODIUM CHLORIDE 0.9 % IV SOLN
75.0000 mg/m2 | Freq: Once | INTRAVENOUS | Status: AC
  Administered 2020-12-20: 140 mg via INTRAVENOUS
  Filled 2020-12-20: qty 14

## 2020-12-20 MED ORDER — DIPHENHYDRAMINE HCL 25 MG PO CAPS
ORAL_CAPSULE | ORAL | Status: AC
  Filled 2020-12-20: qty 2

## 2020-12-20 MED ORDER — SODIUM CHLORIDE 0.9 % IV SOLN
150.0000 mg | Freq: Once | INTRAVENOUS | Status: AC
  Administered 2020-12-20: 150 mg via INTRAVENOUS
  Filled 2020-12-20: qty 150

## 2020-12-20 NOTE — Assessment & Plan Note (Signed)
10/01/2020:Screening mammogram showed a left breast mass and calcifications. Diagnostic mammogram and US showed a 1.7cm and 0.5cm mass at the 5 o'clock position in the left breast, with one 0.4cm abnormal right axillary lymph node. 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  Treatment Plan: 1. Neoadjuvant chemotherapy with TCH Perjeta 6 cycles followed by Herceptin Perjeta maintenance versus Kadcyla maintenance (based on response to neoadjuvant chemo) for 1 year 2. Followed by breast conserving surgery if possible with sentinel lymph node study 3. Followed by adjuvant radiation therapy 4.Followed by antiestrogen therapy although ER is weak URCC nausea study Breast MRI: 10/17/20:2.4 cm left breast mass, intramammary lymph node (needs second look ultrasound and biopsy) ------------------------------------------------------------------------------------------------------------------------- Current treatment: Cycle4TCHP Chemo Toxicities: 1.Nausea: With vomiting: I discussed with her about taking prophylactic Zofran every day for the first week after chemo 2.intermittent diarrhea once or twice per day: She also take prophylactic Imodium every day for the first week after chemo 3.Dizziness:Improved 4.Fatigue 5.Chemotherapy-induced anemia: Today's hemoglobin is 10.2 we are monitoring. She is a Jehov[employer]'s Witness and does not receive blood.   RTC in3 weeks for cycle 5 

## 2020-12-20 NOTE — Patient Instructions (Signed)
[facility]ICAL [facility]COL[facility]  Discharge Instructions: Thank you for choosing [facility] to provide your oncology and hematology care.   If you have a lab appointment with the Cancer Center, please go directly to the Cancer Center and check in at the registration area.   Wear comfortable clothing and clothing appropriate for easy access to any Portacath or PICC line.   We strive to give you quality time with your provider. You may need to reschedule your appointment if you arrive late (15 or more minutes).  Arriving late affects you and other patients whose appointments are after yours.  Also, if you miss three or more appointments without notifying the office, you may be dismissed from the clinic at the provider's discretion.      For prescription refill requests, have your pharmacy contact our office and allow 72 hours for refills to be completed.    Today you received the following chemotherapy and/or immunotherapy agents: Trastuzumab, Pertuzumab (Perjeta), Docetaxel (Taxotere), and Carboplatin.   To help prevent nausea and vomiting after your treatment, we encourage you to take your nausea medication as directed.  BELOW ARE SYMPTOMS THAT SHOULD BE REPORTED IMMEDIATELY: *FEVER GREATER THAN 100.4 F (38 C) OR HIGHER *CHILLS OR SWEATING *NAUSEA AND VOMITING THAT IS NOT CONTROLLED WITH YOUR NAUSEA MEDICATION *UNUSUAL SHORTNESS OF BREATH *UNUSUAL BRUISING OR BLEEDING *URINARY PROBLEMS (pain or burning when urinating, or frequent urination) *BOWEL PROBLEMS (unusual diarrhea, constipation, pain near the anus) TENDERNESS IN MOUTH AND THROAT WITH OR WITHOUT PRESENCE OF ULCERS (sore throat, sores in mouth, or a toothache) UNUSUAL RASH, SWELLING OR PAIN  UNUSUAL VAGINAL DISCHARGE OR ITCHING   Items with * indicate a potential emergency and should be followed up as soon as possible or go to the Emergency Department if any problems should occur.  Please show the  CHEMOTHERAPY ALERT CARD or IMMUNOTHERAPY ALERT CARD at check-in to the Emergency Department and triage nurse.  Should you have questions after your visit or need to cancel or reschedule your appointment, please contact [facility]  Dept: [phone]  and follow the prompts.  Office hours are 8:00 a.m. to 4:30 p.m. Monday - Friday. Please note that voicemails left after 4:00 p.m. may not be returned until the following business day.  We are closed weekends and major holidays. You have access to a nurse at all times for urgent questions. Please call the main number to the clinic Dept: [phone] and follow the prompts.   For any non-urgent questions, you may also contact your provider using MyChart. We now offer e-Visits for anyone 18 and older to request care online for non-urgent symptoms. For details visit [email or web address].   Also download the MyChart app! Go to the app store, search "MyChart", open the app, select [facility], and log in with your MyChart username and password.  Due to Covid, a mask is required upon entering the hospital/clinic. If you do not have a mask, one will be given to you upon arrival. For doctor visits, patients may have 1 support person aged 18 or older with them. For treatment visits, patients cannot have anyone with them due to current Covid guidelines and our immunocompromised population.   

## 2020-12-20 NOTE — Progress Notes (Signed)
[facility] CSW Progress Note  Holiday representative met with patient in infusion to follow-up on applications for assistance. Pt returned needed paperwork and CSW submitted finished applications for [employer], [employer] [place], and [employer]. Each agency will inform patient of decisions regarding assistance.    Christeen Douglas , LCSW

## 2020-12-20 NOTE — Progress Notes (Signed)
Per Dr. Lindi Adie, ok to hang up Perjeta after docetaxel.

## 2020-12-20 NOTE — Progress Notes (Signed)
Nutrition Follow-up:  Patient with breast cancer. She is receiving neoadjuvant chemotherapy with TCH Perjeta.   Met with patient in infusion. She reports doing doing well. Patient reports nausea has resolved, she is taking nausea medication in the morning daily for 1 week after treatment. Patient reports a couple episodes of diarrhea the week after treatment that is relieved by Imodium. Patient reports decrease in appetite after treatment, but she continues to eat. Yesterday she had scrambled egg and sausage for breakfast, rice, shrimp with sauce for lunch, sausage, rice and piece of peach cake for dinner. Patient says she is drinking a lot of water. She continues to use liquid IV once daily. Patient drinks Ensure occasionally.    Medications: reviewed  Labs: Glucose 117  Anthropometrics: Weight 152 lb 6.4 oz today stable. Patient weighed 153 lb on 6/17  5/27 - 159 lb 9.6 oz 5/13 - 156 lb    NUTRITION DIAGNOSIS: Unintentional weight loss stable   INTERVENTION:  Continue eating high calorie, high protein foods for weight maintenance Continue taking nausea medication  Continue taking Imodium for diarrhea as needed Encouraged to drink Ensure supplement as needed with decreased oral intake  Patient reports she still has a coupon and does not need additional coupons at this time Patient has contact information     MONITORING, EVALUATION, GOAL: weight trends, intake   NEXT VISIT: To be scheduled as needed

## 2020-12-20 NOTE — Addendum Note (Signed)
Addended by: Gardiner Rhyme on: 12/20/2020 01:24 PM   Modules accepted: Orders

## 2020-12-23 ENCOUNTER — Other Ambulatory Visit: Payer: Self-pay

## 2020-12-23 ENCOUNTER — Inpatient Hospital Stay: Payer: 59

## 2020-12-23 VITALS — BP 97/77 | HR 96 | Temp 98.4°F | Resp 16

## 2020-12-23 DIAGNOSIS — Z170 Estrogen receptor positive status [ER+]: Secondary | ICD-10-CM

## 2020-12-23 DIAGNOSIS — C50512 Malignant neoplasm of lower-outer quadrant of left female breast: Secondary | ICD-10-CM

## 2020-12-23 DIAGNOSIS — Z5111 Encounter for antineoplastic chemotherapy: Secondary | ICD-10-CM | POA: Diagnosis not present

## 2020-12-23 MED ORDER — PEGFILGRASTIM-CBQV 6 MG/0.6ML ~~LOC~~ SOSY
6.0000 mg | PREFILLED_SYRINGE | Freq: Once | SUBCUTANEOUS | Status: AC
  Administered 2020-12-23: 6 mg via SUBCUTANEOUS

## 2020-12-23 MED ORDER — PEGFILGRASTIM-CBQV 6 MG/0.6ML ~~LOC~~ SOSY
PREFILLED_SYRINGE | SUBCUTANEOUS | Status: AC
  Filled 2020-12-23: qty 0.6

## 2020-12-23 NOTE — Patient Instructions (Signed)

## 2020-12-27 ENCOUNTER — Ambulatory Visit: Payer: 59 | Admitting: Hematology and Oncology

## 2020-12-27 ENCOUNTER — Ambulatory Visit: Payer: 59

## 2020-12-27 ENCOUNTER — Other Ambulatory Visit: Payer: 59

## 2020-12-30 ENCOUNTER — Ambulatory Visit: Payer: 59

## 2021-01-15 NOTE — Progress Notes (Signed)
[facility] Follow up:    Tammie Hickman, FNP [place] [address]   DIAGNOSIS: Cancer Staging Malignant neoplasm of lower-outer quadrant of left breast of female, estrogen receptor positive ([place]) Staging form: Breast, AJCC 8th Edition - Clinical stage from 10/09/2020: Stage IB (cT2, cN1, cM0, G3, ER+, PR+, HER2+) - Signed by Nicholas Lose, MD on 10/09/2020 Stage prefix: Initial diagnosis Histologic grading system: 3 grade system Percentage of positive estrogen receptors (%): 5 Percentage of positive progesterone receptors (%): 1 Ki-67 (%): 20   SUMMARY OF ONCOLOGIC HISTORY: Oncology History  Malignant neoplasm of lower-outer quadrant of left breast of female, estrogen receptor positive ([place])  10/01/2020 Initial Diagnosis   Screening mammogram showed a left breast mass and calcifications. Diagnostic mammogram and US showed a 1.7cm and 0.5cm mass at the 5 o'clock position in the left breast, with one 0.4cm abnormal right axillary lymph node. 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   10/09/2020 Cancer Staging   Staging form: Breast, AJCC 8th Edition - Clinical stage from 10/09/2020: Stage IB (cT2, cN1, cM0, G3, ER+, PR+, HER2+) - Signed by Nicholas Lose, MD on 10/09/2020  Stage prefix: Initial diagnosis  Histologic grading system: 3 grade system  Percentage of positive estrogen receptors (%): 5  Percentage of positive progesterone receptors (%): 1  Ki-67 (%): 20    10/18/2020 -  Chemotherapy    Patient is on Treatment Plan: BREAST  DOCETAXEL + CARBOPLATIN + TRASTUZUMAB + PERTUZUMAB  (TCHP) Q21D        10/23/2020 Genetic Testing   Negative genetic testing:  No pathogenic variants detected on the [facility] CancerNext-Expanded + RNAinsight panel. The report date is 10/23/2020.   The CancerNext-Expanded + RNAinsight gene panel offered by [employer] and includes sequencing  and rearrangement analysis for the following 77 genes: AIP, ALK, APC, ATM, AXIN2, BAP1, BARD1, BLM, BMPR1A, BRCA1, BRCA2, BRIP1, CDC73, CDH1, CDK4, CDKN1B, CDKN2A, CHEK2, CTNNA1, DICER1, FANCC, FH, FLCN, GALNT12, KIF1B, LZTR1, MAX, MEN1, MET, MLH1, MSH2, MSH3, MSH6, MUTYH, NBN, NF1, NF2, NTHL1, PALB2, PHOX2B, PMS2, POT1, PRKAR1A, PTCH1, PTEN, RAD51C, RAD51D, RB1, RECQL, RET, SDHA, SDHAF2, SDHB, SDHC, SDHD, SMAD4, SMARCA4, SMARCB1, SMARCE1, STK11, SUFU, TMEM127, TP53, TSC1, TSC2, VHL and XRCC2 (sequencing and deletion/duplication); EGFR, EGLN1, HOXB13, KIT, MITF, PDGFRA, POLD1 and POLE (sequencing only); EPCAM and GREM1 (deletion/duplication only). RNA data is routinely analyzed for use in variant interpretation for all genes.     CURRENT THERAPY: TCHP cycle 5  INTERVAL HISTORY: Tammie [name] 45 y.o. female returns for evaluation prior to receiving her fifth cycle of neoadjuvant chemotherapy with TCHP.  Her most recent echo was completed on 10/16/2020 and was normal.  A repeat echo was ordered for 01/20/2021 and is scheduled for 01/21/2021.  Overall she is feeling well.  She has some nausea the week after treatment with occasional vomiting in the morning.  She also has difficulty sleeping the night after treatment and says she used to take Klonipin that would help her sleep and wonders if there is anything like that I can send in for her.  She is mildly fatigued.  She denies peripheral neuropathy.  Otherwise she is feeling well and without questions or concerns.    Patient Active Problem List   Diagnosis Date Noted   Port-A-Cath in place 12/20/2020   Genetic testing 10/23/2020   Family history of breast cancer    Family  history of prostate cancer    Family history of stomach cancer    Family history of leukemia    Malignant neoplasm of lower-outer quadrant of left breast of female, estrogen receptor positive ([place]) 10/03/2020    has No Known Allergies.  MEDICAL HISTORY: Past Medical History:   Diagnosis Date   Anxiety    Arrhythmia    palpitation   Depression    Family history of breast cancer    Family history of leukemia    Family history of prostate cancer    Family history of stomach cancer    Fibroid, uterine    Fibromyalgia    Hx of degenerative disc disease    Hypertension    Interstitial cystitis    Microadenoma    Migraines    PONV (postoperative nausea and vomiting)    Pre-diabetes     SURGICAL HISTORY: Past Surgical History:  Procedure Laterality Date   CHOLECYSTECTOMY     PORTACATH PLACEMENT Right 10/17/2020   Procedure: INSERTION PORT-A-CATH, ULTRASOUND GUIDED;  Surgeon: Donnie Mesa, MD;  Location: [facility];  Service: General;  Laterality: Right;    SOCIAL HISTORY: Social History   Socioeconomic History   Marital status: Married    Spouse name: Not on file   Number of children: Not on file   Years of education: Not on file   Highest education level: Not on file  Occupational History   Not on file  Tobacco Use   Smoking status: Never   Smokeless tobacco: Never  Vaping Use   Vaping Use: Never used  Substance and Sexual Activity   Alcohol use: Never   Drug use: Never   Sexual activity: Not on file  Other Topics Concern   Not on file  Social History Narrative   Not on file   Social Determinants of Health   Financial Resource Strain: Not on file  Food Insecurity: Not on file  Transportation Needs: Not on file  Physical Activity: Not on file  Stress: Not on file  Social Connections: Not on file  Intimate Partner Violence: Not on file    FAMILY HISTORY: Family History  Problem Relation Age of Onset   Breast cancer Mother 41   Prostate cancer Father 79   Stomach cancer Paternal Grandmother        dx older than 15   Leukemia Paternal Grandfather 70       dx older than 28   Breast cancer Paternal Aunt    Breast cancer Other        mother's first cousin    Review of Systems  Constitutional:  Positive for  fatigue. Negative for appetite change, chills, fever and unexpected weight change.  HENT:   Negative for hearing loss, lump/mass, mouth sores and trouble swallowing.   Eyes:  Negative for eye problems and icterus.  Respiratory:  Negative for chest tightness, cough and shortness of breath.   Cardiovascular:  Negative for chest pain, leg swelling and palpitations.  Gastrointestinal:  Positive for nausea and vomiting. Negative for abdominal distention, abdominal pain, constipation and diarrhea.  Endocrine: Negative for hot flashes.  Genitourinary:  Negative for difficulty urinating.   Musculoskeletal:  Negative for arthralgias.  Skin:  Negative for itching and rash.  Neurological:  Negative for dizziness, extremity weakness, headaches and numbness.  Hematological:  Negative for adenopathy. Does not bruise/bleed easily.  Psychiatric/Behavioral:  Negative for depression. The patient is not nervous/anxious.      PHYSICAL EXAMINATION  ECOG PERFORMANCE STATUS:  1 - Symptomatic but completely ambulatory  Vitals:   01/17/21 1003  BP: 126/68  Pulse: 94  Resp: 18  Temp: (!) 97.2 F (36.2 C)  SpO2: 100%    Physical Exam Constitutional:      General: She is not in acute distress.    Appearance: Normal appearance. She is not toxic-appearing.  HENT:     Head: Normocephalic and atraumatic.  Eyes:     General: No scleral icterus. Cardiovascular:     Rate and Rhythm: Normal rate and regular rhythm.     Pulses: Normal pulses.     Heart sounds: Normal heart sounds.  Pulmonary:     Effort: Pulmonary effort is normal.     Breath sounds: Normal breath sounds.  Abdominal:     General: Abdomen is flat. Bowel sounds are normal. There is no distension.     Palpations: Abdomen is soft.     Tenderness: There is no abdominal tenderness.  Musculoskeletal:        General: No swelling.     Cervical back: Neck supple.  Lymphadenopathy:     Cervical: No cervical adenopathy.  Skin:    General: Skin  is warm and dry.     Findings: No rash.  Neurological:     General: No focal deficit present.     Mental Status: She is alert.  Psychiatric:        Mood and Affect: Mood normal.        Behavior: Behavior normal.    LABORATORY DATA:  CBC    Component Value Date/Time   WBC 10.2 01/17/2021 0916   RBC 3.20 (L) 01/17/2021 0916   HGB 10.2 (L) 01/17/2021 0916   HGB 12.1 10/09/2020 1241   HCT 30.1 (L) 01/17/2021 0916   PLT 296 01/17/2021 0916   PLT 224 10/09/2020 1241   MCV 94.1 01/17/2021 0916   MCH 31.9 01/17/2021 0916   MCHC 33.9 01/17/2021 0916   RDW 18.3 (H) 01/17/2021 0916   LYMPHSABS 1.8 01/17/2021 0916   MONOABS 1.0 01/17/2021 0916   EOSABS 0.0 01/17/2021 0916   BASOSABS 0.0 01/17/2021 0916    CMP     Component Value Date/Time   NA 137 01/17/2021 0916   K 4.0 01/17/2021 0916   CL 103 01/17/2021 0916   CO2 25 01/17/2021 0916   GLUCOSE 112 (H) 01/17/2021 0916   BUN 15 01/17/2021 0916   CREATININE 0.70 01/17/2021 0916   CREATININE 0.71 11/29/2020 0940   CALCIUM 10.2 01/17/2021 0916   PROT 7.0 01/17/2021 0916   ALBUMIN 4.2 01/17/2021 0916   AST 14 (L) 01/17/2021 0916   AST 22 11/29/2020 0940   ALT 12 01/17/2021 0916   ALT 22 11/29/2020 0940   ALKPHOS 43 01/17/2021 0916   BILITOT 0.3 01/17/2021 0916   BILITOT 0.6 11/29/2020 0940   GFRNONAA >60 01/17/2021 0916   GFRNONAA >60 11/29/2020 0940       ASSESSMENT and THERAPY PLAN:   Malignant neoplasm of lower-outer quadrant of left breast of female, estrogen receptor positive ([place]) 10/01/2020:Screening mammogram showed a left breast mass and calcifications. Diagnostic mammogram and US showed a 1.7cm and 0.5cm mass at the 5 o'clock position in the left breast, with one 0.4cm abnormal right axillary lymph node. 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   Treatment Plan: 1. Neoadjuvant chemotherapy with TCH Perjeta 6 cycles followed by  Herceptin Perjeta maintenance versus Kadcyla maintenance (  based on response to neoadjuvant chemo) for 1 year 2. Followed by breast conserving surgery if possible with sentinel lymph node study 3. Followed by adjuvant radiation therapy  4.  Followed by antiestrogen therapy although ER is weak URCC nausea study Breast MRI: 10/17/20: 2.4 cm left breast mass, intramammary lymph node (needs second look ultrasound and biopsy) ------------------------------------------------------------------------------------------------------------------------- Current treatment: Cycle 5 TCHP Chemo Toxicities: 1.  Nausea: Managed with antiementics 2. Diarrhea: managed with imdoium 3. Insomnia: likely due to steroids.  I reviewed PMP aware, no red flags, wrote for #10 of Lorazepam 0.20m QHS prn.   4.  Fatigue 5.  Chemotherapy-induced anemia: Today's hemoglobin is 10.2 we are monitoring.  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  I placed orders for her post treatment MRI.  We will see her back in 3 weeks for her final chemotherapy.       RTC in 3 weeks for cycle 6    All questions were answered. The patient knows to call the clinic with any problems, questions or concerns. We can certainly see the patient much sooner if necessary.  Total encounter time: 26 minutes* in face to face visit time, chart review, lab review, order entry, care coordination, and documentation of the encounter.    LWilber Bihari NP 01/17/21 1:01 PM [facility] [id number]. [phone]   Fax. [phone] *Total Encounter Time as defined by the Centers for Medicare and Medicaid Services includes, in addition to the face-to-face time of a patient visit (documented in the note above) non-face-to-face time: obtaining and reviewing outside history, ordering and reviewing medications, tests or procedures, care coordination (communications with  other health care professionals or caregivers) and documentation in the medical record.

## 2021-01-15 NOTE — Assessment & Plan Note (Addendum)
10/01/2020:Screening mammogram showed a left breast mass and calcifications. Diagnostic mammogram and US showed a 1.7cm and 0.5cm mass at the 5 o'clock position in the left breast, with one 0.4cm abnormal right axillary lymph node. 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  Treatment Plan: 1. Neoadjuvant chemotherapy with TCH Perjeta 6 cycles followed by Herceptin Perjeta maintenance versus Kadcyla maintenance (based on response to neoadjuvant chemo) for 1 year 2. Followed by breast conserving surgery if possible with sentinel lymph node study 3. Followed by adjuvant radiation therapy 4.Followed by antiestrogen therapy although ER is weak URCC nausea study Breast MRI: 10/17/20:2.4 cm left breast mass, intramammary lymph node (needs second look ultrasound and biopsy) ------------------------------------------------------------------------------------------------------------------------- Current treatment: Cycle5TCHP Chemo Toxicities: 1.Nausea: Managed with antiementics 2.Diarrhea: managed with imdoium 3.Insomnia: likely due to steroids.  I reviewed PMP aware, no red flags, wrote for #10 of Lorazepam 0.$RemoveBefore'5mg'[id number]$  QHS prn.   4.Fatigue 5.Chemotherapy-induced anemia: Today's hemoglobin is 10.2 we are monitoring. 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  I placed orders for her post treatment MRI.  We will see her back in 3 weeks for her final chemotherapy.     RTC in3 weeks for cycle 6

## 2021-01-16 ENCOUNTER — Other Ambulatory Visit: Payer: Self-pay | Admitting: *Deleted

## 2021-01-17 ENCOUNTER — Other Ambulatory Visit: Payer: 59

## 2021-01-17 ENCOUNTER — Other Ambulatory Visit: Payer: Self-pay

## 2021-01-17 ENCOUNTER — Inpatient Hospital Stay: Payer: 59

## 2021-01-17 ENCOUNTER — Inpatient Hospital Stay: Payer: 59 | Attending: Hematology and Oncology | Admitting: Adult Health

## 2021-01-17 ENCOUNTER — Ambulatory Visit: Payer: 59

## 2021-01-17 ENCOUNTER — Encounter: Payer: Self-pay | Admitting: *Deleted

## 2021-01-17 ENCOUNTER — Ambulatory Visit: Payer: 59 | Admitting: Hematology and Oncology

## 2021-01-17 VITALS — BP 126/68 | HR 94 | Temp 97.2°F | Resp 18 | Wt 149.2 lb

## 2021-01-17 DIAGNOSIS — Z95828 Presence of other vascular implants and grafts: Secondary | ICD-10-CM

## 2021-01-17 DIAGNOSIS — Z79899 Other long term (current) drug therapy: Secondary | ICD-10-CM | POA: Diagnosis not present

## 2021-01-17 DIAGNOSIS — C50512 Malignant neoplasm of lower-outer quadrant of left female breast: Secondary | ICD-10-CM | POA: Diagnosis present

## 2021-01-17 DIAGNOSIS — Z170 Estrogen receptor positive status [ER+]: Secondary | ICD-10-CM | POA: Diagnosis not present

## 2021-01-17 DIAGNOSIS — Z5111 Encounter for antineoplastic chemotherapy: Secondary | ICD-10-CM | POA: Diagnosis not present

## 2021-01-17 LAB — PREGNANCY, URINE
Preg Test, Ur: NEGATIVE — AB
Preg Test, Ur: NEGATIVE — AB
Preg Test, Ur: NEGATIVE

## 2021-01-17 LAB — COMPREHENSIVE METABOLIC PANEL
ALT: 12 U/L (ref 0–44)
AST: 14 U/L — ABNORMAL LOW (ref 15–41)
Albumin: 4.2 g/dL (ref 3.5–5.0)
Alkaline Phosphatase: 43 U/L (ref 38–126)
Anion gap: 9 (ref 5–15)
BUN: 15 mg/dL (ref 6–20)
CO2: 25 mmol/L (ref 22–32)
Calcium: 10.2 mg/dL (ref 8.9–10.3)
Chloride: 103 mmol/L (ref 98–111)
Creatinine, Ser: 0.7 mg/dL (ref 0.44–1.00)
GFR, Estimated: >60 mL/min (ref >60)
Glucose, Bld: 112 mg/dL — ABNORMAL HIGH (ref 70–99)
Potassium: 4 mmol/L (ref 3.5–5.1)
Sodium: 137 mmol/L (ref 135–145)
Total Bilirubin: 0.3 mg/dL (ref 0.3–1.2)
Total Protein: 7 g/dL (ref 6.5–8.1)

## 2021-01-17 LAB — CBC WITH DIFFERENTIAL/PLATELET
Abs Immature Granulocytes: 0.04 10*3/uL (ref 0.00–0.07)
Basophils Absolute: 0 10*3/uL (ref 0.0–0.1)
Basophils Relative: 0 %
Eosinophils Absolute: 0 10*3/uL (ref 0.0–0.5)
Eosinophils Relative: 0 %
HCT: 30.1 % — ABNORMAL LOW (ref 36.0–46.0)
Hemoglobin: 10.2 g/dL — ABNORMAL LOW (ref 12.0–15.0)
Immature Granulocytes: 0 %
Lymphocytes Relative: 18 %
Lymphs Abs: 1.8 10*3/uL (ref 0.7–4.0)
MCH: 31.9 pg (ref 26.0–34.0)
MCHC: 33.9 g/dL (ref 30.0–36.0)
MCV: 94.1 fL (ref 80.0–100.0)
Monocytes Absolute: 1 10*3/uL (ref 0.1–1.0)
Monocytes Relative: 10 %
Neutro Abs: 7.3 10*3/uL (ref 1.7–7.7)
Neutrophils Relative %: 72 %
Platelets: 296 10*3/uL (ref 150–400)
RBC: 3.2 MIL/uL — ABNORMAL LOW (ref 3.87–5.11)
RDW: 18.3 % — ABNORMAL HIGH (ref 11.5–15.5)
WBC: 10.2 10*3/uL (ref 4.0–10.5)
nRBC: 0 % (ref 0.0–0.2)

## 2021-01-17 MED ORDER — ACETAMINOPHEN 325 MG PO TABS
650.0000 mg | ORAL_TABLET | Freq: Once | ORAL | Status: AC
  Administered 2021-01-17: 650 mg via ORAL

## 2021-01-17 MED ORDER — SODIUM CHLORIDE 0.9 % IV SOLN
420.0000 mg | Freq: Once | INTRAVENOUS | Status: AC
  Administered 2021-01-17: 420 mg via INTRAVENOUS
  Filled 2021-01-17: qty 14

## 2021-01-17 MED ORDER — SODIUM CHLORIDE 0.9% FLUSH
10.0000 mL | Freq: Once | INTRAVENOUS | Status: AC
  Administered 2021-01-17: 10 mL
  Filled 2021-01-17: qty 10

## 2021-01-17 MED ORDER — LORAZEPAM 0.5 MG PO TABS: 0.5000 mg | ORAL_TABLET | Freq: Every evening | ORAL | 0 refills | Status: DC | PRN

## 2021-01-17 MED ORDER — SODIUM CHLORIDE 0.9 % IV SOLN
75.0000 mg/m2 | Freq: Once | INTRAVENOUS | Status: AC
  Administered 2021-01-17: 140 mg via INTRAVENOUS
  Filled 2021-01-17: qty 14

## 2021-01-17 MED ORDER — PROCHLORPERAZINE MALEATE 10 MG PO TABS: 10.0000 mg | ORAL_TABLET | Freq: Four times a day (QID) | ORAL | 1 refills | Status: DC | PRN

## 2021-01-17 MED ORDER — DIPHENHYDRAMINE HCL 25 MG PO CAPS
ORAL_CAPSULE | ORAL | Status: AC
  Filled 2021-01-17: qty 2

## 2021-01-17 MED ORDER — DIPHENHYDRAMINE HCL 25 MG PO CAPS
50.0000 mg | ORAL_CAPSULE | Freq: Once | ORAL | Status: AC
  Administered 2021-01-17: 50 mg via ORAL

## 2021-01-17 MED ORDER — ACETAMINOPHEN 325 MG PO TABS
ORAL_TABLET | ORAL | Status: AC
  Filled 2021-01-17: qty 2

## 2021-01-17 MED ORDER — SODIUM CHLORIDE 0.9 % IV SOLN
10.0000 mg | Freq: Once | INTRAVENOUS | Status: AC
  Administered 2021-01-17: 10 mg via INTRAVENOUS
  Filled 2021-01-17: qty 10

## 2021-01-17 MED ORDER — TRASTUZUMAB-ANNS CHEMO 150 MG IV SOLR
6.0000 mg/kg | Freq: Once | INTRAVENOUS | Status: AC
  Administered 2021-01-17: 441 mg via INTRAVENOUS
  Filled 2021-01-17: qty 21

## 2021-01-17 MED ORDER — SODIUM CHLORIDE 0.9 % IV SOLN
Freq: Once | INTRAVENOUS | Status: AC
  Filled 2021-01-17: qty 250

## 2021-01-17 MED ORDER — PALONOSETRON HCL INJECTION 0.25 MG/5ML
0.2500 mg | Freq: Once | INTRAVENOUS | Status: AC
Start: 2021-01-17 — End: 2021-01-17
  Administered 2021-01-17: 0.25 mg via INTRAVENOUS

## 2021-01-17 MED ORDER — PALONOSETRON HCL INJECTION 0.25 MG/5ML
INTRAVENOUS | Status: AC
  Filled 2021-01-17: qty 5

## 2021-01-17 MED ORDER — SODIUM CHLORIDE 0.9 % IV SOLN
150.0000 mg | Freq: Once | INTRAVENOUS | Status: AC
  Administered 2021-01-17: 150 mg via INTRAVENOUS
  Filled 2021-01-17: qty 150

## 2021-01-17 MED ORDER — SODIUM CHLORIDE 0.9 % IV SOLN
700.0000 mg | Freq: Once | INTRAVENOUS | Status: AC
  Administered 2021-01-17: 700 mg via INTRAVENOUS
  Filled 2021-01-17: qty 70

## 2021-01-17 MED ORDER — SODIUM CHLORIDE 0.9% FLUSH
10.0000 mL | INTRAVENOUS | Status: DC | PRN
  Administered 2021-01-17: 10 mL
  Filled 2021-01-17: qty 10

## 2021-01-17 MED ORDER — HEPARIN SOD (PORK) LOCK FLUSH 100 UNIT/ML IV SOLN
500.0000 [IU] | Freq: Once | INTRAVENOUS | Status: AC | PRN
  Administered 2021-01-17: 500 [IU]
  Filled 2021-01-17: qty 5

## 2021-01-17 NOTE — Patient Instructions (Signed)
[name] ONCOL[facility]  Discharge Instructions: Thank you for choosing [facility] to provide your oncology and hematology care.   If you have a lab appointment with the [facility], please go directly to the [facility] and check in at the registration area.   Wear comfortable clothing and clothing appropriate for easy access to any Portacath or PICC line.   We strive to give you quality time with your provider. You may need to reschedule your appointment if you arrive late (15 or more minutes).  Arriving late affects you and other patients whose appointments are after yours.  Also, if you miss three or more appointments without notifying the office, you may be dismissed from the clinic at the provider's discretion.      For prescription refill requests, have your pharmacy contact our office and allow 72 hours for refills to be completed.    Today you received the following chemotherapy and/or immunotherapy agents Kanjinti, Perjeta, Taxotere, & Carboplatin      To help prevent nausea and vomiting after your treatment, we encourage you to take your nausea medication as directed.  BELOW ARE SYMPTOMS THAT SHOULD BE REPORTED IMMEDIATELY: *FEVER GREATER THAN 100.4 F (38 C) OR HIGHER *CHILLS OR SWEATING *NAUSEA AND VOMITING THAT IS NOT CONTROLLED WITH YOUR NAUSEA MEDICATION *UNUSUAL SHORTNESS OF BREATH *UNUSUAL BRUISING OR BLEEDING *URINARY PROBLEMS (pain or burning when urinating, or frequent urination) *BOWEL PROBLEMS (unusual diarrhea, constipation, pain near the anus) TENDERNESS IN MOUTH AND THROAT WITH OR WITHOUT PRESENCE OF ULCERS (sore throat, sores in mouth, or a toothache) UNUSUAL RASH, SWELLING OR PAIN  UNUSUAL VAGINAL DISCHARGE OR ITCHING   Items with * indicate a potential emergency and should be followed up as soon as possible or go to the Emergency Department if any problems should occur.  Please show the CHEMOTHERAPY ALERT CARD or  IMMUNOTHERAPY ALERT CARD at check-in to the Emergency Department and triage nurse.  Should you have questions after your visit or need to cancel or reschedule your appointment, please contact [facility]  Dept: [phone]  and follow the prompts.  Office hours are 8:00 a.m. to 4:30 p.m. Monday - Friday. Please note that voicemails left after 4:00 p.m. may not be returned until the following business day.  We are closed weekends and major holidays. You have access to a nurse at all times for urgent questions. Please call the main number to the clinic Dept: [phone] and follow the prompts.   For any non-urgent questions, you may also contact your provider using MyChart. We now offer e-Visits for anyone 45 and older to request care online for non-urgent symptoms. For details visit [email or web address].   Also download the MyChart app! Go to the app store, search "MyChart", open the app, select [facility], and log in with your MyChart username and password.  Due to Covid, a mask is required upon entering the hospital/clinic. If you do not have a mask, one will be given to you upon arrival. For doctor visits, patients may have 1 support person aged 19 or older with them. For treatment visits, patients cannot have anyone with them due to current Covid guidelines and our immunocompromised population.

## 2021-01-20 ENCOUNTER — Telehealth: Payer: Self-pay | Admitting: Hematology and Oncology

## 2021-01-20 ENCOUNTER — Other Ambulatory Visit: Payer: Self-pay

## 2021-01-20 ENCOUNTER — Inpatient Hospital Stay: Payer: 59

## 2021-01-20 VITALS — BP 103/54 | HR 80 | Resp 16

## 2021-01-20 DIAGNOSIS — C50512 Malignant neoplasm of lower-outer quadrant of left female breast: Secondary | ICD-10-CM

## 2021-01-20 DIAGNOSIS — Z5111 Encounter for antineoplastic chemotherapy: Secondary | ICD-10-CM | POA: Diagnosis not present

## 2021-01-20 DIAGNOSIS — Z170 Estrogen receptor positive status [ER+]: Secondary | ICD-10-CM

## 2021-01-20 MED ORDER — PEGFILGRASTIM-CBQV 6 MG/0.6ML ~~LOC~~ SOSY
PREFILLED_SYRINGE | SUBCUTANEOUS | Status: AC
  Filled 2021-01-20: qty 0.6

## 2021-01-20 MED ORDER — PEGFILGRASTIM-CBQV 6 MG/0.6ML ~~LOC~~ SOSY
6.0000 mg | PREFILLED_SYRINGE | Freq: Once | SUBCUTANEOUS | Status: AC
  Administered 2021-01-20: 6 mg via SUBCUTANEOUS

## 2021-01-20 NOTE — Telephone Encounter (Signed)
Spoke to patient to confirm upcoming MRI appointment, patient understood where to go  and time to arrive

## 2021-01-20 NOTE — Patient Instructions (Signed)

## 2021-01-21 ENCOUNTER — Ambulatory Visit (HOSPITAL_COMMUNITY)
Admission: RE | Admit: 2021-01-21 | Discharge: 2021-01-21 | Disposition: A | Discharge status: Home / Self Care | Payer: 59 | Source: Ambulatory Visit | Attending: Hematology and Oncology | Admitting: Hematology and Oncology

## 2021-01-21 DIAGNOSIS — Z01818 Encounter for other preprocedural examination: Secondary | ICD-10-CM | POA: Insufficient documentation

## 2021-01-21 DIAGNOSIS — I10 Essential (primary) hypertension: Secondary | ICD-10-CM | POA: Insufficient documentation

## 2021-01-21 DIAGNOSIS — Z170 Estrogen receptor positive status [ER+]: Secondary | ICD-10-CM

## 2021-01-21 DIAGNOSIS — Z0189 Encounter for other specified special examinations: Secondary | ICD-10-CM | POA: Diagnosis not present

## 2021-01-21 DIAGNOSIS — C50512 Malignant neoplasm of lower-outer quadrant of left female breast: Secondary | ICD-10-CM

## 2021-01-21 LAB — ECHOCARDIOGRAM COMPLETE
Area-P 1/2: 3.27 cm2
S' Lateral: 2.8 cm

## 2021-01-21 NOTE — Progress Notes (Signed)
  Echocardiogram 2D Echocardiogram has been performed.  Fidel Levy 01/21/2021, 9:47 AM

## 2021-02-06 NOTE — Assessment & Plan Note (Addendum)
10/01/2020:Screening mammogram showed a left breast mass and calcifications. Diagnostic mammogram and US showed a 1.7cm and 0.5cm mass at the 5 o'clock position in the left breast, with one 0.4cm abnormal right axillary lymph node. 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  Treatment Plan: 1. Neoadjuvant chemotherapy with TCH Perjeta 6 cycles followed by Herceptin Perjeta maintenance versus Kadcyla maintenance (based on response to neoadjuvant chemo) for 1 year 2. Followed by breast conserving surgery if possible with sentinel lymph node study 3. Followed by adjuvant radiation therapy 4.Followed by antiestrogen therapy although ER is weak URCC nausea study Breast MRI: 10/17/20:2.4 cm left breast mass, intramammary lymph node (needs second look ultrasound and biopsy) ------------------------------------------------------------------------------------------------------------------------- Current treatment: Cycle6TCHP Chemo Toxicities: 1.Nausea:Managed with antiementics 2.Diarrhea: managed with imdoium 3.Insomnia: likely due to steroids.  I reviewed PMP aware, no red flags, wrote for #10 of Lorazepam 0.39m QHS prn.   4.Fatigue 5.Chemotherapy-induced anemia: Today's hemoglobin is 10.2we are monitoring. 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  Breast MRI scheduled for 02/10/2021    RTC in3 weeks for Herceptin and Perjeta maintenance.

## 2021-02-06 NOTE — Progress Notes (Signed)
Patient Care Team: [name], FNP as PCP - General (Family Medicine) Rockwell Germany, RN as Oncology Nurse Navigator Mauro Kaufmann, RN as Oncology Nurse Navigator Donnie Mesa, MD as Consulting Physician (General Surgery) Nicholas Lose, MD as Consulting Physician (Hematology and Oncology) Gery Pray, MD as Consulting Physician (Radiation Oncology)  DIAGNOSIS:    ICD-10-CM   1. Malignant neoplasm of lower-outer quadrant of left breast of female, estrogen receptor positive ([place])  C50.512    Z17.0       SUMMARY OF ONCOLOGIC HISTORY: Oncology History  Malignant neoplasm of lower-outer quadrant of left breast of female, estrogen receptor positive ([place])  10/01/2020 Initial Diagnosis   Screening mammogram showed a left breast mass and calcifications. Diagnostic mammogram and US showed a 1.7cm and 0.5cm mass at the 5 o'clock position in the left breast, with one 0.4cm abnormal right axillary lymph node. 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   10/09/2020 Cancer Staging   Staging form: Breast, AJCC 8th Edition - Clinical stage from 10/09/2020: Stage IB (cT2, cN1, cM0, G3, ER+, PR+, HER2+) - Signed by Nicholas Lose, MD on 10/09/2020 Stage prefix: Initial diagnosis Histologic grading system: 3 grade system Percentage of positive estrogen receptors (%): 5 Percentage of positive progesterone receptors (%): 1 Ki-67 (%): 20   10/18/2020 -  Chemotherapy    Patient is on Treatment Plan: BREAST  DOCETAXEL + CARBOPLATIN + TRASTUZUMAB + PERTUZUMAB  (TCHP) Q21D        10/23/2020 Genetic Testing   Negative genetic testing:  No pathogenic variants detected on the [facility] CancerNext-Expanded + RNAinsight panel. The report date is 10/23/2020.   The CancerNext-Expanded + RNAinsight gene panel offered by [employer] and includes sequencing and rearrangement analysis for the following 77 genes: AIP, ALK, APC, ATM, AXIN2,  BAP1, BARD1, BLM, BMPR1A, BRCA1, BRCA2, BRIP1, CDC73, CDH1, CDK4, CDKN1B, CDKN2A, CHEK2, CTNNA1, DICER1, FANCC, FH, FLCN, GALNT12, KIF1B, LZTR1, MAX, MEN1, MET, MLH1, MSH2, MSH3, MSH6, MUTYH, NBN, NF1, NF2, NTHL1, PALB2, PHOX2B, PMS2, POT1, PRKAR1A, PTCH1, PTEN, RAD51C, RAD51D, RB1, RECQL, RET, SDHA, SDHAF2, SDHB, SDHC, SDHD, SMAD4, SMARCA4, SMARCB1, SMARCE1, STK11, SUFU, TMEM127, TP53, TSC1, TSC2, VHL and XRCC2 (sequencing and deletion/duplication); EGFR, EGLN1, HOXB13, KIT, MITF, PDGFRA, POLD1 and POLE (sequencing only); EPCAM and GREM1 (deletion/duplication only). RNA data is routinely analyzed for use in variant interpretation for all genes.     CHIEF COMPLIANT: Cycle 6 TCH Perjeta  INTERVAL HISTORY: Tammie [name] is a 45 y.o. with above-mentioned history of left breast cancer who is currently on neoadjuvant chemotherapy with [place]. She presents to the clinic today for cycle 6.   ALLERGIES:  has No Known Allergies.  MEDICATIONS:  Current Outpatient Medications  Medication Sig Dispense Refill   B Complex-C (B-COMPLEX WITH VITAMIN C) tablet Take 1 tablet by mouth daily.     Cholecalciferol (DIALYVITE VITAMIN D 5000) 125 MCG (5000 UT) capsule Take 5,000 Units by mouth daily.     citalopram (CELEXA) 20 MG tablet Take 20 mg by mouth daily.     dexamethasone (DECADRON) 4 MG tablet Take 1 tablet (4 mg total) by mouth 2 (two) times daily. Take 1 tablet day before chemo and 1 tablet day after chemo with food 6 tablet 0   ibuprofen (ADVIL) 800 MG tablet Take 800 mg by mouth 2 (two) times daily.     lidocaine-prilocaine (EMLA) cream Apply to affected area once 30 g 3  lisinopril (ZESTRIL) 20 MG tablet Take 1 tablet by mouth daily.     LORazepam (ATIVAN) 0.5 MG tablet Take 1 tablet (0.5 mg total) by mouth at bedtime as needed for anxiety. 10 tablet 0   metoprolol tartrate (LOPRESSOR) 25 MG tablet Take 25 mg by mouth daily.     NON FORMULARY Take 900 mg by mouth daily. Hyaluronic Acid Complex      ondansetron (ZOFRAN) 8 MG tablet Take 1 tablet (8 mg total) by mouth 2 (two) times daily as needed (Nausea or vomiting). Start on the third day after chemotherapy. 30 tablet 1   OVER THE COUNTER MEDICATION Take 4 capsules by mouth daily. Laminine supplement- 620 mg     prochlorperazine (COMPAZINE) 10 MG tablet Take 1 tablet (10 mg total) by mouth every 6 (six) hours as needed (Nausea or vomiting). 30 tablet 1   terbinafine (LAMISIL) 250 MG tablet Take 250 mg by mouth daily.     No current facility-administered medications for this visit.    PHYSICAL EXAMINATION: ECOG PERFORMANCE STATUS: 1 - Symptomatic but completely ambulatory  Vitals:   02/07/21 0841  BP: 128/73  Pulse: 98  Resp: 18  Temp: 97.8 F (36.6 C)  SpO2: 100%   Filed Weights   02/07/21 0841  Weight: 148 lb 1.6 oz (67.2 kg)     LABORATORY DATA:  I have reviewed the data as listed CMP Latest Ref Rng & Units 01/17/2021 12/20/2020 11/29/2020  Glucose 70 - 99 mg/dL 112(H) 117(H) 120(H)  BUN 6 - 20 mg/dL _0 Creatinine 0.44 - 1.00 mg/dL 0.70 0.66 0.71  Sodium 135 - 145 mmol/L 137 139 137  Potassium 3.5 - 5.1 mmol/L 4.0 3.8 3.9  Chloride 98 - 111 mmol/L 103 104 102  CO2 22 - 32 mmol/L _1 Calcium 8.9 - 10.3 mg/dL 10.2 9.7 9.7  Total Protein 6.5 - 8.1 g/dL 7.0 7.0 7.0  Total Bilirubin 0.3 - 1.2 mg/dL 0.3 0.3 0.6  Alkaline Phos 38 - 126 U/L 43 50 51  AST 15 - 41 U/L 14(L) 19 22  ALT 0 - 44 U/L _2 Lab Results  Component Value Date   WBC 9.0 02/07/2021   HGB 9.5 (L) 02/07/2021   HCT 28.5 (L) 02/07/2021   MCV 96.3 02/07/2021   PLT 218 02/07/2021   NEUTROABS 6.7 02/07/2021    ASSESSMENT & PLAN:  Malignant neoplasm of lower-outer quadrant of left breast of female, estrogen receptor positive ([place]) 10/01/2020:Screening mammogram showed a left breast mass and calcifications. Diagnostic mammogram and US showed a 1.7cm and 0.5cm mass at the 5 o'clock position in the left breast, with one 0.4cm  abnormal right axillary lymph node. 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   Treatment Plan: 1. Neoadjuvant chemotherapy with TCH Perjeta 6 cycles followed by Herceptin Perjeta maintenance versus Kadcyla maintenance (based on response to neoadjuvant chemo) for 1 year 2. Followed by breast conserving surgery if possible with sentinel lymph node study 3. Followed by adjuvant radiation therapy  4.  Followed by antiestrogen therapy although ER is weak URCC nausea study Breast MRI: 10/17/20: 2.4 cm left breast mass, intramammary lymph node (needs second look ultrasound and biopsy) ------------------------------------------------------------------------------------------------------------------------- Current treatment: Cycle 6 TCHP Chemo Toxicities: 1. Nausea: Managed with antiementics 2. Diarrhea: managed with imdoium 3. Fatigue 4. Chemotherapy-induced anemia: Today's hemoglobin is 9.5 we are monitoring.  She is  a [employer] and does not receive blood.   Breast MRI scheduled for 02/10/2021     RTC in 3 weeks for Herceptin and Perjeta maintenance.      No orders of the defined types were placed in this encounter.  The patient has a good understanding of the overall plan. she agrees with it. she will call with any problems that may develop before the next visit here.  Total time spent: 30 mins including face to face time and time spent for planning, charting and coordination of care  Rulon Eisenmenger, MD, MPH 02/07/2021  I, Thana Ates, am acting as scribe for Dr. Nicholas Lose.  I have reviewed the above documentation for accuracy and completeness, and I agree with the above.

## 2021-02-07 ENCOUNTER — Inpatient Hospital Stay (HOSPITAL_BASED_OUTPATIENT_CLINIC_OR_DEPARTMENT_OTHER): Payer: 59 | Admitting: Hematology and Oncology

## 2021-02-07 ENCOUNTER — Other Ambulatory Visit: Payer: Self-pay

## 2021-02-07 ENCOUNTER — Inpatient Hospital Stay: Payer: 59

## 2021-02-07 ENCOUNTER — Encounter: Payer: Self-pay | Admitting: *Deleted

## 2021-02-07 DIAGNOSIS — C50512 Malignant neoplasm of lower-outer quadrant of left female breast: Secondary | ICD-10-CM

## 2021-02-07 DIAGNOSIS — Z170 Estrogen receptor positive status [ER+]: Secondary | ICD-10-CM | POA: Diagnosis not present

## 2021-02-07 DIAGNOSIS — Z5111 Encounter for antineoplastic chemotherapy: Secondary | ICD-10-CM | POA: Diagnosis not present

## 2021-02-07 DIAGNOSIS — Z95828 Presence of other vascular implants and grafts: Secondary | ICD-10-CM

## 2021-02-07 LAB — CBC WITH DIFFERENTIAL/PLATELET
Abs Immature Granulocytes: 0.04 10*3/uL (ref 0.00–0.07)
Basophils Absolute: 0 10*3/uL (ref 0.0–0.1)
Basophils Relative: 0 %
Eosinophils Absolute: 0 10*3/uL (ref 0.0–0.5)
Eosinophils Relative: 0 %
HCT: 28.5 % — ABNORMAL LOW (ref 36.0–46.0)
Hemoglobin: 9.5 g/dL — ABNORMAL LOW (ref 12.0–15.0)
Immature Granulocytes: 0 %
Lymphocytes Relative: 13 %
Lymphs Abs: 1.2 10*3/uL (ref 0.7–4.0)
MCH: 32.1 pg (ref 26.0–34.0)
MCHC: 33.3 g/dL (ref 30.0–36.0)
MCV: 96.3 fL (ref 80.0–100.0)
Monocytes Absolute: 1 10*3/uL (ref 0.1–1.0)
Monocytes Relative: 12 %
Neutro Abs: 6.7 10*3/uL (ref 1.7–7.7)
Neutrophils Relative %: 75 %
Platelets: 218 10*3/uL (ref 150–400)
RBC: 2.96 MIL/uL — ABNORMAL LOW (ref 3.87–5.11)
RDW: 17 % — ABNORMAL HIGH (ref 11.5–15.5)
WBC: 9 10*3/uL (ref 4.0–10.5)
nRBC: 0 % (ref 0.0–0.2)

## 2021-02-07 LAB — COMPREHENSIVE METABOLIC PANEL
ALT: 13 U/L (ref 0–44)
AST: 12 U/L — ABNORMAL LOW (ref 15–41)
Albumin: 4.2 g/dL (ref 3.5–5.0)
Alkaline Phosphatase: 41 U/L (ref 38–126)
Anion gap: 11 (ref 5–15)
BUN: 14 mg/dL (ref 6–20)
CO2: 25 mmol/L (ref 22–32)
Calcium: 9.6 mg/dL (ref 8.9–10.3)
Chloride: 103 mmol/L (ref 98–111)
Creatinine, Ser: 0.7 mg/dL (ref 0.44–1.00)
GFR, Estimated: >60 mL/min (ref >60)
Glucose, Bld: 128 mg/dL — ABNORMAL HIGH (ref 70–99)
Potassium: 3.8 mmol/L (ref 3.5–5.1)
Sodium: 139 mmol/L (ref 135–145)
Total Bilirubin: 0.3 mg/dL (ref 0.3–1.2)
Total Protein: 6.9 g/dL (ref 6.5–8.1)

## 2021-02-07 LAB — PREGNANCY, URINE: Preg Test, Ur: NEGATIVE

## 2021-02-07 MED ORDER — SODIUM CHLORIDE 0.9 % IV SOLN
75.0000 mg/m2 | Freq: Once | INTRAVENOUS | Status: AC
  Administered 2021-02-07: 140 mg via INTRAVENOUS
  Filled 2021-02-07: qty 14

## 2021-02-07 MED ORDER — PALONOSETRON HCL INJECTION 0.25 MG/5ML
0.2500 mg | Freq: Once | INTRAVENOUS | Status: AC
  Administered 2021-02-07: 0.25 mg via INTRAVENOUS
  Filled 2021-02-07: qty 5

## 2021-02-07 MED ORDER — SODIUM CHLORIDE 0.9 % IV SOLN: Freq: Once | INTRAVENOUS | Status: AC

## 2021-02-07 MED ORDER — SODIUM CHLORIDE 0.9% FLUSH
10.0000 mL | Freq: Once | INTRAVENOUS | Status: AC
  Administered 2021-02-07: 10 mL

## 2021-02-07 MED ORDER — SODIUM CHLORIDE 0.9 % IV SOLN
420.0000 mg | Freq: Once | INTRAVENOUS | Status: AC
  Administered 2021-02-07: 420 mg via INTRAVENOUS
  Filled 2021-02-07: qty 14

## 2021-02-07 MED ORDER — ACETAMINOPHEN 325 MG PO TABS
650.0000 mg | ORAL_TABLET | Freq: Once | ORAL | Status: AC
  Administered 2021-02-07: 650 mg via ORAL
  Filled 2021-02-07: qty 2

## 2021-02-07 MED ORDER — HEPARIN SOD (PORK) LOCK FLUSH 100 UNIT/ML IV SOLN
500.0000 [IU] | Freq: Once | INTRAVENOUS | Status: AC | PRN
  Administered 2021-02-07: 500 [IU]

## 2021-02-07 MED ORDER — TRASTUZUMAB-ANNS CHEMO 150 MG IV SOLR
6.0000 mg/kg | Freq: Once | INTRAVENOUS | Status: AC
  Administered 2021-02-07: 441 mg via INTRAVENOUS
  Filled 2021-02-07: qty 21

## 2021-02-07 MED ORDER — DEXAMETHASONE SODIUM PHOSPHATE 100 MG/10ML IJ SOLN
10.0000 mg | Freq: Once | INTRAMUSCULAR | Status: AC
  Administered 2021-02-07: 10 mg via INTRAVENOUS
  Filled 2021-02-07: qty 10

## 2021-02-07 MED ORDER — SODIUM CHLORIDE 0.9 % IV SOLN
150.0000 mg | Freq: Once | INTRAVENOUS | Status: AC
  Administered 2021-02-07: 150 mg via INTRAVENOUS
  Filled 2021-02-07: qty 150

## 2021-02-07 MED ORDER — SODIUM CHLORIDE 0.9 % IV SOLN
700.0000 mg | Freq: Once | INTRAVENOUS | Status: AC
  Administered 2021-02-07: 700 mg via INTRAVENOUS
  Filled 2021-02-07: qty 70

## 2021-02-07 MED ORDER — DIPHENHYDRAMINE HCL 25 MG PO CAPS
50.0000 mg | ORAL_CAPSULE | Freq: Once | ORAL | Status: AC
  Administered 2021-02-07: 50 mg via ORAL
  Filled 2021-02-07: qty 2

## 2021-02-07 MED ORDER — SODIUM CHLORIDE 0.9% FLUSH
10.0000 mL | INTRAVENOUS | Status: DC | PRN
  Administered 2021-02-07: 10 mL

## 2021-02-07 NOTE — Patient Instructions (Signed)
[facility] ONCOLOGY  Discharge Instructions: Thank you for choosing [facility] to provide your oncology and hematology care.   If you have a lab appointment with the [facility], please go directly to the [facility] and check in at the registration area.   Wear comfortable clothing and clothing appropriate for easy access to any Portacath or PICC line.   We strive to give you quality time with your provider. You may need to reschedule your appointment if you arrive late (15 or more minutes).  Arriving late affects you and other patients whose appointments are after yours.  Also, if you miss three or more appointments without notifying the office, you may be dismissed from the clinic at the provider's discretion.      For prescription refill requests, have your pharmacy contact our office and allow 72 hours for refills to be completed.    Today you received the following chemotherapy and/or immunotherapy agents Kanjinti, Perjeta, Taxotere, & Carboplatin      To help prevent nausea and vomiting after your treatment, we encourage you to take your nausea medication as directed.  BELOW ARE SYMPTOMS THAT SHOULD BE REPORTED IMMEDIATELY: *FEVER GREATER THAN 100.4 F (38 C) OR HIGHER *CHILLS OR SWEATING *NAUSEA AND VOMITING THAT IS NOT CONTROLLED WITH YOUR NAUSEA MEDICATION *UNUSUAL SHORTNESS OF BREATH *UNUSUAL BRUISING OR BLEEDING *URINARY PROBLEMS (pain or burning when urinating, or frequent urination) *BOWEL PROBLEMS (unusual diarrhea, constipation, pain near the anus) TENDERNESS IN MOUTH AND THROAT WITH OR WITHOUT PRESENCE OF ULCERS (sore throat, sores in mouth, or a toothache) UNUSUAL RASH, SWELLING OR PAIN  UNUSUAL VAGINAL DISCHARGE OR ITCHING   Items with * indicate a potential emergency and should be followed up as soon as possible or go to the Emergency Department if any problems should occur.  Please show the CHEMOTHERAPY ALERT CARD or  IMMUNOTHERAPY ALERT CARD at check-in to the Emergency Department and triage nurse.  Should you have questions after your visit or need to cancel or reschedule your appointment, please contact [facility]  Dept: [phone]  and follow the prompts.  Office hours are 8:00 a.m. to 4:30 p.m. Monday - Friday. Please note that voicemails left after 4:00 p.m. may not be returned until the following business day.  We are closed weekends and major holidays. You have access to a nurse at all times for urgent questions. Please call the main number to the clinic Dept: [phone] and follow the prompts.   For any non-urgent questions, you may also contact your provider using MyChart. We now offer e-Visits for anyone 4 and older to request care online for non-urgent symptoms. For details visit [email or web address].   Also download the MyChart app! Go to the app store, search "MyChart", open the app, select [facility], and log in with your MyChart username and password.  Due to Covid, a mask is required upon entering the hospital/clinic. If you do not have a mask, one will be given to you upon arrival. For doctor visits, patients may have 1 support person aged 57 or older with them. For treatment visits, patients cannot have anyone with them due to current Covid guidelines and our immunocompromised population.

## 2021-02-10 ENCOUNTER — Inpatient Hospital Stay: Payer: 59

## 2021-02-10 ENCOUNTER — Ambulatory Visit (HOSPITAL_COMMUNITY)
Admission: RE | Admit: 2021-02-10 | Discharge: 2021-02-10 | Disposition: A | Discharge status: Home / Self Care | Payer: 59 | Source: Ambulatory Visit | Attending: Adult Health | Admitting: Adult Health

## 2021-02-10 ENCOUNTER — Other Ambulatory Visit: Payer: Self-pay

## 2021-02-10 VITALS — BP 109/74 | HR 100 | Temp 98.2°F | Resp 18

## 2021-02-10 DIAGNOSIS — Z170 Estrogen receptor positive status [ER+]: Secondary | ICD-10-CM

## 2021-02-10 DIAGNOSIS — C50512 Malignant neoplasm of lower-outer quadrant of left female breast: Secondary | ICD-10-CM | POA: Insufficient documentation

## 2021-02-10 IMAGING — MR MR BREAST BILAT WO/W CM
6 of 9 series · 29 of 48 positions shown · IV contrast (7ml GADAVIST)
Comparison: [DATE] breast MRI

CLINICAL DATA: Recent diagnosis of left breast cancer at 2 sites.
Follow-up after neoadjuvant chemotherapy.

LABS:  None
EXAM:
BILATERAL BREAST MRI WITH AND WITHOUT CONTRAST
TECHNIQUE: Multiplanar, multisequence MR images of both breasts were obtained
prior to and following the intravenous administration of 7 ml of
Gadavist

[Series 2: T2 · axial · 3.0mm · 0.91mm/px · 1 of 52 slices shown]
[im 1/52]
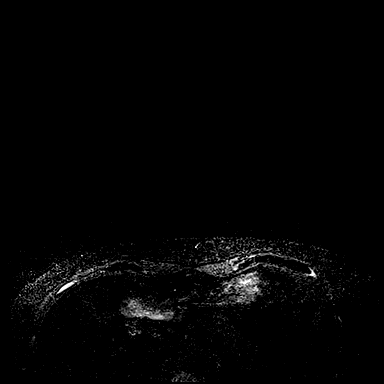

[Series 3: T1 fat-sat · axial · 1.2mm · 0.78mm/px · z∈[-87,+104]mm · 5 of 160 slices shown (1 of 4)]
[im 1/160]
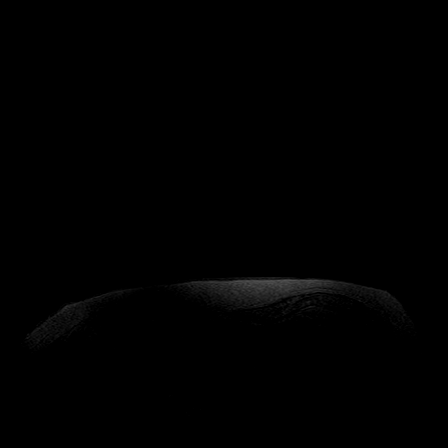
[im 40/160]
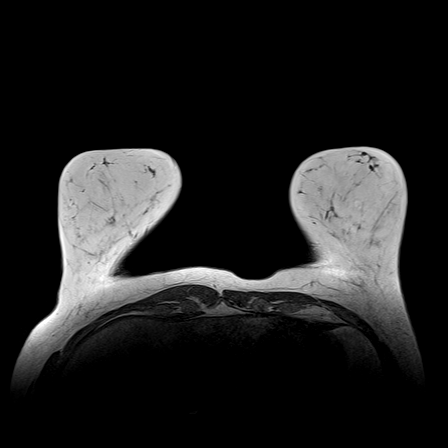
[im 80/160]
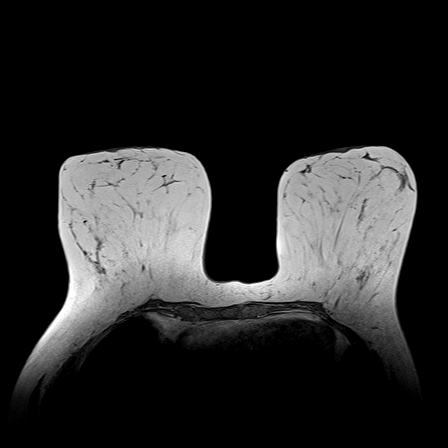
[im 120/160]
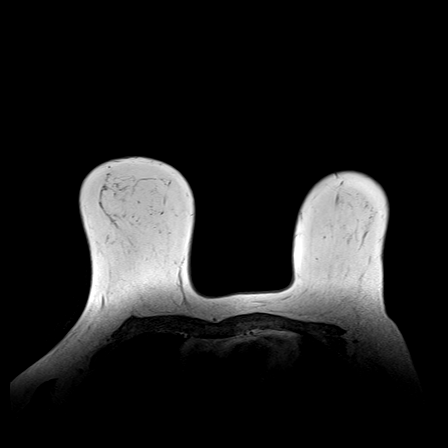
[im 160/160]
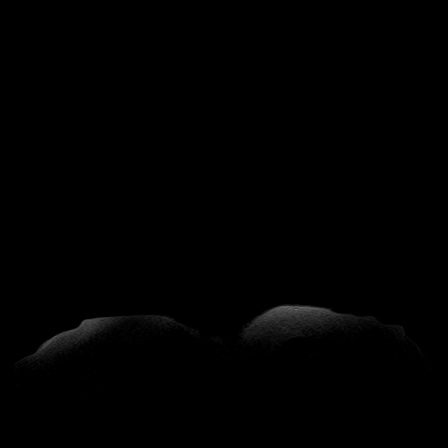

[Series 6: T1 fat-sat · axial · 1.2mm · 0.84mm/px · z∈[-87,+104]mm · 6 of 160 slices shown (2 of 4)]
[im 1/160]
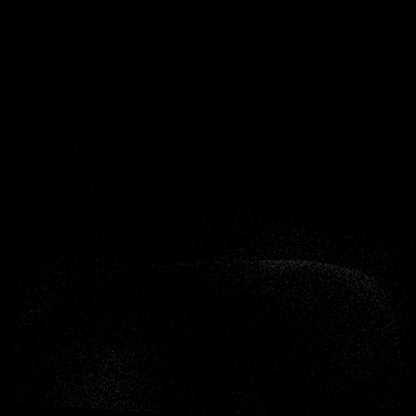
[im 32/160]
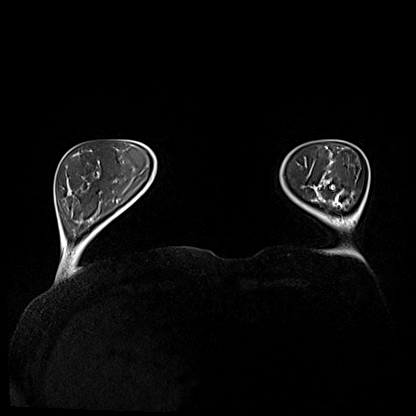
[im 64/160]
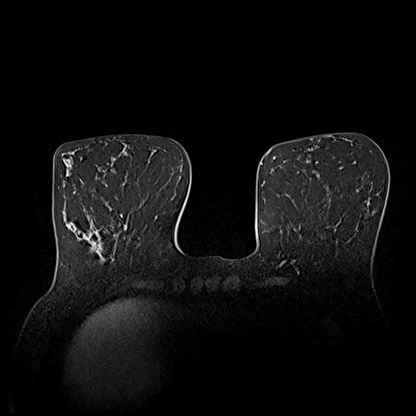
[im 96/160]
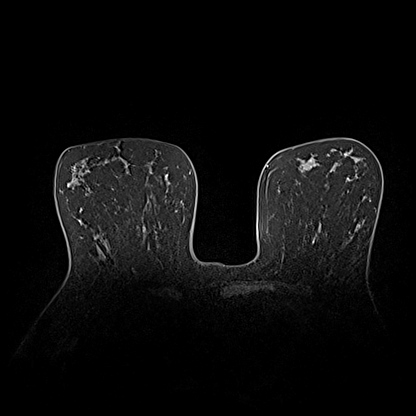
[im 128/160]
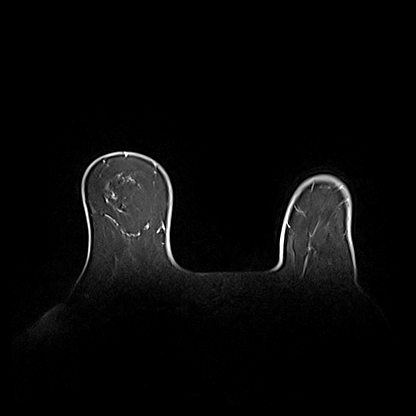
[im 160/160]
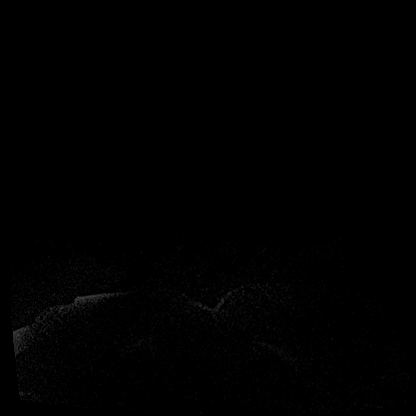

[Series 7: T1 fat-sat · axial · 1.2mm · 0.84mm/px · z∈[-87,+104]mm · 6 of 160 slices shown (3 of 4)]
[im 1/160]
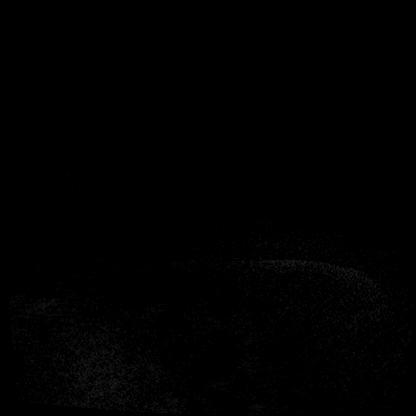
[im 32/160]
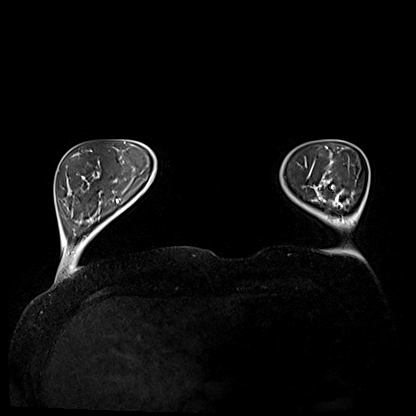
[im 64/160]
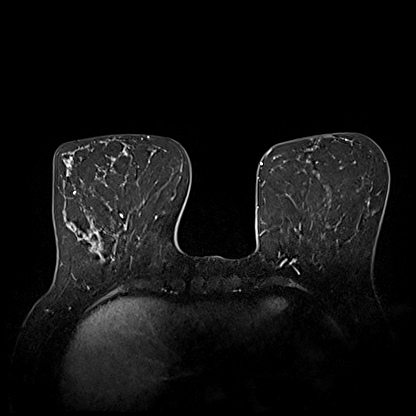
[im 96/160]
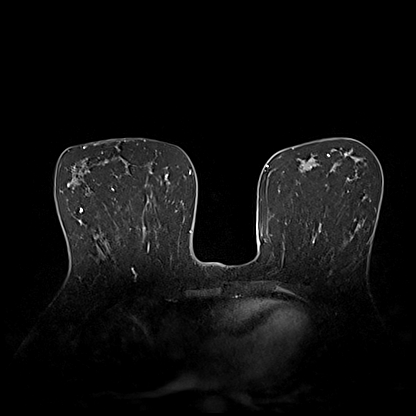
[im 128/160]
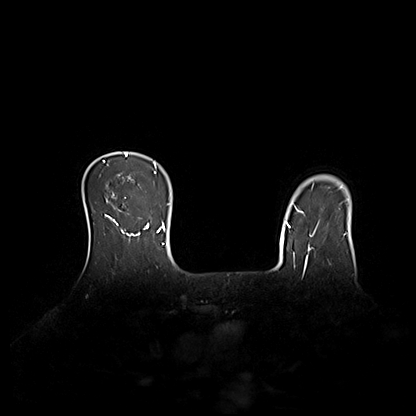
[im 160/160]
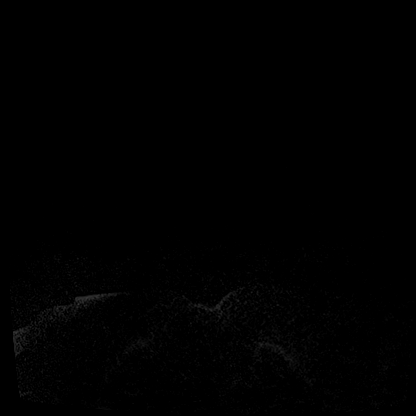

[Series 8: T1 · axial · 1.2mm · 0.84mm/px · z∈[-87,+104]mm · 6 of 160 slices shown]
[im 1/160]
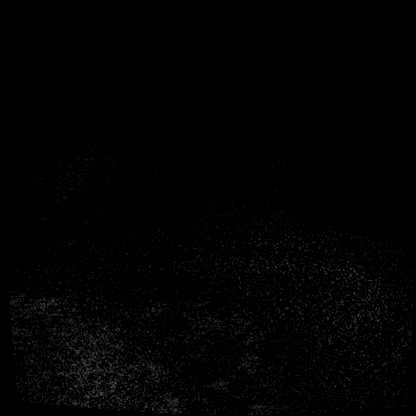
[im 32/160]
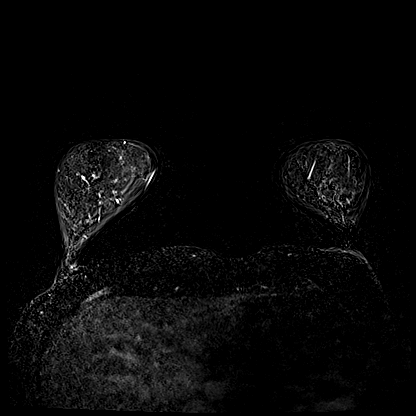
[im 64/160]
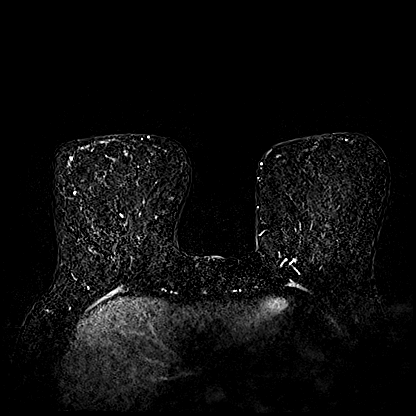
[im 96/160]
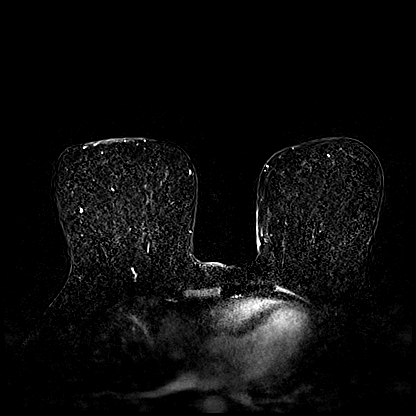
[im 128/160]
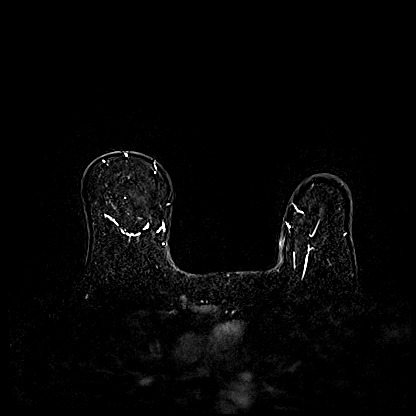
[im 160/160]
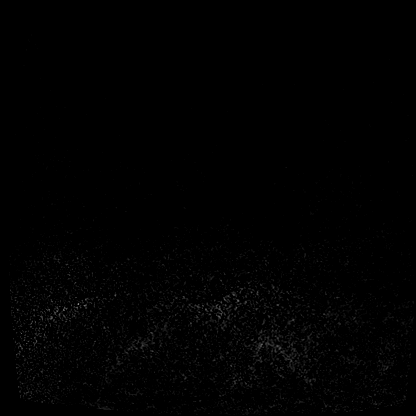

[Series 11: T1 fat-sat · axial · 1.2mm · 0.84mm/px · z∈[-87,+65]mm · 5 of 160 slices shown (4 of 4)]
[im 1/160]
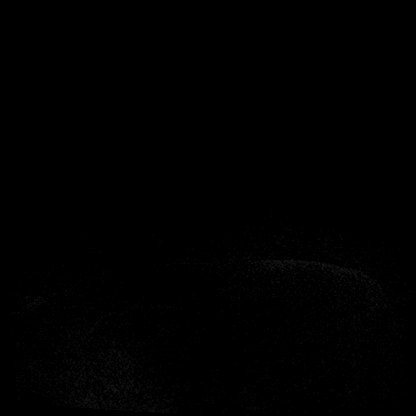
[im 32/160]
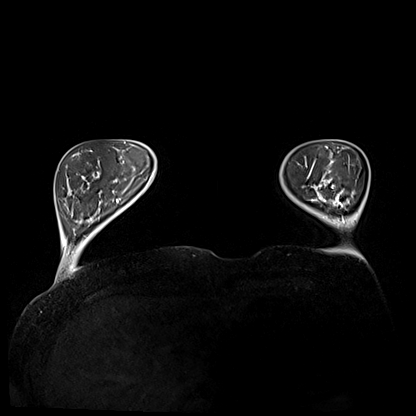
[im 64/160]
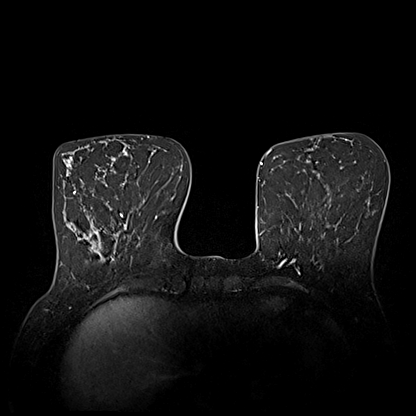
[im 96/160]
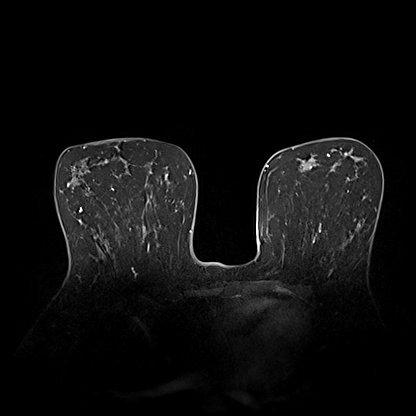
[im 128/160]
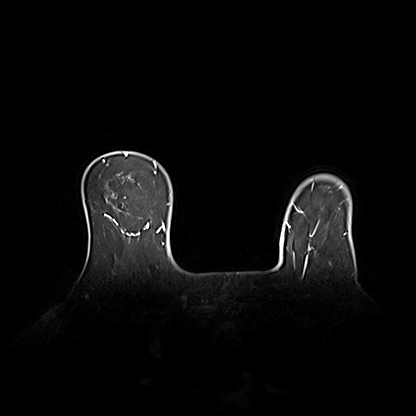

[29 of 48 positions shown; findings below may reference images not displayed]

Three-dimensional MR images were rendered by post-processing of the
original MR data on an independent workstation. The
three-dimensional MR images were interpreted, and findings are
reported in the following complete MRI report for this study. Three
dimensional images were evaluated at the independent interpreting
workstation using the DynaCAD thin client.
FINDINGS: Breast composition: b. Scattered fibroglandular tissue.

Background parenchymal enhancement: Minimal

Right breast: No mass or abnormal enhancement.

Left breast: No mass or abnormal enhancement. The previously
identified enhancing masses in the left breast at the site of known
malignancy are no longer visualized.

Lymph nodes: No abnormal appearing lymph nodes are identified. The
previously biopsied metastatic node in the high left axilla on the
previous MRI has returned to normal size.

Ancillary findings:  None.
IMPRESSION: Complete response to chemotherapy. The previously biopsied
malignancies in the breast and the previously biopsied metastatic
node in the left axilla are no longer visualized.

RECOMMENDATION:
Recommend continued surgical and oncologic follow up.

BI-RADS CATEGORY  6: Known biopsy-proven malignancy.

## 2021-02-10 MED ORDER — GADOBUTROL 1 MMOL/ML IV SOLN
7.0000 mL | Freq: Once | INTRAVENOUS | Status: AC | PRN
  Administered 2021-02-10: 7 mL via INTRAVENOUS

## 2021-02-10 MED ORDER — PEGFILGRASTIM-CBQV 6 MG/0.6ML ~~LOC~~ SOSY
6.0000 mg | PREFILLED_SYRINGE | Freq: Once | SUBCUTANEOUS | Status: AC
  Administered 2021-02-10: 6 mg via SUBCUTANEOUS
  Filled 2021-02-10: qty 0.6

## 2021-02-13 ENCOUNTER — Encounter: Payer: Self-pay | Admitting: *Deleted

## 2021-02-22 ENCOUNTER — Ambulatory Visit: Payer: Self-pay | Admitting: Surgery

## 2021-02-22 DIAGNOSIS — C50912 Malignant neoplasm of unspecified site of left female breast: Secondary | ICD-10-CM

## 2021-02-22 NOTE — H&P (Signed)
Chief Complaint: Breast Cancer   Breast [facility]DC-10/09/2020 Tammie [name] PCP-Tammie [name]  History of Present Illness: Tammie [name] is a 45 y.o. female who is seen today for breast cancer.  This is a 45 year old female with a family history of breast cancer in her mother at age 74 who presented earlier this year with a screening detected mass in the left lower outer quadrant.  Imaging showed a 1.7 x 1.1 x 1.3 cm mass at 5:00 located 6 cm from the nipple with an adjacent 0.5 cm mass.  Axillary ultrasound showed a single abnormal lymph node.  Both masses and the lymph node were biopsied.  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  The lymph node was positive for metastatic carcinoma.  On 10/17/2020, she underwent placement of a port for neoadjuvant chemotherapy.  She recently completed her course of neoadjuvant chemotherapy with plans to continue Herceptin.  The patient recently underwent breast MRI which showed complete resolution of the masses in the the left breast as well as resolution of the enlarged abnormal lymph node in the axilla.  She returns to discuss her MRI findings and to plan for definitive surgery.  She received her last cycle of chemotherapy about 2 weeks ago.  She is still mildly symptomatic with fatigue, generalized weakness, and diarrhea.  Review of Systems: A complete review of systems was obtained from the patient.  I have reviewed this information and discussed as appropriate with the patient.  See HPI as well for other ROS.  Review of Systems  Constitutional: Positive for malaise/fatigue.  HENT: Negative.   Eyes: Negative.   Respiratory: Negative.   Cardiovascular: Negative.   Gastrointestinal: Positive for diarrhea.  Genitourinary: Negative.   Musculoskeletal: Negative.   Skin: Negative.   Neurological: Positive for weakness.  Endo/Heme/Allergies: Negative.   Psychiatric/Behavioral: Negative.        Medical History: Past Medical History:  Diagnosis Date   Anemia    Anxiety    Arrhythmia    History of cancer    Hypertension     Patient Active Problem List  Diagnosis   Malignant neoplasm of lower-outer quadrant of left breast of female, estrogen receptor positive (CMS-HCC)    Past Surgical History:  Procedure Laterality Date   LAPAROSCOPIC CHOLECYSTECTOMY       No Known Allergies  Current Outpatient Medications on File Prior to Visit  Medication Sig Dispense Refill   citalopram (CELEXA) 20 MG tablet Take 20 mg by mouth once daily     lisinopriL (ZESTRIL) 10 MG tablet Take 10 mg by mouth once daily     metoprolol tartrate (LOPRESSOR) 25 MG tablet Take 25 mg by mouth once daily     ascorbic acid, vitamin C, (VITAMIN C) 1000 MG tablet Take 1,000 mg by mouth once daily     cholecalciferol (CHOLECALCIFEROL) 1000 unit tablet Take by mouth     ferrous sulfate 325 (65 FE) MG tablet Take 325 mg by mouth daily with breakfast     No current facility-administered medications on file prior to visit.    Family History  Problem Relation Age of Onset   High blood pressure (Hypertension) Mother    Breast cancer Mother    Breast cancer Paternal Aunt    Diabetes Maternal Grandmother      Social History   Tobacco Use  Smoking Status Never Smoker  Smokeless Tobacco Never Used     Social History   Socioeconomic History  Marital status: Married  Tobacco Use   Smoking status: Never Smoker   Smokeless tobacco: Never Used  Substance and Sexual Activity   Alcohol use: Never   Drug use: Never    Objective:    Vitals:   02/21/21 1131  BP: (!) 140/86  Pulse: 100  Temp: 36.7 C (98 F)  SpO2: 100%  Weight: 65.2 kg (143 lb 12.8 oz)  Height: 160 cm (5' 3")    Body mass index is 25.47 kg/m.  Physical Exam   Constitutional:  WDWN in NAD, conversant, no obvious deformities; lying in bed comfortably Eyes:  Pupils equal, round; sclera anicteric; moist conjunctiva;  no lid lag HENT:  Oral mucosa moist; good dentition  Neck:  No masses palpated, trachea midline; no thyromegaly Lungs:  CTA bilaterally; normal respiratory effort Breasts:  symmetric, no nipple changes; no palpable masses or lymphadenopathy on either side Port site - c/d/i CV:  Regular rate and rhythm; no murmurs; extremities well-perfused with no edema Abd:  +bowel sounds, soft, non-tender, no palpable organomegaly; no palpable hernias Musc:  Unable to assess gait; no apparent clubbing or cyanosis in extremities Lymphatic:  No palpable cervical or axillary lymphadenopathy Skin:  Warm, dry; no sign of jaundice Psychiatric - alert and oriented x 4; calm mood and affect   Labs, Imaging and Diagnostic Testing:  CLINICAL DATA:  Recent diagnosis of left breast cancer at 2 sites. Follow-up after neoadjuvant chemotherapy.   LABS:  None   EXAM: BILATERAL BREAST MRI WITH AND WITHOUT CONTRAST   TECHNIQUE: Multiplanar, multisequence MR images of both breasts were obtained prior to and following the intravenous administration of 7 ml of Gadavist   Three-dimensional MR images were rendered by post-processing of the original MR data on an independent workstation. The three-dimensional MR images were interpreted, and findings are reported in the following complete MRI report for this study. Three dimensional images were evaluated at the independent interpreting workstation using the DynaCAD thin client.   COMPARISON:  Oct 16, 2020 breast MRI   FINDINGS: Breast composition: b. Scattered fibroglandular tissue.   Background parenchymal enhancement: Minimal   Right breast: No mass or abnormal enhancement.   Left breast: No mass or abnormal enhancement. The previously identified enhancing masses in the left breast at the site of known malignancy are no longer visualized.   Lymph nodes: No abnormal appearing lymph nodes are identified. The previously biopsied metastatic node in the high  left axilla on the previous MRI has returned to normal size.   Ancillary findings:  None.   IMPRESSION: Complete response to chemotherapy. The previously biopsied malignancies in the breast and the previously biopsied metastatic node in the left axilla are no longer visualized.   RECOMMENDATION: Recommend continued surgical and oncologic follow up.   BI-RADS CATEGORY  6: Known biopsy-proven malignancy.     Electronically Signed   By: Dorise Bullion III M.D.   On: 02/11/2021 14:46     Assessment and Plan:  Diagnoses and all orders for this visit:  Malignant neoplasm of lower-outer quadrant of left breast of female, estrogen receptor positive (CMS-HCC)    Plan left radioactive seed localized lumpectomy x2, left targeted axillary lymph node dissection, left axillary sentinel lymph node biopsy, blue dye injection.The surgical procedure has been discussed with the patient.  Potential risks, benefits, alternative treatments, and expected outcomes have been explained.  All of the patient's questions at this time have been answered.  The likelihood of reaching the patient's treatment goal is good.  The patient understand the proposed surgical procedure and wishes to proceed.   No follow-ups on file.  Carlean Jews, MD   02/22/2021 12:22 PM

## 2021-02-22 NOTE — H&P (View-Only) (Signed)
Chief Complaint: Breast Cancer   Breast MDC-10/09/2020 Kinard Gudena PCP-Chelsea Poe  History of Present Illness: Tammie [name] is a 45 y.o. female who is seen today for breast cancer.  This is a 45-year-old female with a family history of breast cancer in her mother at age 57 who presented earlier this year with a screening detected mass in the left lower outer quadrant.  Imaging showed a 1.7 x 1.1 x 1.3 cm mass at 5:00 located 6 cm from the nipple with an adjacent 0.5 cm mass.  Axillary ultrasound showed a single abnormal lymph node.  Both masses and the lymph node were biopsied.  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  The lymph node was positive for metastatic carcinoma.  On 10/17/2020, she underwent placement of a port for neoadjuvant chemotherapy.  She recently completed her course of neoadjuvant chemotherapy with plans to continue Herceptin.  The patient recently underwent breast MRI which showed complete resolution of the masses in the the left breast as well as resolution of the enlarged abnormal lymph node in the axilla.  She returns to discuss her MRI findings and to plan for definitive surgery.  She received her last cycle of chemotherapy about 2 weeks ago.  She is still mildly symptomatic with fatigue, generalized weakness, and diarrhea.  Review of Systems: A complete review of systems was obtained from the patient.  I have reviewed this information and discussed as appropriate with the patient.  See HPI as well for other ROS.  Review of Systems  Constitutional: Positive for malaise/fatigue.  HENT: Negative.   Eyes: Negative.   Respiratory: Negative.   Cardiovascular: Negative.   Gastrointestinal: Positive for diarrhea.  Genitourinary: Negative.   Musculoskeletal: Negative.   Skin: Negative.   Neurological: Positive for weakness.  Endo/Heme/Allergies: Negative.   Psychiatric/Behavioral: Negative.        Medical History: Past Medical History:  Diagnosis Date   Anemia    Anxiety    Arrhythmia    History of cancer    Hypertension     Patient Active Problem List  Diagnosis   Malignant neoplasm of lower-outer quadrant of left breast of female, estrogen receptor positive (CMS-HCC)    Past Surgical History:  Procedure Laterality Date   LAPAROSCOPIC CHOLECYSTECTOMY       No Known Allergies  Current Outpatient Medications on File Prior to Visit  Medication Sig Dispense Refill   citalopram (CELEXA) 20 MG tablet Take 20 mg by mouth once daily     lisinopriL (ZESTRIL) 10 MG tablet Take 10 mg by mouth once daily     metoprolol tartrate (LOPRESSOR) 25 MG tablet Take 25 mg by mouth once daily     ascorbic acid, vitamin C, (VITAMIN C) 1000 MG tablet Take 1,000 mg by mouth once daily     cholecalciferol (CHOLECALCIFEROL) 1000 unit tablet Take by mouth     ferrous sulfate 325 (65 FE) MG tablet Take 325 mg by mouth daily with breakfast     No current facility-administered medications on file prior to visit.    Family History  Problem Relation Age of Onset   High blood pressure (Hypertension) Mother    Breast cancer Mother    Breast cancer Paternal Aunt    Diabetes Maternal Grandmother      Social History   Tobacco Use  Smoking Status Never Smoker  Smokeless Tobacco Never Used     Social History   Socioeconomic History     Marital status: Married  Tobacco Use   Smoking status: Never Smoker   Smokeless tobacco: Never Used  Substance and Sexual Activity   Alcohol use: Never   Drug use: Never    Objective:    Vitals:   02/21/21 1131  BP: (!) 140/86  Pulse: 100  Temp: 36.7 C (98 F)  SpO2: 100%  Weight: 65.2 kg (143 lb 12.8 oz)  Height: 160 cm (5' 3")    Body mass index is 25.47 kg/m.  Physical Exam   Constitutional:  WDWN in NAD, conversant, no obvious deformities; lying in bed comfortably Eyes:  Pupils equal, round; sclera anicteric; moist conjunctiva;  no lid lag HENT:  Oral mucosa moist; good dentition  Neck:  No masses palpated, trachea midline; no thyromegaly Lungs:  CTA bilaterally; normal respiratory effort Breasts:  symmetric, no nipple changes; no palpable masses or lymphadenopathy on either side Port site - c/d/i CV:  Regular rate and rhythm; no murmurs; extremities well-perfused with no edema Abd:  +bowel sounds, soft, non-tender, no palpable organomegaly; no palpable hernias Musc:  Unable to assess gait; no apparent clubbing or cyanosis in extremities Lymphatic:  No palpable cervical or axillary lymphadenopathy Skin:  Warm, dry; no sign of jaundice Psychiatric - alert and oriented x 4; calm mood and affect   Labs, Imaging and Diagnostic Testing:  CLINICAL DATA:  Recent diagnosis of left breast cancer at 2 sites. Follow-up after neoadjuvant chemotherapy.   LABS:  None   EXAM: BILATERAL BREAST MRI WITH AND WITHOUT CONTRAST   TECHNIQUE: Multiplanar, multisequence MR images of both breasts were obtained prior to and following the intravenous administration of 7 ml of Gadavist   Three-dimensional MR images were rendered by post-processing of the original MR data on an independent workstation. The three-dimensional MR images were interpreted, and findings are reported in the following complete MRI report for this study. Three dimensional images were evaluated at the independent interpreting workstation using the DynaCAD thin client.   COMPARISON:  Oct 16, 2020 breast MRI   FINDINGS: Breast composition: b. Scattered fibroglandular tissue.   Background parenchymal enhancement: Minimal   Right breast: No mass or abnormal enhancement.   Left breast: No mass or abnormal enhancement. The previously identified enhancing masses in the left breast at the site of known malignancy are no longer visualized.   Lymph nodes: No abnormal appearing lymph nodes are identified. The previously biopsied metastatic node in the high  left axilla on the previous MRI has returned to normal size.   Ancillary findings:  None.   IMPRESSION: Complete response to chemotherapy. The previously biopsied malignancies in the breast and the previously biopsied metastatic node in the left axilla are no longer visualized.   RECOMMENDATION: Recommend continued surgical and oncologic follow up.   BI-RADS CATEGORY  6: Known biopsy-proven malignancy.     Electronically Signed   By: David  Williams III M.D.   On: 02/11/2021 14:46     Assessment and Plan:  Diagnoses and all orders for this visit:  Malignant neoplasm of lower-outer quadrant of left breast of female, estrogen receptor positive (CMS-HCC)    Plan left radioactive seed localized lumpectomy x2, left targeted axillary lymph node dissection, left axillary sentinel lymph node biopsy, blue dye injection.The surgical procedure has been discussed with the patient.  Potential risks, benefits, alternative treatments, and expected outcomes have been explained.  All of the patient's questions at this time have been answered.  The likelihood of reaching the patient's treatment goal is good.    The patient understand the proposed surgical procedure and wishes to proceed.   No follow-ups on file.  Demetrice Amstutz KAI Joell Buerger, MD   02/22/2021 12:22 PM 

## 2021-02-25 ENCOUNTER — Other Ambulatory Visit: Payer: Self-pay | Admitting: Surgery

## 2021-02-25 ENCOUNTER — Encounter: Payer: Self-pay | Admitting: *Deleted

## 2021-02-25 ENCOUNTER — Encounter: Payer: Self-pay | Admitting: Licensed Clinical Social Worker

## 2021-02-25 ENCOUNTER — Telehealth: Payer: Self-pay | Admitting: Radiation Oncology

## 2021-02-25 DIAGNOSIS — C50912 Malignant neoplasm of unspecified site of left female breast: Secondary | ICD-10-CM

## 2021-02-25 DIAGNOSIS — Z170 Estrogen receptor positive status [ER+]: Secondary | ICD-10-CM

## 2021-02-25 DIAGNOSIS — C50512 Malignant neoplasm of lower-outer quadrant of left female breast: Secondary | ICD-10-CM

## 2021-02-25 NOTE — Progress Notes (Signed)
[facility] CSW Progress Note  Holiday representative received notification that patient has been approved for assistance through the [employer]. Pt was also notified by [employer] via email.  Patient was also approved for assistance through [facility] in [place].   Christeen Douglas , LCSW

## 2021-02-26 ENCOUNTER — Other Ambulatory Visit: Payer: Self-pay | Admitting: Hematology and Oncology

## 2021-02-27 ENCOUNTER — Inpatient Hospital Stay: Payer: 59 | Attending: Hematology and Oncology

## 2021-02-27 ENCOUNTER — Other Ambulatory Visit: Payer: Self-pay

## 2021-02-27 VITALS — BP 106/68 | HR 72 | Temp 98.7°F | Resp 18 | Wt 145.5 lb

## 2021-02-27 DIAGNOSIS — Z79899 Other long term (current) drug therapy: Secondary | ICD-10-CM | POA: Diagnosis not present

## 2021-02-27 DIAGNOSIS — Z170 Estrogen receptor positive status [ER+]: Secondary | ICD-10-CM | POA: Insufficient documentation

## 2021-02-27 DIAGNOSIS — Z5111 Encounter for antineoplastic chemotherapy: Secondary | ICD-10-CM | POA: Diagnosis present

## 2021-02-27 DIAGNOSIS — C50512 Malignant neoplasm of lower-outer quadrant of left female breast: Secondary | ICD-10-CM | POA: Diagnosis present

## 2021-02-27 DIAGNOSIS — C50912 Malignant neoplasm of unspecified site of left female breast: Secondary | ICD-10-CM

## 2021-02-27 LAB — CBC WITH DIFFERENTIAL/PLATELET
Abs Immature Granulocytes: 0.01 10*3/uL (ref 0.00–0.07)
Basophils Absolute: 0 10*3/uL (ref 0.0–0.1)
Basophils Relative: 0 %
Eosinophils Absolute: 0 10*3/uL (ref 0.0–0.5)
Eosinophils Relative: 0 %
HCT: 25.4 % — ABNORMAL LOW (ref 36.0–46.0)
Hemoglobin: 8.7 g/dL — ABNORMAL LOW (ref 12.0–15.0)
Immature Granulocytes: 0 %
Lymphocytes Relative: 22 %
Lymphs Abs: 0.9 10*3/uL (ref 0.7–4.0)
MCH: 33.1 pg (ref 26.0–34.0)
MCHC: 34.3 g/dL (ref 30.0–36.0)
MCV: 96.6 fL (ref 80.0–100.0)
Monocytes Absolute: 0.6 10*3/uL (ref 0.1–1.0)
Monocytes Relative: 15 %
Neutro Abs: 2.6 10*3/uL (ref 1.7–7.7)
Neutrophils Relative %: 63 %
Platelets: 148 10*3/uL — ABNORMAL LOW (ref 150–400)
RBC: 2.63 MIL/uL — ABNORMAL LOW (ref 3.87–5.11)
RDW: 16.1 % — ABNORMAL HIGH (ref 11.5–15.5)
WBC: 4.2 10*3/uL (ref 4.0–10.5)
nRBC: 0 % (ref 0.0–0.2)

## 2021-02-27 LAB — COMPREHENSIVE METABOLIC PANEL
ALT: 13 U/L (ref 0–44)
AST: 14 U/L — ABNORMAL LOW (ref 15–41)
Albumin: 3.8 g/dL (ref 3.5–5.0)
Alkaline Phosphatase: 40 U/L (ref 38–126)
Anion gap: 10 (ref 5–15)
BUN: 13 mg/dL (ref 6–20)
CO2: 25 mmol/L (ref 22–32)
Calcium: 8.8 mg/dL — ABNORMAL LOW (ref 8.9–10.3)
Chloride: 105 mmol/L (ref 98–111)
Creatinine, Ser: 0.67 mg/dL (ref 0.44–1.00)
GFR, Estimated: >60 mL/min (ref >60)
Glucose, Bld: 102 mg/dL — ABNORMAL HIGH (ref 70–99)
Potassium: 3.9 mmol/L (ref 3.5–5.1)
Sodium: 140 mmol/L (ref 135–145)
Total Bilirubin: <0.2 mg/dL — ABNORMAL LOW (ref 0.3–1.2)
Total Protein: 6.2 g/dL — ABNORMAL LOW (ref 6.5–8.1)

## 2021-02-27 MED ORDER — ACETAMINOPHEN 325 MG PO TABS
650.0000 mg | ORAL_TABLET | Freq: Once | ORAL | Status: AC
  Administered 2021-02-27: 650 mg via ORAL

## 2021-02-27 MED ORDER — SODIUM CHLORIDE 0.9 % IV SOLN: Freq: Once | INTRAVENOUS | Status: AC

## 2021-02-27 MED ORDER — SODIUM CHLORIDE 0.9 % IV SOLN
6.0000 mg/kg | Freq: Once | INTRAVENOUS | Status: AC
  Administered 2021-02-27: 441 mg via INTRAVENOUS
  Filled 2021-02-27: qty 21

## 2021-02-27 MED ORDER — DIPHENHYDRAMINE HCL 25 MG PO CAPS
ORAL_CAPSULE | ORAL | Status: AC
  Filled 2021-02-27: qty 2

## 2021-02-27 MED ORDER — SODIUM CHLORIDE 0.9 % IV SOLN
420.0000 mg | Freq: Once | INTRAVENOUS | Status: AC
  Administered 2021-02-27: 420 mg via INTRAVENOUS
  Filled 2021-02-27: qty 14

## 2021-02-27 MED ORDER — DIPHENHYDRAMINE HCL 25 MG PO CAPS
50.0000 mg | ORAL_CAPSULE | Freq: Once | ORAL | Status: AC
  Administered 2021-02-27: 50 mg via ORAL

## 2021-02-27 MED ORDER — ACETAMINOPHEN 325 MG PO TABS
ORAL_TABLET | ORAL | Status: AC
  Filled 2021-02-27: qty 2

## 2021-02-27 MED ORDER — SODIUM CHLORIDE 0.9% FLUSH
10.0000 mL | INTRAVENOUS | Status: DC | PRN
  Administered 2021-02-27: 10 mL

## 2021-02-27 MED ORDER — HEPARIN SOD (PORK) LOCK FLUSH 100 UNIT/ML IV SOLN
500.0000 [IU] | Freq: Once | INTRAVENOUS | Status: AC | PRN
Start: 2021-02-27 — End: 2021-02-27
  Administered 2021-02-27: 500 [IU]

## 2021-02-27 NOTE — Patient Instructions (Signed)
[facility]  Discharge Instructions: Thank you for choosing [facility] to provide your oncology and hematology care.   If you have a lab appointment with the [facility], please go directly to the [facility] and check in at the registration area.   Wear comfortable clothing and clothing appropriate for easy access to any Portacath or PICC line.   We strive to give you quality time with your provider. You may need to reschedule your appointment if you arrive late (15 or more minutes).  Arriving late affects you and other patients whose appointments are after yours.  Also, if you miss three or more appointments without notifying the office, you may be dismissed from the clinic at the provider's discretion.      For prescription refill requests, have your pharmacy contact our office and allow 72 hours for refills to be completed.    Today you received the following chemotherapy and/or immunotherapy agents: Trastuzumab (Herceptin), and Pertuzumab (Perjeta)   To help prevent nausea and vomiting after your treatment, we encourage you to take your nausea medication as directed.  BELOW ARE SYMPTOMS THAT SHOULD BE REPORTED IMMEDIATELY: *FEVER GREATER THAN 100.4 F (38 C) OR HIGHER *CHILLS OR SWEATING *NAUSEA AND VOMITING THAT IS NOT CONTROLLED WITH YOUR NAUSEA MEDICATION *UNUSUAL SHORTNESS OF BREATH *UNUSUAL BRUISING OR BLEEDING *URINARY PROBLEMS (pain or burning when urinating, or frequent urination) *BOWEL PROBLEMS (unusual diarrhea, constipation, pain near the anus) TENDERNESS IN MOUTH AND THROAT WITH OR WITHOUT PRESENCE OF ULCERS (sore throat, sores in mouth, or a toothache) UNUSUAL RASH, SWELLING OR PAIN  UNUSUAL VAGINAL DISCHARGE OR ITCHING   Items with * indicate a potential emergency and should be followed up as soon as possible or go to the Emergency Department if any problems should occur.  Please show the CHEMOTHERAPY ALERT CARD or  IMMUNOTHERAPY ALERT CARD at check-in to the Emergency Department and triage nurse.  Should you have questions after your visit or need to cancel or reschedule your appointment, please contact [facility]  Dept: [phone]  and follow the prompts.  Office hours are 8:00 a.m. to 4:30 p.m. Monday - Friday. Please note that voicemails left after 4:00 p.m. may not be returned until the following business day.  We are closed weekends and major holidays. You have access to a nurse at all times for urgent questions. Please call the main number to the clinic Dept: [phone] and follow the prompts.   For any non-urgent questions, you may also contact your provider using MyChart. We now offer e-Visits for anyone 67 and older to request care online for non-urgent symptoms. For details visit [email or web address].   Also download the MyChart app! Go to the app store, search "MyChart", open the app, select [facility], and log in with your MyChart username and password.  Due to Covid, a mask is required upon entering the hospital/clinic. If you do not have a mask, one will be given to you upon arrival. For doctor visits, patients may have 1 support person aged 69 or older with them. For treatment visits, patients cannot have anyone with them due to current Covid guidelines and our immunocompromised population.

## 2021-02-27 NOTE — Progress Notes (Signed)
Per Dr. Lindi Adie, ok to release treatment prior to lab results.

## 2021-02-27 NOTE — Progress Notes (Signed)
Ok to proceed with Kanjinti '441mg'$  today. Will monitor weight and update dose.  Raul Del [place], [place], BCPS, BCOP 02/27/2021 9:23 AM

## 2021-03-04 ENCOUNTER — Other Ambulatory Visit: Payer: Self-pay

## 2021-03-04 ENCOUNTER — Encounter (HOSPITAL_BASED_OUTPATIENT_CLINIC_OR_DEPARTMENT_OTHER): Payer: Self-pay | Admitting: Surgery

## 2021-03-05 ENCOUNTER — Other Ambulatory Visit: Payer: Self-pay | Admitting: Surgery

## 2021-03-05 DIAGNOSIS — C50912 Malignant neoplasm of unspecified site of left female breast: Secondary | ICD-10-CM

## 2021-03-11 ENCOUNTER — Other Ambulatory Visit: Payer: Self-pay

## 2021-03-11 ENCOUNTER — Ambulatory Visit
Admission: RE | Admit: 2021-03-11 | Discharge: 2021-03-11 | Disposition: A | Discharge status: Home / Self Care | Payer: 59 | Source: Ambulatory Visit | Attending: Surgery | Admitting: Surgery

## 2021-03-11 ENCOUNTER — Other Ambulatory Visit: Payer: Self-pay | Admitting: Surgery

## 2021-03-11 DIAGNOSIS — C50912 Malignant neoplasm of unspecified site of left female breast: Secondary | ICD-10-CM

## 2021-03-11 IMAGING — MG MM PLC BREAST LOC DEV 1ST LESION INC MAMMO GUIDE*L*
8 of 9 series · 8 of 9 positions shown · non-contrast
Comparison: Previous exam(s).

CLINICAL DATA: Patient for preoperative localization prior to left
breast lumpectomy.

EXAM:
MAMMOGRAPHIC GUIDED RADIOACTIVE SEED LOCALIZATION OF THE LEFT BREAST

[L LM (1 of 3)]
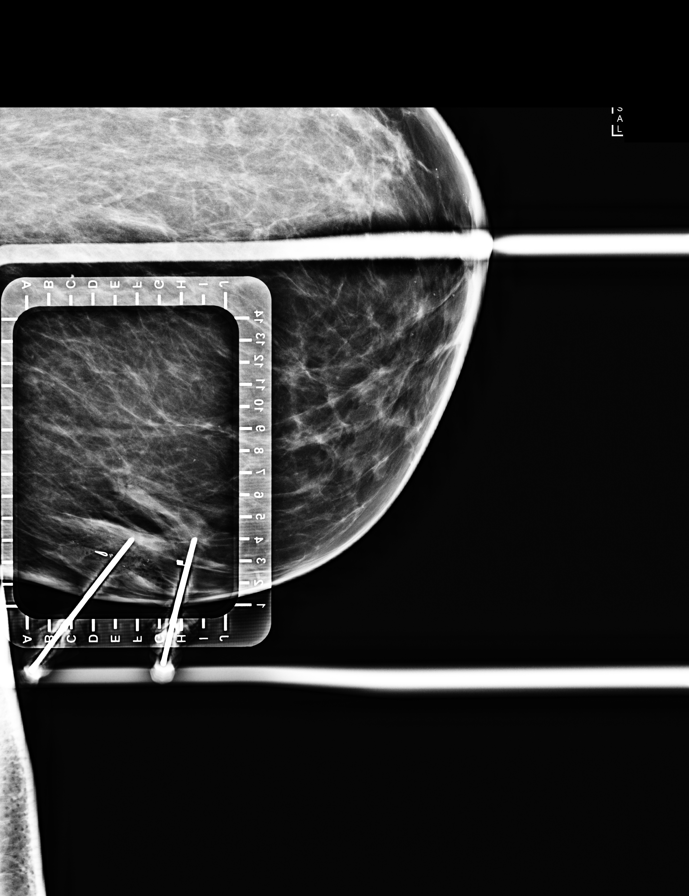

[L LM (2 of 3)]
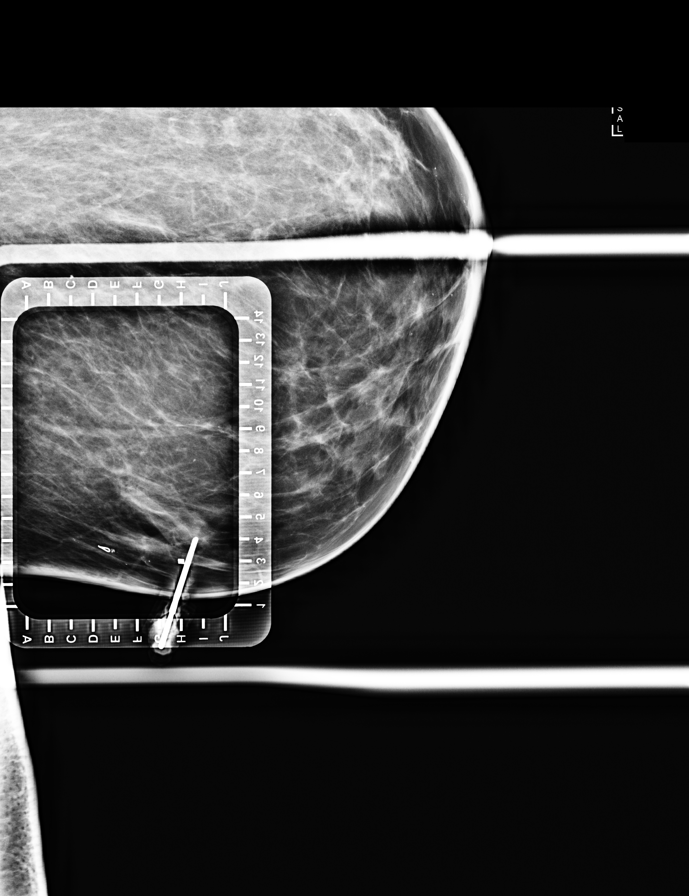

[L CC (1 of 5)]
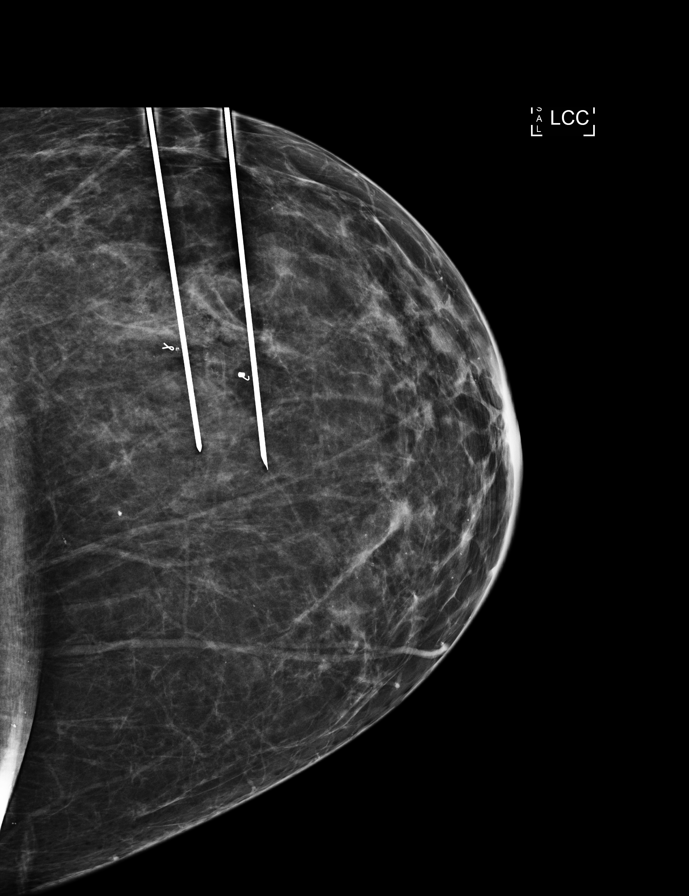

[L LM (3 of 3)]
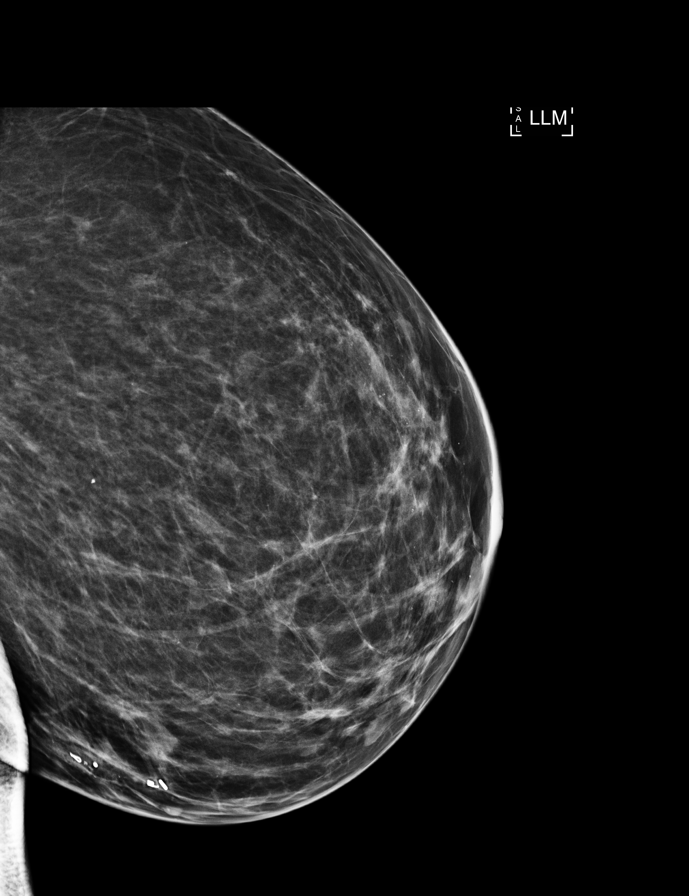

[L CC (2 of 5)]
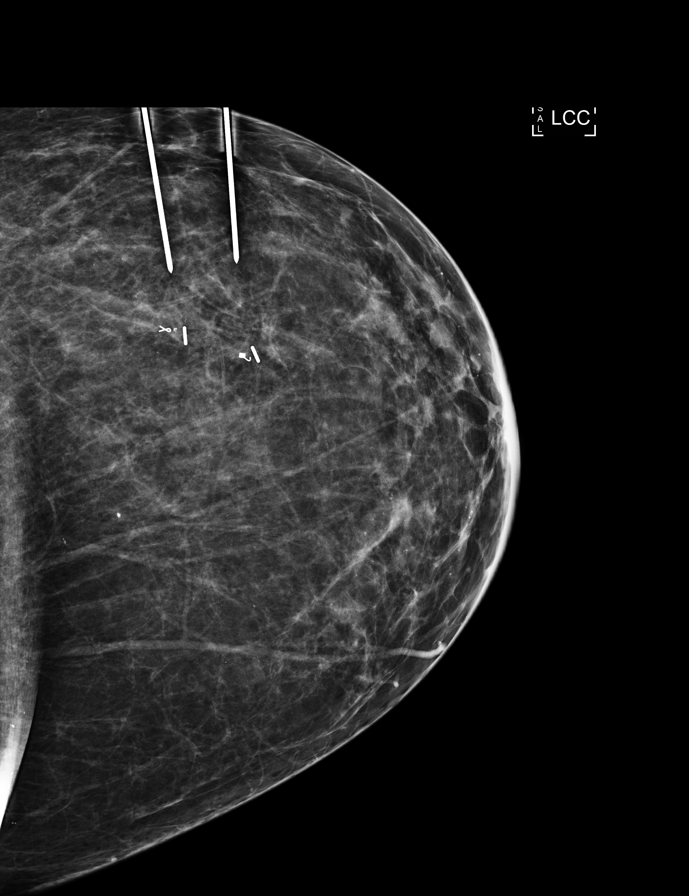

[L CC (3 of 5)]
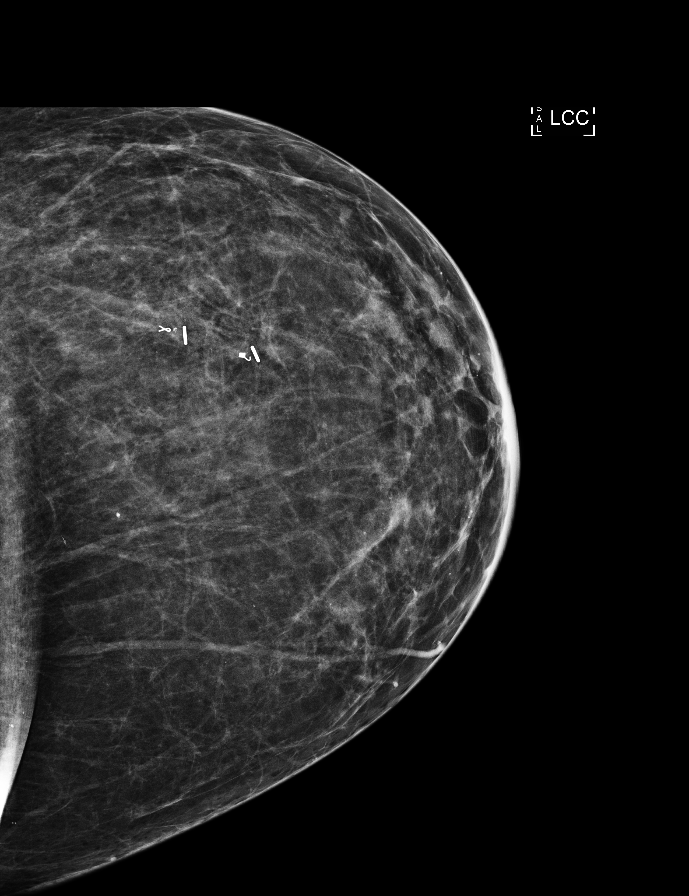

[L CC (4 of 5)]
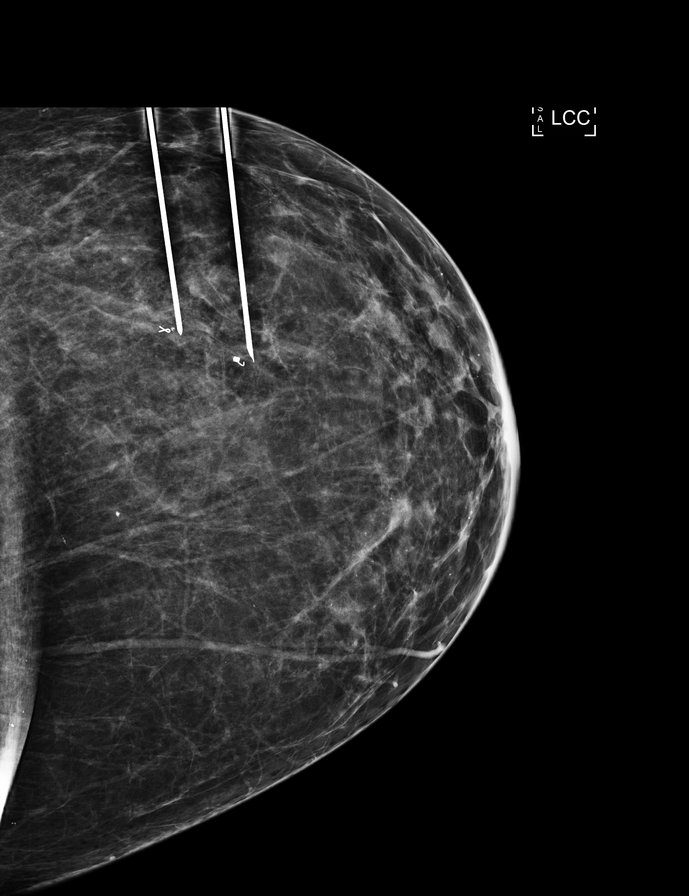

[L CC (5 of 5)]
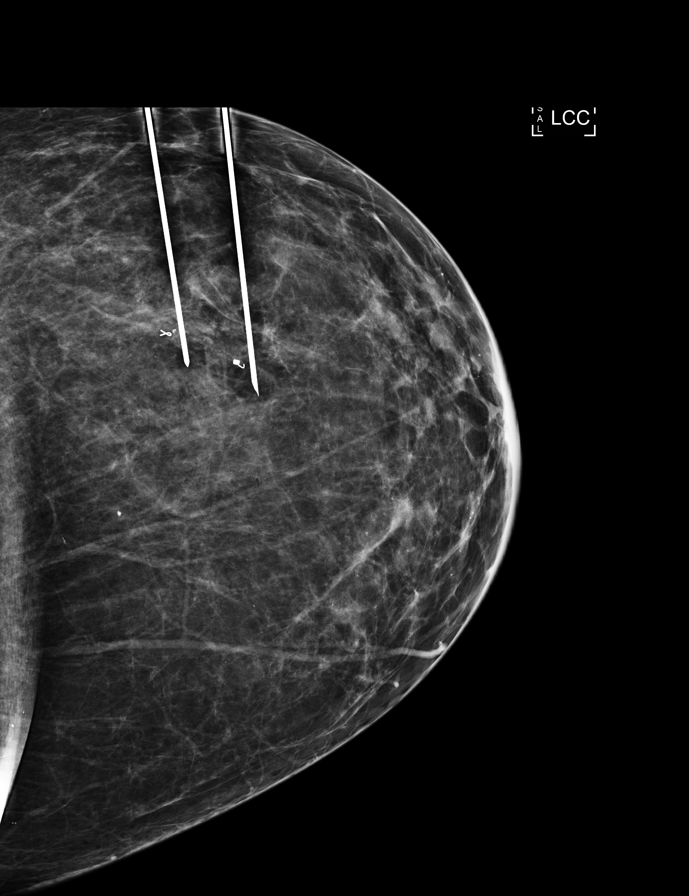

[8 of 9 positions shown; findings below may reference images not displayed]

FINDINGS: Patient presents for radioactive seed localization prior to left
breast lumpectomy. I met with the patient and we discussed the
procedure of seed localization including benefits and alternatives.
We discussed the high likelihood of a successful procedure. We
discussed the risks of the procedure including infection, bleeding,
tissue injury and further surgery. We discussed the low dose of
radioactivity involved in the procedure. Informed, written consent
was given.

The usual time-out protocol was performed immediately prior to the
procedure.

Site 1: Ribbon shaped clip

Using mammographic guidance, sterile technique, 1% lidocaine and an
[MI] radioactive seed, ribbon shaped clip was localized using a
lateral approach. The follow-up mammogram images confirm the seed in
the expected location and were marked for Dr. JIN.

Follow-up survey of the patient confirms presence of the radioactive
seed.

Order number of [MI] seed:  [PHONE_NUMBER].

Total activity:  0.256 millicuries reference Date: [DATE]

Site 2: Coil clip

Using mammographic guidance, sterile technique, 1% lidocaine and an
[MI] radioactive seed, coil clip was localized using a lateral
approach. The follow-up mammogram images confirm the seed in the
expected location and were marked for Dr. JIN.

Follow-up survey of the patient confirms presence of the radioactive
seed.

Order number of [MI] seed:  [PHONE_NUMBER].

Total activity:  0.256 millicuries reference Date: [DATE]

The patient tolerated the procedure well and was released from the
[REDACTED]. She was given instructions regarding seed removal.
IMPRESSION: Radioactive seed localization left breast. No apparent
complications.

## 2021-03-11 IMAGING — MG MM BREAST LOCALIZATION CLIP
2 series · 3 of 6 positions shown · non-contrast
Comparison: Previous exam(s).

CLINICAL DATA: Patient status post ultrasound-guided seed placement
for left axillary lymph node. Radioactive seeds were placed in the
left breast prior using mammogram guidance.

EXAM:
DIAGNOSTIC LEFT MAMMOGRAM POST ULTRASOUND-GUIDED RADIOACTIVE SEED

[L MLO synth-2D]
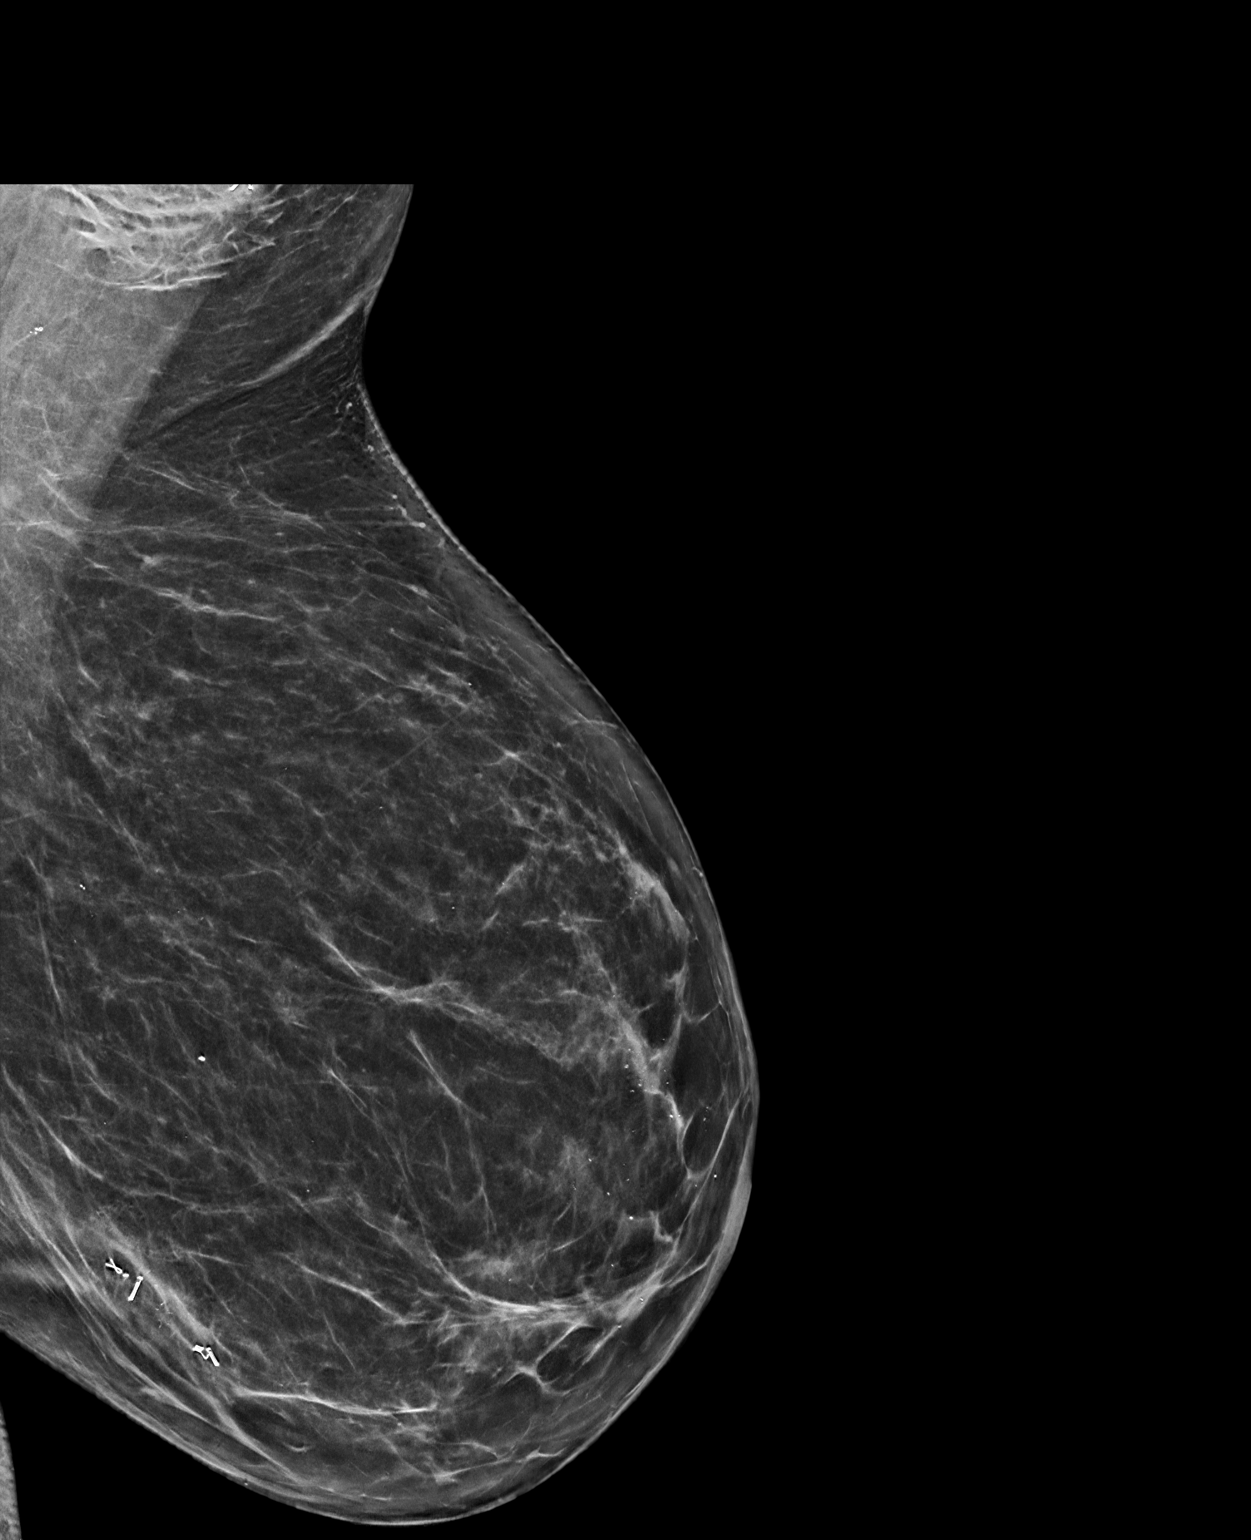

[L MLO tomo · 2 of 76 frames shown]
[frame 25/76]
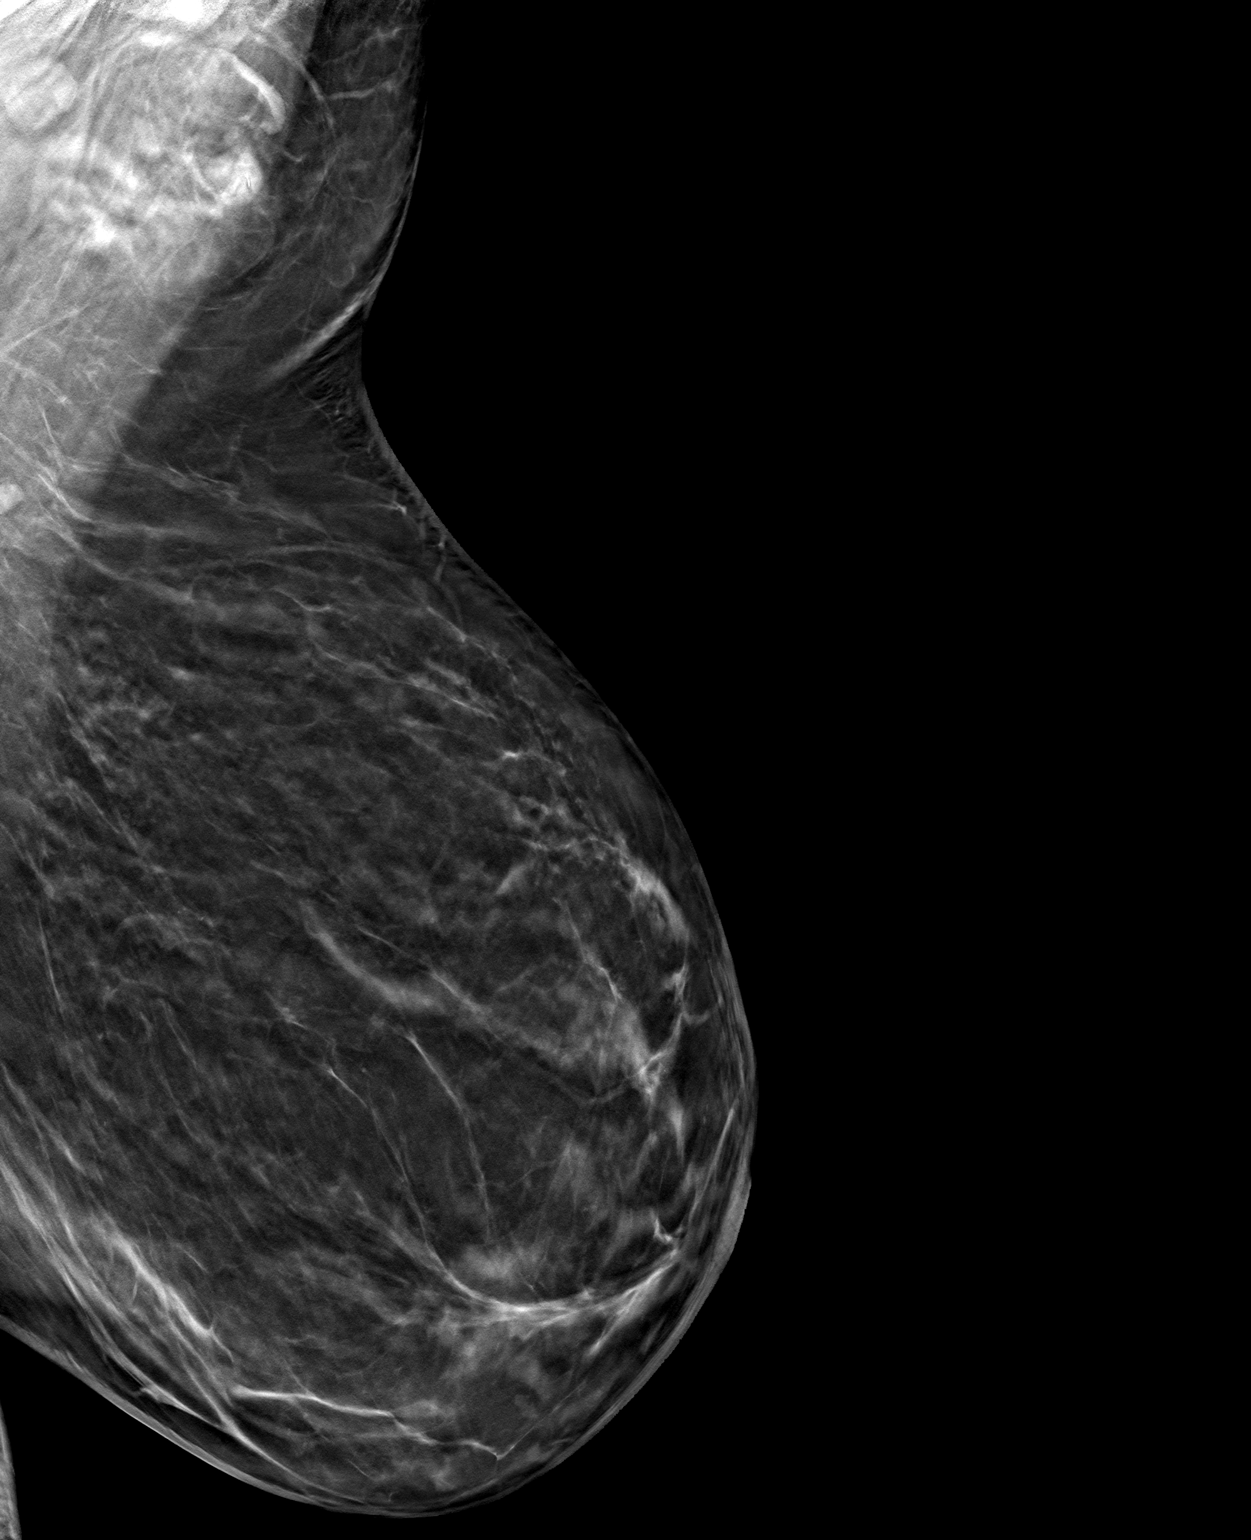
[frame 39/76]
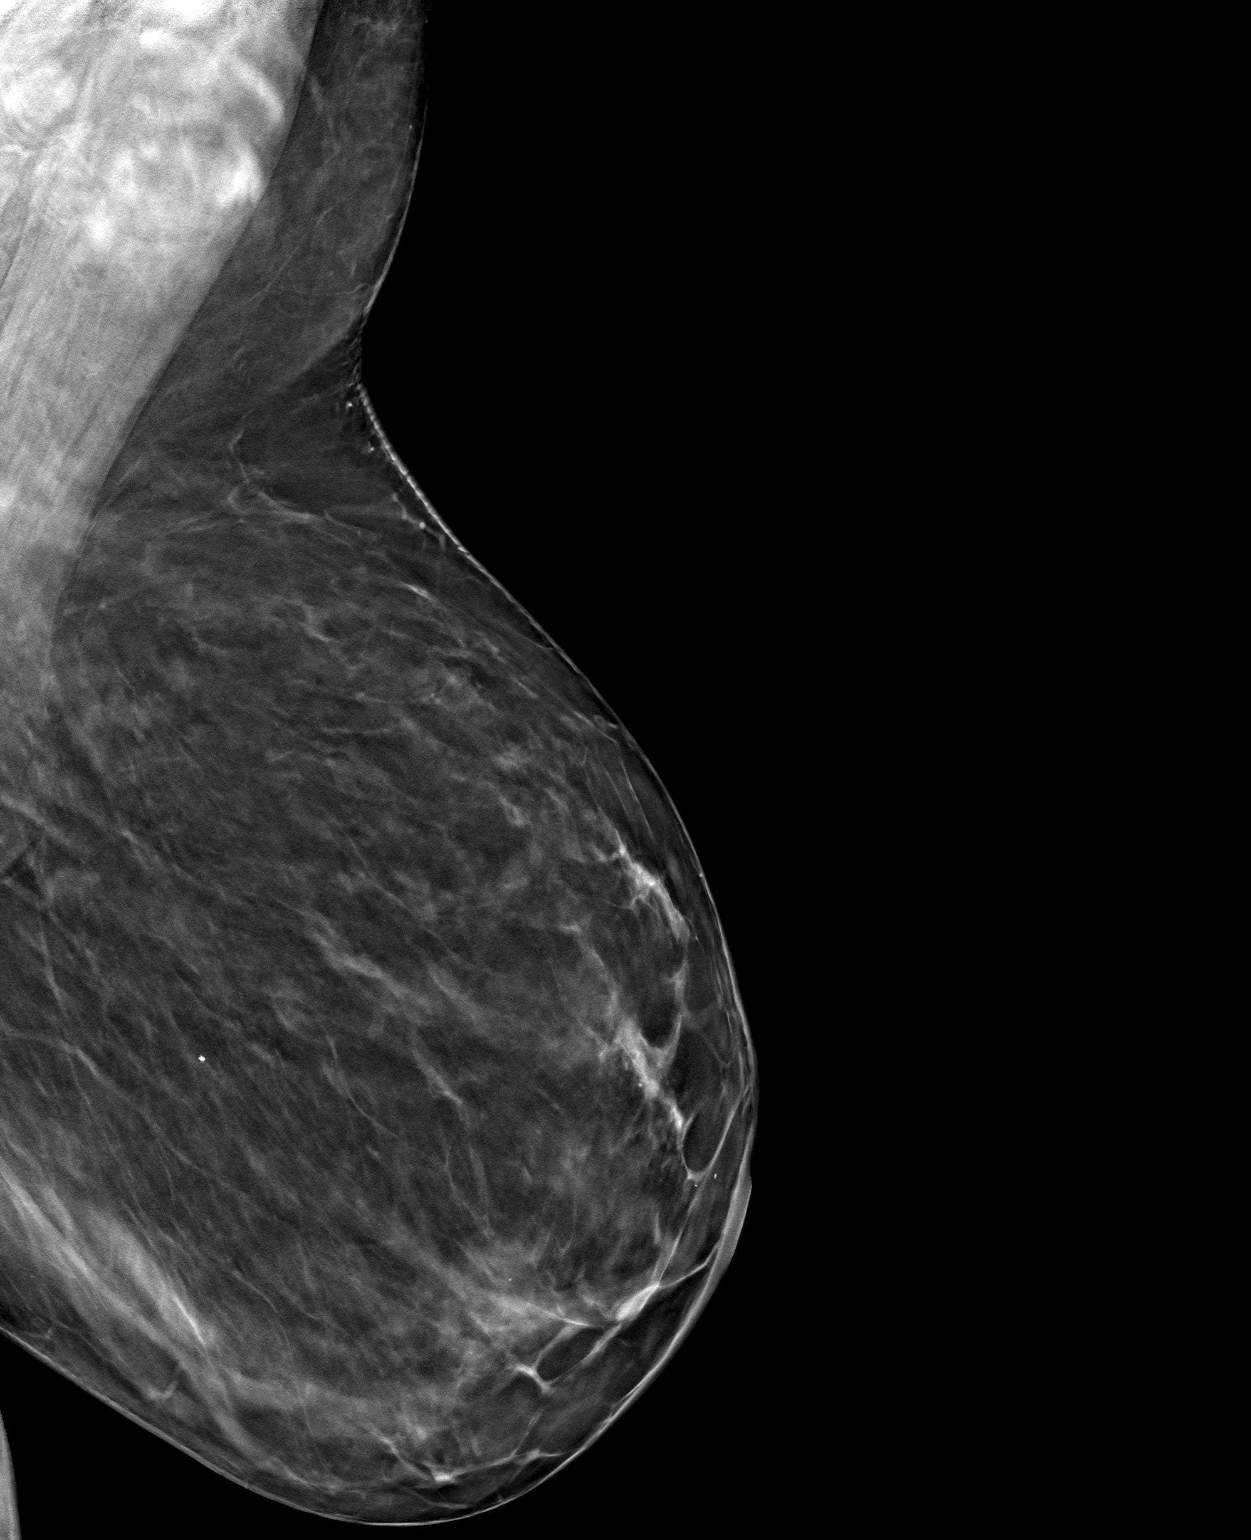

[3 of 6 positions shown; findings below may reference images not displayed]

FINDINGS: Mammographic images were obtained following ultrasound-guided
radioactive seed placement. These demonstrate the radioactive seed
to be located within the left axillary lymph node. The additional
radioactive seeds within the left breast are located in appropriate
position adjacent to the coil shaped and ribbon shaped marking
clips.
IMPRESSION: Appropriate location of the radioactive seeds.

Final Assessment: Post Procedure Mammograms for Seed Placement

## 2021-03-11 NOTE — Progress Notes (Signed)

## 2021-03-12 ENCOUNTER — Ambulatory Visit (HOSPITAL_BASED_OUTPATIENT_CLINIC_OR_DEPARTMENT_OTHER)
Admission: RE | Admit: 2021-03-12 | Discharge: 2021-03-12 | Disposition: A | Discharge status: Home / Self Care | Payer: 59 | Attending: Surgery | Admitting: Surgery

## 2021-03-12 ENCOUNTER — Other Ambulatory Visit: Payer: Self-pay

## 2021-03-12 ENCOUNTER — Ambulatory Visit (HOSPITAL_BASED_OUTPATIENT_CLINIC_OR_DEPARTMENT_OTHER): Payer: 59 | Admitting: Anesthesiology

## 2021-03-12 ENCOUNTER — Encounter (HOSPITAL_BASED_OUTPATIENT_CLINIC_OR_DEPARTMENT_OTHER)
Admission: RE | Disposition: A | Discharge status: Home / Self Care | Payer: Self-pay | Source: Home / Self Care | Attending: Surgery

## 2021-03-12 ENCOUNTER — Ambulatory Visit
Admission: RE | Admit: 2021-03-12 | Discharge: 2021-03-12 | Disposition: A | Discharge status: Home / Self Care | Payer: 59 | Source: Ambulatory Visit | Attending: Surgery | Admitting: Surgery

## 2021-03-12 ENCOUNTER — Encounter (HOSPITAL_COMMUNITY)
Admission: RE | Admit: 2021-03-12 | Discharge: 2021-03-12 | Disposition: A | Discharge status: Home / Self Care | Payer: 59 | Source: Ambulatory Visit | Attending: Surgery | Admitting: Surgery

## 2021-03-12 ENCOUNTER — Encounter (HOSPITAL_BASED_OUTPATIENT_CLINIC_OR_DEPARTMENT_OTHER): Payer: Self-pay | Admitting: Surgery

## 2021-03-12 DIAGNOSIS — C50512 Malignant neoplasm of lower-outer quadrant of left female breast: Secondary | ICD-10-CM | POA: Insufficient documentation

## 2021-03-12 DIAGNOSIS — C773 Secondary and unspecified malignant neoplasm of axilla and upper limb lymph nodes: Secondary | ICD-10-CM | POA: Diagnosis not present

## 2021-03-12 DIAGNOSIS — C50912 Malignant neoplasm of unspecified site of left female breast: Secondary | ICD-10-CM

## 2021-03-12 DIAGNOSIS — Z170 Estrogen receptor positive status [ER+]: Secondary | ICD-10-CM | POA: Diagnosis not present

## 2021-03-12 DIAGNOSIS — I10 Essential (primary) hypertension: Secondary | ICD-10-CM | POA: Insufficient documentation

## 2021-03-12 DIAGNOSIS — C50919 Malignant neoplasm of unspecified site of unspecified female breast: Secondary | ICD-10-CM

## 2021-03-12 DIAGNOSIS — Z79899 Other long term (current) drug therapy: Secondary | ICD-10-CM | POA: Diagnosis not present

## 2021-03-12 DIAGNOSIS — Z803 Family history of malignant neoplasm of breast: Secondary | ICD-10-CM | POA: Diagnosis not present

## 2021-03-12 HISTORY — PX: BREAST LUMPECTOMY: SHX2

## 2021-03-12 HISTORY — DX: Malignant neoplasm of unspecified site of unspecified female breast: C50.919

## 2021-03-12 HISTORY — PX: BREAST LUMPECTOMY WITH RADIOACTIVE SEED AND SENTINEL LYMPH NODE BIOPSY: SHX6550

## 2021-03-12 HISTORY — PX: RADIOACTIVE SEED GUIDED AXILLARY SENTINEL LYMPH NODE: SHX6735

## 2021-03-12 LAB — POCT PREGNANCY, URINE: Preg Test, Ur: NEGATIVE

## 2021-03-12 IMAGING — DX MM BREAST SURGICAL SPECIMEN
1 series · 2 of 2 positions shown · non-contrast
Comparison: Previous exam(s).

CLINICAL DATA: Evaluate surgical specimen following LEFT lumpectomy
and LEFT axillary lymph node excision.

EXAM:
SPECIMEN RADIOGRAPH OF THE LEFT BREAST
SPECIMEN RADIOGRAPH OF THE LEFT AXILLA

[Series 1: specimen digital x-ray · left · 0.07mm/px · 2 of 2 slices shown]
[im 1/2]
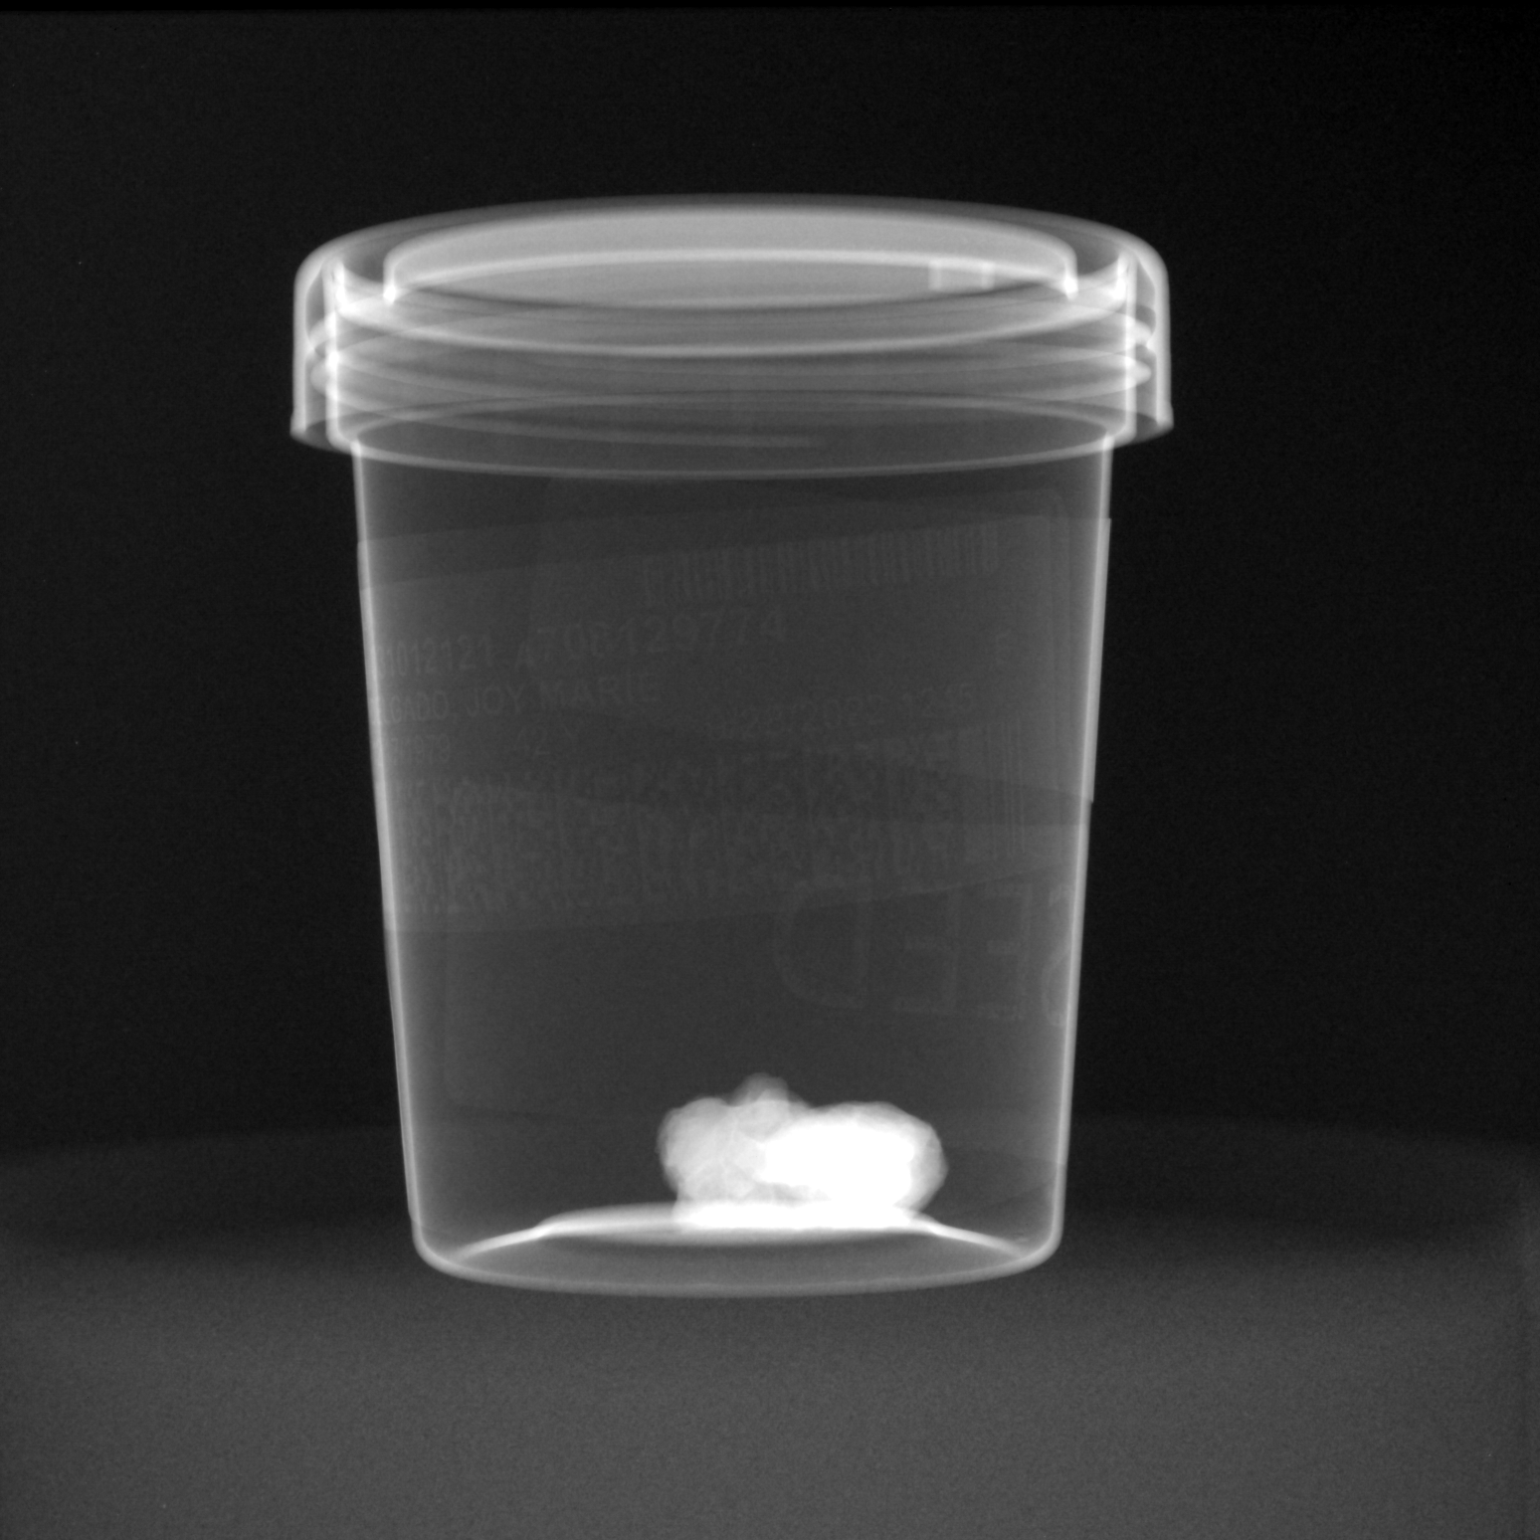
[im 2/2]
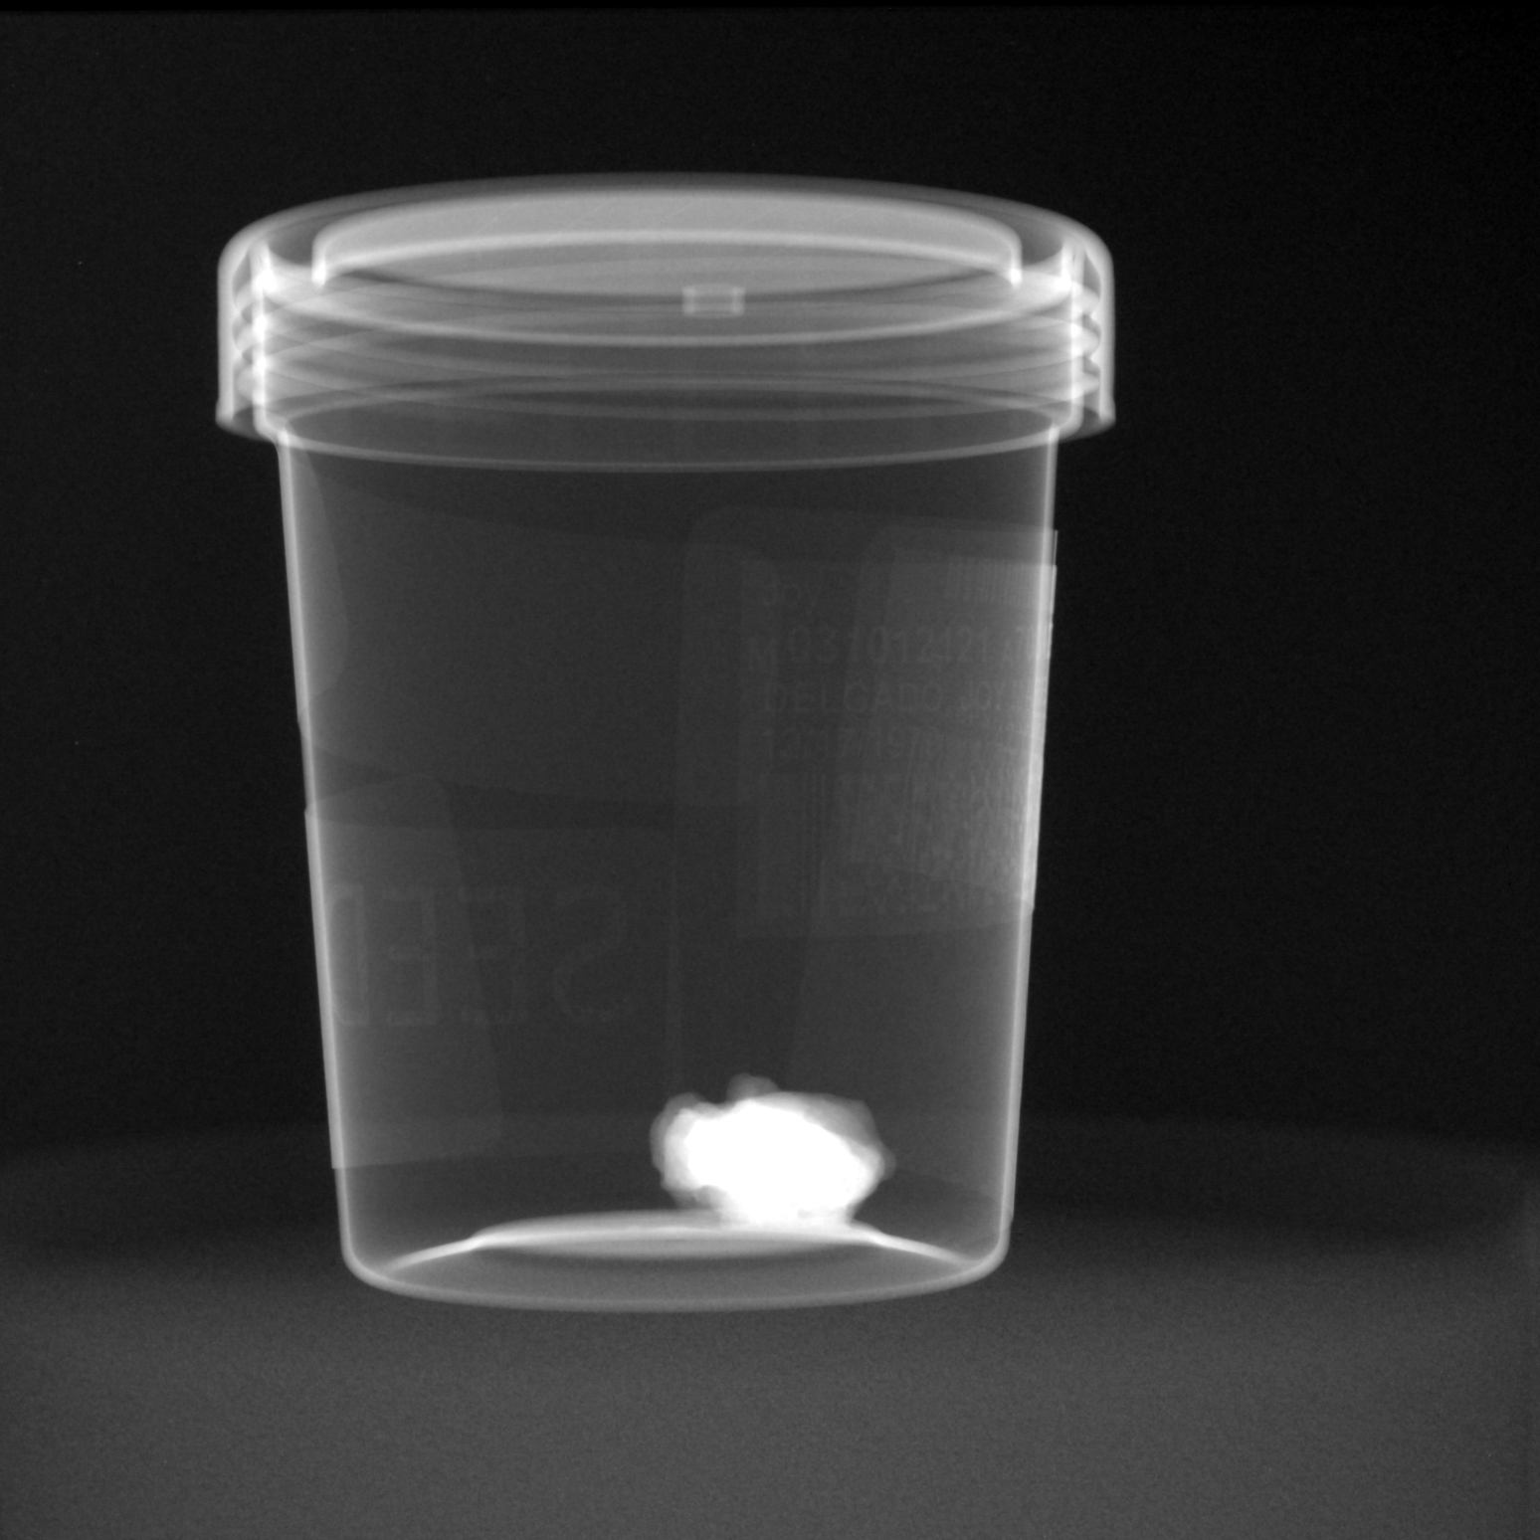

[2 of 2 positions shown; findings below may reference images not displayed]

FINDINGS: Status post excision of the LEFT breast and axilla.

The 2 radioactive seeds, COIL biopsy clip and RIBBON biopsy clip are
present within the surgical specimen, completely intact, and were
marked for pathology.

The radioactive seed and TRIBELL biopsy clip are present within the
surgical specimen in the container.
IMPRESSION: Specimen radiograph of the LEFT breast and LEFT axilla.

## 2021-03-12 IMAGING — DX MM BREAST SURGICAL SPECIMEN
1 series · 2 of 2 positions shown · non-contrast
Comparison: Previous exam(s).

CLINICAL DATA: Evaluate surgical specimen following LEFT lumpectomy
and LEFT axillary lymph node excision.

EXAM:
SPECIMEN RADIOGRAPH OF THE LEFT BREAST
SPECIMEN RADIOGRAPH OF THE LEFT AXILLA

[Series 1: specimen digital x-ray · left · 0.07mm/px · 2 of 2 slices shown]
[im 1/2]
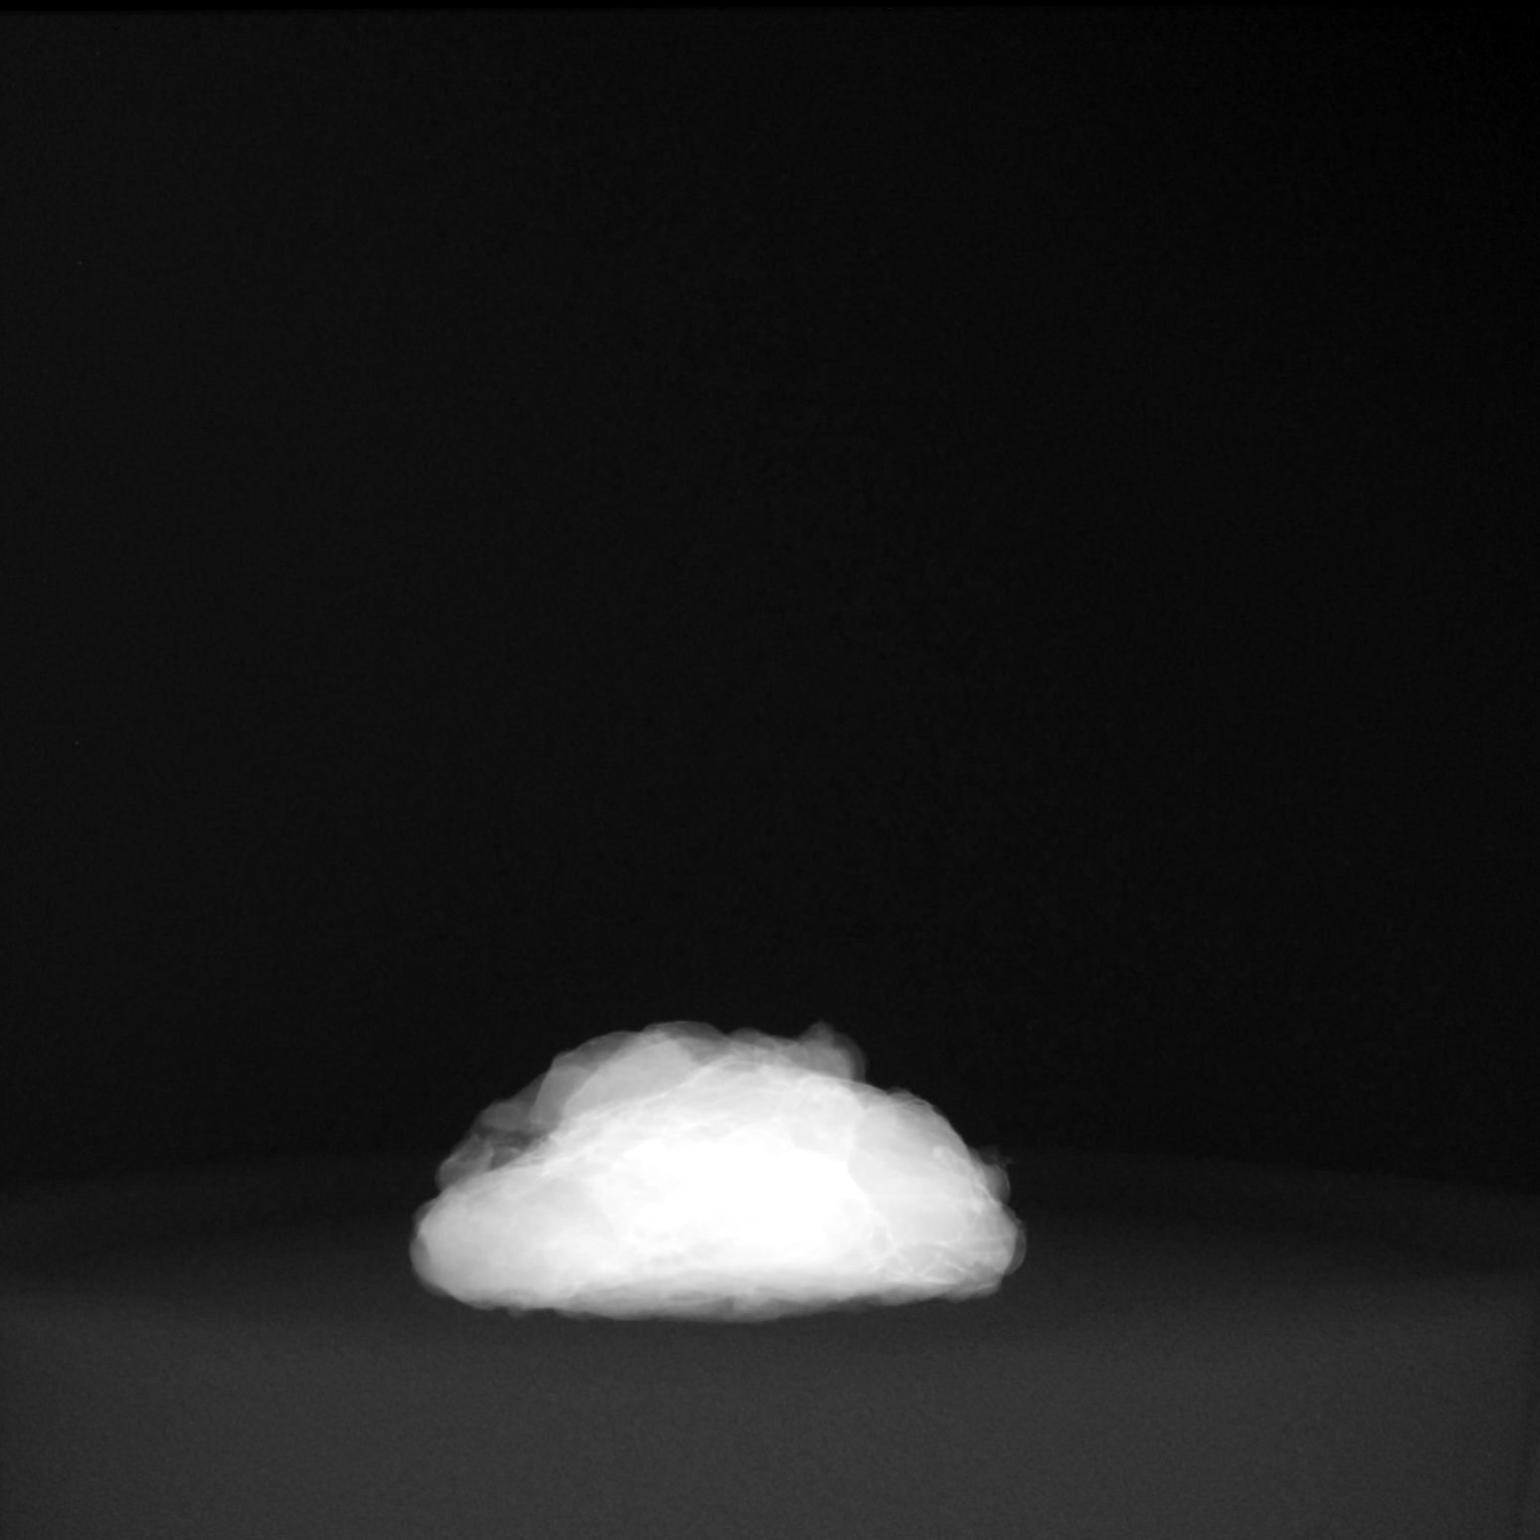
[im 2/2]
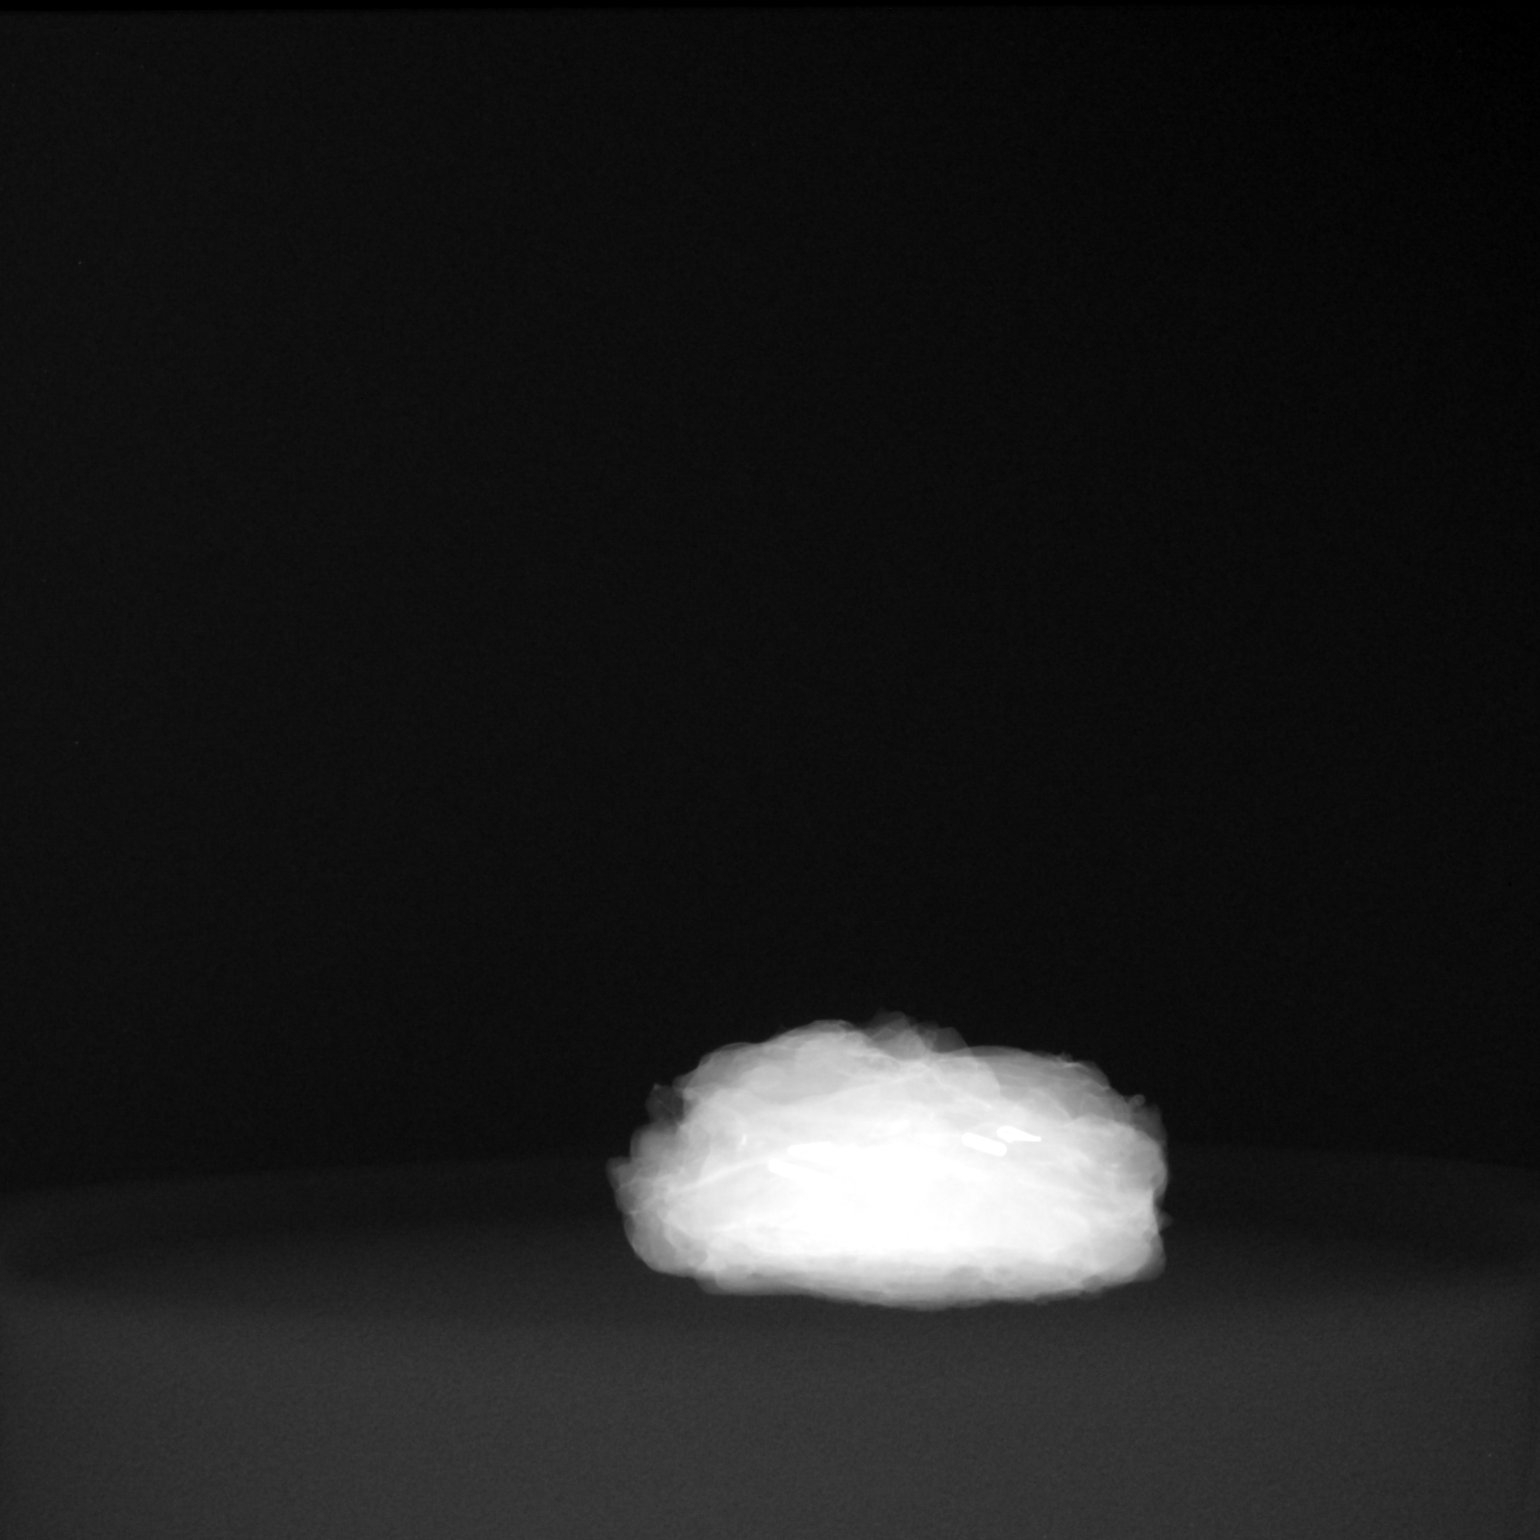

[2 of 2 positions shown; findings below may reference images not displayed]

FINDINGS: Status post excision of the LEFT breast and axilla.

The 2 radioactive seeds, COIL biopsy clip and RIBBON biopsy clip are
present within the surgical specimen, completely intact, and were
marked for pathology.

The radioactive seed and TRIBELL biopsy clip are present within the
surgical specimen in the container.
IMPRESSION: Specimen radiograph of the LEFT breast and LEFT axilla.

## 2021-03-12 SURGERY — BREAST LUMPECTOMY WITH RADIOACTIVE SEED AND SENTINEL LYMPH NODE BIOPSY
Anesthesia: General | Site: Breast | Laterality: Left

## 2021-03-12 MED ORDER — PHENYLEPHRINE 40 MCG/ML (10ML) SYRINGE FOR IV PUSH (FOR BLOOD PRESSURE SUPPORT)
PREFILLED_SYRINGE | INTRAVENOUS | Status: AC
  Filled 2021-03-12: qty 10

## 2021-03-12 MED ORDER — FENTANYL CITRATE (PF) 100 MCG/2ML IJ SOLN
INTRAMUSCULAR | Status: AC
  Filled 2021-03-12: qty 2

## 2021-03-12 MED ORDER — CEFAZOLIN SODIUM-DEXTROSE 2-3 GM-%(50ML) IV SOLR
INTRAVENOUS | Status: DC | PRN
  Administered 2021-03-12: 2 g via INTRAVENOUS

## 2021-03-12 MED ORDER — ONDANSETRON HCL 4 MG/2ML IJ SOLN
INTRAMUSCULAR | Status: AC
  Filled 2021-03-12: qty 2

## 2021-03-12 MED ORDER — FENTANYL CITRATE (PF) 100 MCG/2ML IJ SOLN
INTRAMUSCULAR | Status: DC | PRN
  Administered 2021-03-12: 25 ug via INTRAVENOUS

## 2021-03-12 MED ORDER — MIDAZOLAM HCL 2 MG/2ML IJ SOLN
1.0000 mg | Freq: Once | INTRAMUSCULAR | Status: AC
  Administered 2021-03-12: 1 mg via INTRAVENOUS

## 2021-03-12 MED ORDER — MIDAZOLAM HCL 2 MG/2ML IJ SOLN
INTRAMUSCULAR | Status: AC
  Filled 2021-03-12: qty 2

## 2021-03-12 MED ORDER — PROPOFOL 10 MG/ML IV BOLUS
INTRAVENOUS | Status: DC | PRN
  Administered 2021-03-12: 200 mg via INTRAVENOUS

## 2021-03-12 MED ORDER — FENTANYL CITRATE (PF) 100 MCG/2ML IJ SOLN: 25.0000 ug | INTRAMUSCULAR | Status: DC | PRN

## 2021-03-12 MED ORDER — KETOROLAC TROMETHAMINE 30 MG/ML IJ SOLN
INTRAMUSCULAR | Status: DC | PRN
  Administered 2021-03-12: 30 mg via INTRAVENOUS

## 2021-03-12 MED ORDER — SUCCINYLCHOLINE CHLORIDE 200 MG/10ML IV SOSY
PREFILLED_SYRINGE | INTRAVENOUS | Status: AC
  Filled 2021-03-12: qty 10

## 2021-03-12 MED ORDER — ACETAMINOPHEN 500 MG PO TABS
1000.0000 mg | ORAL_TABLET | ORAL | Status: AC
  Administered 2021-03-12: 1000 mg via ORAL

## 2021-03-12 MED ORDER — ACETAMINOPHEN 500 MG PO TABS
ORAL_TABLET | ORAL | Status: AC
  Filled 2021-03-12: qty 2

## 2021-03-12 MED ORDER — TECHNETIUM TC 99M TILMANOCEPT KIT
1.0000 | PACK | Freq: Once | INTRAVENOUS | Status: AC | PRN
  Administered 2021-03-12: 1 via INTRADERMAL

## 2021-03-12 MED ORDER — CHLORHEXIDINE GLUCONATE CLOTH 2 % EX PADS: 6.0000 | MEDICATED_PAD | Freq: Once | CUTANEOUS | Status: DC

## 2021-03-12 MED ORDER — PHENYLEPHRINE 40 MCG/ML (10ML) SYRINGE FOR IV PUSH (FOR BLOOD PRESSURE SUPPORT)
PREFILLED_SYRINGE | INTRAVENOUS | Status: DC | PRN
  Administered 2021-03-12: 80 ug via INTRAVENOUS

## 2021-03-12 MED ORDER — PROPOFOL 10 MG/ML IV BOLUS
INTRAVENOUS | Status: AC
  Filled 2021-03-12: qty 20

## 2021-03-12 MED ORDER — ONDANSETRON HCL 4 MG/2ML IJ SOLN
INTRAMUSCULAR | Status: DC | PRN
  Administered 2021-03-12 (×2): 4 mg via INTRAVENOUS

## 2021-03-12 MED ORDER — AMISULPRIDE (ANTIEMETIC) 5 MG/2ML IV SOLN: 10.0000 mg | Freq: Once | INTRAVENOUS | Status: DC | PRN

## 2021-03-12 MED ORDER — LACTATED RINGERS IV SOLN: INTRAVENOUS | Status: DC

## 2021-03-12 MED ORDER — FENTANYL CITRATE (PF) 100 MCG/2ML IJ SOLN
50.0000 ug | Freq: Once | INTRAMUSCULAR | Status: AC
  Administered 2021-03-12: 50 ug via INTRAVENOUS

## 2021-03-12 MED ORDER — CEFAZOLIN SODIUM-DEXTROSE 2-4 GM/100ML-% IV SOLN
INTRAVENOUS | Status: AC
  Filled 2021-03-12: qty 100

## 2021-03-12 MED ORDER — MAGTRACE LYMPHATIC TRACER
INTRAMUSCULAR | Status: DC | PRN
  Administered 2021-03-12: 2 mL via INTRAMUSCULAR

## 2021-03-12 MED ORDER — DIPHENHYDRAMINE HCL 50 MG/ML IJ SOLN
INTRAMUSCULAR | Status: AC
  Filled 2021-03-12: qty 1

## 2021-03-12 MED ORDER — DEXAMETHASONE SODIUM PHOSPHATE 4 MG/ML IJ SOLN
INTRAMUSCULAR | Status: DC | PRN
  Administered 2021-03-12: 10 mg via INTRAVENOUS

## 2021-03-12 MED ORDER — EPHEDRINE 5 MG/ML INJ
INTRAVENOUS | Status: AC
  Filled 2021-03-12: qty 5

## 2021-03-12 MED ORDER — DEXAMETHASONE SODIUM PHOSPHATE 10 MG/ML IJ SOLN
INTRAMUSCULAR | Status: AC
  Filled 2021-03-12: qty 1

## 2021-03-12 MED ORDER — BUPIVACAINE-EPINEPHRINE (PF) 0.25% -1:200000 IJ SOLN
INTRAMUSCULAR | Status: DC | PRN
  Administered 2021-03-12: 30 mL

## 2021-03-12 MED ORDER — HYDROCODONE-ACETAMINOPHEN 5-325 MG PO TABS: 1.0000 | ORAL_TABLET | Freq: Four times a day (QID) | ORAL | 0 refills | Status: DC | PRN

## 2021-03-12 MED ORDER — CEFAZOLIN SODIUM-DEXTROSE 2-4 GM/100ML-% IV SOLN: 2.0000 g | INTRAVENOUS | Status: DC

## 2021-03-12 MED ORDER — MIDAZOLAM HCL 5 MG/5ML IJ SOLN
INTRAMUSCULAR | Status: DC | PRN
  Administered 2021-03-12: 1 mg via INTRAVENOUS

## 2021-03-12 MED ORDER — DIPHENHYDRAMINE HCL 50 MG/ML IJ SOLN
INTRAMUSCULAR | Status: DC | PRN
  Administered 2021-03-12: 6.25 mg via INTRAVENOUS

## 2021-03-12 MED ORDER — KETOROLAC TROMETHAMINE 30 MG/ML IJ SOLN
INTRAMUSCULAR | Status: AC
  Filled 2021-03-12: qty 1

## 2021-03-12 SURGICAL SUPPLY — 53 items
APPLIER CLIP 9.375 MED OPEN (MISCELLANEOUS) ×2
BENZOIN TINCTURE PRP APPL 2/3 (GAUZE/BANDAGES/DRESSINGS) ×2 IMPLANT
BINDER BREAST 3XL (GAUZE/BANDAGES/DRESSINGS) IMPLANT
BINDER BREAST LRG (GAUZE/BANDAGES/DRESSINGS) IMPLANT
BINDER BREAST MEDIUM (GAUZE/BANDAGES/DRESSINGS) IMPLANT
BINDER BREAST XLRG (GAUZE/BANDAGES/DRESSINGS) IMPLANT
BINDER BREAST XXLRG (GAUZE/BANDAGES/DRESSINGS) IMPLANT
BLADE CLIPPER SURG (BLADE) IMPLANT
BLADE HEX COATED 2.75 (ELECTRODE) ×2 IMPLANT
BLADE SURG 10 STRL SS (BLADE) ×2 IMPLANT
BLADE SURG 15 STRL LF DISP TIS (BLADE) ×1 IMPLANT
BLADE SURG 15 STRL SS (BLADE) ×1
CANISTER SUC SOCK COL 7IN (MISCELLANEOUS) ×2 IMPLANT
CANISTER SUCT 1200ML W/VALVE (MISCELLANEOUS) ×2 IMPLANT
CHLORAPREP W/TINT 26 (MISCELLANEOUS) ×2 IMPLANT
CLIP APPLIE 9.375 MED OPEN (MISCELLANEOUS) ×1 IMPLANT
COVER BACK TABLE 60X90IN (DRAPES) ×2 IMPLANT
COVER MAYO STAND STRL (DRAPES) ×2 IMPLANT
COVER PROBE W GEL 5X96 (DRAPES) ×4 IMPLANT
DECANTER SPIKE VIAL GLASS SM (MISCELLANEOUS) IMPLANT
DRAPE LAPAROSCOPIC ABDOMINAL (DRAPES) ×2 IMPLANT
DRAPE UTILITY XL STRL (DRAPES) ×2 IMPLANT
DRSG TEGADERM 4X4.75 (GAUZE/BANDAGES/DRESSINGS) ×4 IMPLANT
ELECT REM PT RETURN 9FT ADLT (ELECTROSURGICAL) ×2
ELECTRODE REM PT RTRN 9FT ADLT (ELECTROSURGICAL) ×1 IMPLANT
GAUZE SPONGE 4X4 12PLY STRL LF (GAUZE/BANDAGES/DRESSINGS) IMPLANT
GLOVE SURG ENC MOIS LTX SZ7 (GLOVE) ×2 IMPLANT
GLOVE SURG UNDER POLY LF SZ7.5 (GLOVE) ×2 IMPLANT
GOWN STRL REUS W/ TWL LRG LVL3 (GOWN DISPOSABLE) ×2 IMPLANT
GOWN STRL REUS W/TWL LRG LVL3 (GOWN DISPOSABLE) ×2
ILLUMINATOR WAVEGUIDE N/F (MISCELLANEOUS) IMPLANT
KIT MARKER MARGIN INK (KITS) ×2 IMPLANT
LIGHT WAVEGUIDE WIDE FLAT (MISCELLANEOUS) IMPLANT
NDL SAFETY ECLIPSE 18X1.5 (NEEDLE) ×1 IMPLANT
NEEDLE HYPO 18GX1.5 SHARP (NEEDLE) ×1
NEEDLE HYPO 25X1 1.5 SAFETY (NEEDLE) ×4 IMPLANT
NS IRRIG 1000ML POUR BTL (IV SOLUTION) IMPLANT
PACK BASIN DAY SURGERY FS (CUSTOM PROCEDURE TRAY) ×2 IMPLANT
PENCIL SMOKE EVACUATOR (MISCELLANEOUS) ×2 IMPLANT
SLEEVE SCD COMPRESS KNEE MED (STOCKING) ×2 IMPLANT
SPONGE T-LAP 18X18 ~~LOC~~+RFID (SPONGE) IMPLANT
SPONGE T-LAP 4X18 ~~LOC~~+RFID (SPONGE) ×2 IMPLANT
STRIP CLOSURE SKIN 1/2X4 (GAUZE/BANDAGES/DRESSINGS) ×2 IMPLANT
SUT MON AB 4-0 PC3 18 (SUTURE) ×2 IMPLANT
SUT SILK 2 0 SH (SUTURE) IMPLANT
SUT VIC AB 3-0 SH 27 (SUTURE) ×1
SUT VIC AB 3-0 SH 27X BRD (SUTURE) ×1 IMPLANT
SYR BULB EAR ULCER 3OZ GRN STR (SYRINGE) ×2 IMPLANT
SYR CONTROL 10ML LL (SYRINGE) ×4 IMPLANT
TOWEL GREEN STERILE FF (TOWEL DISPOSABLE) ×2 IMPLANT
TRAY FAXITRON CT DISP (TRAY / TRAY PROCEDURE) ×2 IMPLANT
TUBE CONNECTING 20X1/4 (TUBING) ×2 IMPLANT
YANKAUER SUCT BULB TIP NO VENT (SUCTIONS) IMPLANT

## 2021-03-12 NOTE — Discharge Instructions (Addendum)
[facility] Office Phone Number [phone]  BREAST BIOPSY/ PARTIAL MASTECTOMY: POST OP INSTRUCTIONS  *You had 1000 mg of Tylenol at 11:30 am *No Ibuprofen/motrin/advil until after 8:30 pm  Always review your discharge instruction sheet given to you by the facility where your surgery was performed.  IF YOU HAVE DISABILITY OR FAMILY LEAVE FORMS, YOU MUST BRING THEM TO THE OFFICE FOR PROCESSING.  DO NOT GIVE THEM TO YOUR DOCTOR.  A prescription for pain medication may be given to you upon discharge.  Take your pain medication as prescribed, if needed.  If narcotic pain medicine is not needed, then you may take acetaminophen (Tylenol) or ibuprofen (Advil) as needed. Take your usually prescribed medications unless otherwise directed If you need a refill on your pain medication, please contact your pharmacy.  They will contact our office to request authorization.  Prescriptions will not be filled after 5pm or on week-ends. You should eat very light the first 24 hours after surgery, such as soup, crackers, pudding, etc.  Resume your normal diet the day after surgery. Most patients will experience some swelling and bruising in the breast.  Ice packs and a good support bra will help.  Swelling and bruising can take several days to resolve.  It is common to experience some constipation if taking pain medication after surgery.  Increasing fluid intake and taking a stool softener will usually help or prevent this problem from occurring.  A mild laxative (Milk of Magnesia or Miralax) should be taken according to package directions if there are no bowel movements after 48 hours. Unless discharge instructions indicate otherwise, you may remove your bandages 24-48 hours after surgery, and you may shower at that time.  You may have steri-strips (small skin tapes) in place directly over the incision.  These strips should be left on the skin for 7-10 days.  If your surgeon used skin glue on the  incision, you may shower in 24 hours.  The glue will flake off over the next 2-3 weeks.  Any sutures or staples will be removed at the office during your follow-up visit. ACTIVITIES:  You may resume regular daily activities (gradually increasing) beginning the next day.  Wearing a good support bra or sports bra minimizes pain and swelling.  You may have sexual intercourse when it is comfortable. You may drive when you no longer are taking prescription pain medication, you can comfortably wear a seatbelt, and you can safely maneuver your car and apply brakes. RETURN TO WORK:  ______________________________________________________________________________________ Dennis Bast should see your doctor in the office for a follow-up appointment approximately two weeks after your surgery.  Your doctor's nurse will typically make your follow-up appointment when she calls you with your pathology report.  Expect your pathology report 2-3 business days after your surgery.  You may call to check if you do not hear from [place] after three days. OTHER INSTRUCTIONS: _______________________________________________________________________________________________ _____________________________________________________________________________________________________________________________________ _____________________________________________________________________________________________________________________________________ _____________________________________________________________________________________________________________________________________  WHEN TO CALL YOUR DOCTOR: Fever over 101.0 Nausea and/or vomiting. Extreme swelling or bruising. Continued bleeding from incision. Increased pain, redness, or drainage from the incision.  The clinic staff is available to answer your questions during regular business hours.  Please don't hesitate to call and ask to speak to one of the nurses for clinical concerns.  If you have a  medical emergency, go to the nearest emergency room or call 911.  A surgeon from [facility] is always on call at the hospital.  For further questions, please visit [email or web address]  Post Anesthesia Home Care Instructions  Activity: Get plenty of rest for the remainder of the day. A responsible individual must stay with you for 24 hours following the procedure.  For the next 24 hours, DO NOT: -Drive a car -Paediatric nurse -Drink alcoholic beverages -Take any medication unless instructed by your physician -Make any legal decisions or sign important papers.  Meals: Start with liquid foods such as gelatin or soup. Progress to regular foods as tolerated. Avoid greasy, spicy, heavy foods. If nausea and/or vomiting occur, drink only clear liquids until the nausea and/or vomiting subsides. Call your physician if vomiting continues.  Special Instructions/Symptoms: Your throat may feel dry or sore from the anesthesia or the breathing tube placed in your throat during surgery. If this causes discomfort, gargle with warm salt water. The discomfort should disappear within 24 hours.  If you had a scopolamine patch placed behind your ear for the management of post- operative nausea and/or vomiting:  1. The medication in the patch is effective for 72 hours, after which it should be removed.  Wrap patch in a tissue and discard in the trash. Wash hands thoroughly with soap and water. 2. You may remove the patch earlier than 72 hours if you experience unpleasant side effects which may include dry mouth, dizziness or visual disturbances. 3. Avoid touching the patch. Wash your hands with soap and water after contact with the patch.

## 2021-03-12 NOTE — Anesthesia Procedure Notes (Signed)
Anesthesia Regional Block: Pectoralis block   Pre-Anesthetic Checklist: , timeout performed,  Correct Patient, Correct Site, Correct Laterality,  Correct Procedure, Correct Position, site marked,  Risks and benefits discussed,  Surgical consent,  Pre-op evaluation,  At surgeon's request and post-op pain management  Laterality: Left  Prep: chloraprep       Needles:  Injection technique: Single-shot  Needle Type: Echogenic Needle     Needle Length: 9cm  Needle Gauge: 21     Additional Needles:   Procedures:,,,, ultrasound used (permanent image in chart),,    Narrative:  Start time: 03/12/2021 11:45 AM End time: 03/12/2021 11:52 AM Injection made incrementally with aspirations every 5 mL.  Performed by: Personally  Anesthesiologist: Suzette Battiest, MD

## 2021-03-12 NOTE — Progress Notes (Signed)
Emotional support during breast injections by nuc med tech

## 2021-03-12 NOTE — Op Note (Signed)
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 Resident:  Dr. Drucie Ip, MD  I was personally present during the key and critical portions of this procedure and immediately available throughout the entire procedure, as documented in my operative note.  Anesthesia:  GEN - LMA/ PEC block Indications: This is a 45 year old female with a family history of breast cancer who presented with 2 masses in the lower left breast.  These were biopsied and revealed invasive ductal carcinoma grade 3.  She also had a single positive lymph node in the left axilla.  This was also positive for metastatic cancer.  She underwent neoadjuvant chemotherapy.  Recent MRI showed complete resolution of her primary tumors as well as a normal-appearing axilla.  She presents now for lumpectomy and targeted lymph node dissection with sentinel lymph node biopsy.  Prior to surgery, she was injected with Lymphoseek by the radiology tech.  I also injected MAC trace in the retroareolar space in the preoperative area.  3 radioactive seeds were placed yesterday by radiology.  She has 1 and the previously biopsied axillary lymph node and one in each location of the original primary tumors.   Description of procedure: The patient is brought to the operating room placed in supine position on the operating room table. After an adequate level of general anesthesia was obtained, we injected methylene blue dye solution into the dermis around her nipple.  Her left breast was prepped with ChloraPrep and draped in sterile fashion. A timeout was taken to ensure the proper patient and proper procedure. We interrogated the breast with the neoprobe.  Both seeds are located just above the inframammary crease at  6:00 in the left breast.  We made a transverse incision near the inframammary crease after infiltrating with 0.25% Marcaine. Dissection was carried down in the breast tissue with cautery. We used the neoprobe to guide [place] towards the radioactive seeds. We excised an area of tissue around the radioactive seed 3 cm in diameter. The specimen was removed and was oriented with a paint kit. Specimen mammogram showed the radioactive seeds as well as the biopsy clips within the specimen. This was sent for pathologic examination. There is no residual radioactivity within the biopsy cavity. We inspected carefully for hemostasis. The wound was thoroughly irrigated.   We then turned our attention to the axilla.  We interrogated the axilla.  The radioactivity of the targeted lymph node with the radioactive seed was identified and marked on the skin.  The settings were adjusted on the Neoprobe for the sentinel lymph node biopsy and we interrogated the axilla.  An adjacent area of activity was identified consistent with the sentinel lymph node.  We also used the Sentimag probe to interrogate this area.  This also identified the sentinel lymph node.  I made a transverse incision across the axilla..  We dissected into the axilla used the neoprobe on the radioactive seed settings to find the original targeted lymph node.  This was excised separately and specimen mammogram revealed the clip and radioactive seed within the lymph node.  This was identified as targeted lymph node left axilla.  We changed the settings on the neoprobe back to the sentinel lymph node settings.  We used the Sentimag probe to identify a more superior lymph node.  This had some brownish discoloration from  the mag trace.  We excised this as sentinel lymph node #1.  This also had high radioactivity from the Lymphoseek.  We interrogated the axilla again and a second node was identified with both the magnetic as well as the Lymphoseek techniques..  There seem to  be a small cluster of adjacent lymph nodes.  These were all excised and sent as left axillary contents.  There was minimal background activity with either probe.  Both wounds were then inspected for hemostasis.  The wounds were closed with a deep layer of 3-0 Vicryl and a subcuticular layer of 4-0 Monocryl. Benzoin Steri-Strips were applied. The patient was then extubated and brought to the recovery room in stable condition. All sponge, instrument, and needle counts are correct.  Imogene Burn. Georgette Dover, MD, [facility] Surgery  General/ Trauma Surgery  05/06/2020 1:38 PM

## 2021-03-12 NOTE — Interval H&P Note (Signed)
History and Physical Interval Note:  03/12/2021 11:59 AM  Tammie [name]  has presented today for surgery, with the diagnosis of LEFT BREAST INVASIVE DUCTAL CARCINOMA WITH AXILLARY METASTASES.  The various methods of treatment have been discussed with the patient and family. After consideration of risks, benefits and other options for treatment, the patient has consented to  Procedure(s): LEFT BREAST LUMPECTOMY WITH RADIOACTIVE SEED X2 AND LEFT SENTINEL LYMPH NODE BIOPSY (Left) RADIOACTIVE SEED GUIDED LEFT AXILLARY SENTINEL LYMPH NODE BIOPSY (Left) as a surgical intervention.  The patient's history has been reviewed, patient examined, no change in status, stable for surgery.  I have reviewed the patient's chart and labs.  Questions were answered to the patient's satisfaction.     Maia Petties

## 2021-03-12 NOTE — Transfer of Care (Signed)
Immediate Anesthesia Transfer of Care Note  Patient: Tammie [name]  Procedure(s) Performed: LEFT BREAST LUMPECTOMY WITH RADIOACTIVE SEED X2 AND LEFT SENTINEL LYMPH NODE BIOPSY (Left: Breast) RADIOACTIVE SEED GUIDED LEFT AXILLARY SENTINEL LYMPH NODE BIOPSY (Left: Breast)  Patient Location: PACU  Anesthesia Type:General and Regional  Level of Consciousness: drowsy and patient cooperative  Airway & Oxygen Therapy: Patient Spontanous Breathing and Patient connected to face mask oxygen  Post-op Assessment: Report given to RN and Post -op Vital signs reviewed and stable  Post vital signs: Reviewed and stable  Last Vitals:  Vitals Value Taken Time  BP 120/78 03/12/21 1430  Temp 36.6 C 03/12/21 1429  Pulse 78 03/12/21 1433  Resp 13 03/12/21 1433  SpO2 100 % 03/12/21 1433  Vitals shown include unvalidated device data.  Last Pain:  Vitals:   03/12/21 1429  TempSrc:   PainSc: Asleep         Complications: No notable events documented.

## 2021-03-12 NOTE — Anesthesia Procedure Notes (Addendum)
Procedure Name: LMA Insertion Date/Time: 03/12/2021 12:47 PM Performed by: Gwyndolyn Saxon, CRNA Pre-anesthesia Checklist: Patient identified, Emergency Drugs available, Suction available and Patient being monitored Patient Re-evaluated:Patient Re-evaluated prior to induction Oxygen Delivery Method: Circle system utilized Preoxygenation: Pre-oxygenation with 100% oxygen Induction Type: IV induction Ventilation: Mask ventilation without difficulty LMA: LMA inserted LMA Size: 4.0 Number of attempts: 1 Placement Confirmation: positive ETCO2 and breath sounds checked- equal and bilateral Tube secured with: Tape Dental Injury: Teeth and Oropharynx as per pre-operative assessment

## 2021-03-12 NOTE — Anesthesia Postprocedure Evaluation (Signed)
Anesthesia Post Note  Patient: Tammie [name]  Procedure(s) Performed: LEFT BREAST LUMPECTOMY WITH RADIOACTIVE SEED X2 AND LEFT SENTINEL LYMPH NODE BIOPSY (Left: Breast) RADIOACTIVE SEED GUIDED LEFT AXILLARY SENTINEL LYMPH NODE BIOPSY (Left: Breast)     Patient location during evaluation: PACU Anesthesia Type: General Level of consciousness: awake and alert and oriented Pain management: pain level controlled Vital Signs Assessment: post-procedure vital signs reviewed and stable Respiratory status: spontaneous breathing, nonlabored ventilation and respiratory function stable Cardiovascular status: blood pressure returned to baseline and stable Postop Assessment: no apparent nausea or vomiting Anesthetic complications: no   No notable events documented.  Last Vitals:  Vitals:   03/12/21 1445 03/12/21 1500  BP: 119/72 117/76  Pulse: 79 71  Resp: 16 (!) 9  Temp:    SpO2: 99% 99%    Last Pain:  Vitals:   03/12/21 1500  TempSrc:   PainSc: 0-No pain                 Avaleigh Decuir A.

## 2021-03-12 NOTE — Progress Notes (Signed)
Assisted Dr. Rob Fitzgerald with left, ultrasound guided, pectoralis block. Side rails up, monitors on throughout procedure. See vital signs in flow sheet. Tolerated Procedure well. 

## 2021-03-12 NOTE — Anesthesia Preprocedure Evaluation (Signed)
Anesthesia Evaluation  Patient identified by MRN, date of birth, ID band Patient awake    Reviewed: Allergy & Precautions, NPO status , Patient's Chart, lab work & pertinent test results  History of Anesthesia Complications (+) PONV  Airway Mallampati: II  TM Distance: >3 FB Neck ROM: Full    Dental   Pulmonary neg pulmonary ROS,    breath sounds clear to auscultation       Cardiovascular hypertension, Pt. on medications and Pt. on home beta blockers negative cardio ROS   Rhythm:Regular Rate:Normal     Neuro/Psych  Headaches,  Neuromuscular disease negative psych ROS   GI/Hepatic negative GI ROS, Neg liver ROS,   Endo/Other  negative endocrine ROS  Renal/GU negative Renal ROS  negative genitourinary   Musculoskeletal negative musculoskeletal ROS (+)   Abdominal   Peds negative pediatric ROS (+)  Hematology  (+) anemia ,   Anesthesia Other Findings   Reproductive/Obstetrics negative OB ROS                             Lab Results  Component Value Date   WBC 4.2 02/27/2021   HGB 8.7 (L) 02/27/2021   HCT 25.4 (L) 02/27/2021   MCV 96.6 02/27/2021   PLT 148 (L) 02/27/2021   Lab Results  Component Value Date   CREATININE 0.67 02/27/2021   BUN 13 02/27/2021   NA 140 02/27/2021   K 3.9 02/27/2021   CL 105 02/27/2021   CO2 25 02/27/2021    Anesthesia Physical Anesthesia Plan  ASA: 3  Anesthesia Plan: General   Post-op Pain Management:  Regional for Post-op pain   Induction: Intravenous  PONV Risk Score and Plan: 4 or greater and Midazolam, Scopolamine patch - Pre-op, Dexamethasone, Ondansetron and Treatment may vary due to age or medical condition  Airway Management Planned: LMA  Additional Equipment: None  Intra-op Plan:   Post-operative Plan: Extubation in OR  Informed Consent: I have reviewed the patients History and Physical, chart, labs and discussed the  procedure including the risks, benefits and alternatives for the proposed anesthesia with the patient or authorized representative who has indicated his/her understanding and acceptance.     Dental advisory given  Plan Discussed with: CRNA  Anesthesia Plan Comments:         Anesthesia Quick Evaluation

## 2021-03-13 ENCOUNTER — Encounter (HOSPITAL_BASED_OUTPATIENT_CLINIC_OR_DEPARTMENT_OTHER): Payer: Self-pay | Admitting: Surgery

## 2021-03-14 LAB — SURGICAL PATHOLOGY

## 2021-03-17 ENCOUNTER — Telehealth: Payer: Self-pay | Admitting: Hematology and Oncology

## 2021-03-17 NOTE — Telephone Encounter (Signed)
Scheduled per sch msg. Called and spoke with patient. Confirmed appts  

## 2021-03-18 ENCOUNTER — Encounter: Payer: Self-pay | Admitting: *Deleted

## 2021-03-22 NOTE — Progress Notes (Signed)
Patient Care Team: [name], FNP as PCP - General (Family Medicine) Rockwell Germany, RN as Oncology Nurse Navigator Mauro Kaufmann, RN as Oncology Nurse Navigator Donnie Mesa, MD as Consulting Physician (General Surgery) Nicholas Lose, MD as Consulting Physician (Hematology and Oncology) Gery Pray, MD as Consulting Physician (Radiation Oncology)  DIAGNOSIS:    ICD-10-CM   1. Malignant neoplasm of lower-outer quadrant of left breast of female, estrogen receptor positive ([place])  C50.512    Z17.0       SUMMARY OF ONCOLOGIC HISTORY: Oncology History  Malignant neoplasm of lower-outer quadrant of left breast of female, estrogen receptor positive ([place])  10/01/2020 Initial Diagnosis   Screening mammogram showed a left breast mass and calcifications. Diagnostic mammogram and US showed a 1.7cm and 0.5cm mass at the 5 o'clock position in the left breast, with one 0.4cm abnormal right axillary lymph node. 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   10/09/2020 Cancer Staging   Staging form: Breast, AJCC 8th Edition - Clinical stage from 10/09/2020: Stage IB (cT2, cN1, cM0, G3, ER+, PR+, HER2+) - Signed by Nicholas Lose, MD on 10/09/2020 Stage prefix: Initial diagnosis Histologic grading system: 3 grade system Percentage of positive estrogen receptors (%): 5 Percentage of positive progesterone receptors (%): 1 Ki-67 (%): 20   10/18/2020 -  Chemotherapy    Patient is on Treatment Plan: BREAST  DOCETAXEL + CARBOPLATIN + TRASTUZUMAB + PERTUZUMAB  (TCHP) Q21D        10/23/2020 Genetic Testing   Negative genetic testing:  No pathogenic variants detected on the [employer] CancerNext-Expanded + RNAinsight panel. The report date is 10/23/2020.   The CancerNext-Expanded + RNAinsight gene panel offered by [employer] and includes sequencing and rearrangement analysis for the following 77 genes: AIP, ALK, APC, ATM, AXIN2,  BAP1, BARD1, BLM, BMPR1A, BRCA1, BRCA2, BRIP1, CDC73, CDH1, CDK4, CDKN1B, CDKN2A, CHEK2, CTNNA1, DICER1, FANCC, FH, FLCN, GALNT12, KIF1B, LZTR1, MAX, MEN1, MET, MLH1, MSH2, MSH3, MSH6, MUTYH, NBN, NF1, NF2, NTHL1, PALB2, PHOX2B, PMS2, POT1, PRKAR1A, PTCH1, PTEN, RAD51C, RAD51D, RB1, RECQL, RET, SDHA, SDHAF2, SDHB, SDHC, SDHD, SMAD4, SMARCA4, SMARCB1, SMARCE1, STK11, SUFU, TMEM127, TP53, TSC1, TSC2, VHL and XRCC2 (sequencing and deletion/duplication); EGFR, EGLN1, HOXB13, KIT, MITF, PDGFRA, POLD1 and POLE (sequencing only); EPCAM and GREM1 (deletion/duplication only). RNA data is routinely analyzed for use in variant interpretation for all genes.     CHIEF COMPLIANT: Follow-up after surgery  INTERVAL HISTORY: Tammie [name] is a 45 y.o. with above-mentioned history of left breast cancer who completed neoadjuvant chemotherapy and is here to discuss results of the pathology report.  She is healing and recovering well from the recent surgery.  She has mild discomfort under the arm.  ALLERGIES:  has No Known Allergies.  MEDICATIONS:  Current Outpatient Medications  Medication Sig Dispense Refill   Cholecalciferol (DIALYVITE VITAMIN D 5000) 125 MCG (5000 UT) capsule Take 5,000 Units by mouth daily.     citalopram (CELEXA) 20 MG tablet Take 20 mg by mouth daily.     dexamethasone (DECADRON) 4 MG tablet Take 1 tablet (4 mg total) by mouth 2 (two) times daily. Take 1 tablet day before chemo and 1 tablet day after chemo with food 6 tablet 0   HYDROcodone-acetaminophen (NORCO/VICODIN) 5-325 MG tablet Take 1 tablet by mouth every 6 (six) hours as needed for moderate pain. 15 tablet 0   ibuprofen (ADVIL) 800 MG tablet Take 800 mg by  mouth 2 (two) times daily.     lidocaine-prilocaine (EMLA) cream Apply to affected area once 30 g 3   LORazepam (ATIVAN) 0.5 MG tablet Take 1 tablet (0.5 mg total) by mouth at bedtime as needed for anxiety. 10 tablet 0   metoprolol tartrate (LOPRESSOR) 25 MG tablet Take 25 mg  by mouth daily.     NON FORMULARY Take 900 mg by mouth daily. Hyaluronic Acid Complex     OVER THE COUNTER MEDICATION Take 4 capsules by mouth daily. Laminine supplement- 620 mg     No current facility-administered medications for this visit.    PHYSICAL EXAMINATION: ECOG PERFORMANCE STATUS: 1 - Symptomatic but completely ambulatory  Vitals:   03/24/21 1401  BP: 136/72  Pulse: 94  Resp: 18  Temp: (!) 97.3 F (36.3 C)  SpO2: 100%   Filed Weights   03/24/21 1401  Weight: 147 lb (66.7 kg)      LABORATORY DATA:  I have reviewed the data as listed CMP Latest Ref Rng & Units 02/27/2021 02/07/2021 01/17/2021  Glucose 70 - 99 mg/dL 102(H) 128(H) 112(H)  BUN 6 - 20 mg/dL $Remove'13 14 15  'SjHuuKn$ Creatinine 0.44 - 1.00 mg/dL 0.67 0.70 0.70  Sodium 135 - 145 mmol/L 140 139 137  Potassium 3.5 - 5.1 mmol/L 3.9 3.8 4.0  Chloride 98 - 111 mmol/L 105 103 103  CO2 22 - 32 mmol/L $RemoveB'25 25 25  'HixYYVJs$ Calcium 8.9 - 10.3 mg/dL 8.8(L) 9.6 10.2  Total Protein 6.5 - 8.1 g/dL 6.2(L) 6.9 7.0  Total Bilirubin 0.3 - 1.2 mg/dL <0.2(L) 0.3 0.3  Alkaline Phos 38 - 126 U/L 40 41 43  AST 15 - 41 U/L 14(L) 12(L) 14(L)  ALT 0 - 44 U/L $Remo'13 13 12    'scnHT$ Lab Results  Component Value Date   WBC 4.2 02/27/2021   HGB 8.7 (L) 02/27/2021   HCT 25.4 (L) 02/27/2021   MCV 96.6 02/27/2021   PLT 148 (L) 02/27/2021   NEUTROABS 2.6 02/27/2021    ASSESSMENT & PLAN:  Malignant neoplasm of lower-outer quadrant of left breast of female, estrogen receptor positive ([place]) 10/01/2020:Screening mammogram showed a left breast mass and calcifications. Diagnostic mammogram and US showed a 1.7cm and 0.5cm mass at the 5 o'clock position in the left breast, with one 0.4cm abnormal right axillary lymph node. 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   Treatment Plan: 1. Neoadjuvant chemotherapy with TCH Perjeta 6 cycles followed by Herceptin Perjeta maintenance for 1 year 2. 03/12/2021:  Pathologic complete response, 0/6 lymph nodes negative 3. Followed by adjuvant radiation therapy  4.  Followed by antiestrogen therapy although ER is weak URCC nausea study Breast MRI: 10/17/20: 2.4 cm left breast mass, intramammary lymph node ------------------------------------------------------------------------------------------------------------------------- Current treatment: Completed 6 cycles of TCHP Based on complete pathologic response, maintenance therapy will be with Herceptin and Perjeta. We will resume treatment 03/28/2021. Radiation appointment for 04/07/2021  RTC every 3 weeks for Herceptin and Perjeta maintenance.   Labs and MD visits every 6 weeks      No orders of the defined types were placed in this encounter.  The patient has a good understanding of the overall plan. she agrees with it. she will call with any problems that may develop before the next visit here.  Total time spent: 30 mins including face to face time and time spent for planning, charting and coordination of care  Rulon Eisenmenger, MD,  MPH 03/24/2021  I, Thana Ates, am acting as scribe for Dr. Nicholas Lose.  I have reviewed the above documentation for accuracy and completeness, and I agree with the above.

## 2021-03-24 ENCOUNTER — Other Ambulatory Visit: Payer: Self-pay

## 2021-03-24 ENCOUNTER — Inpatient Hospital Stay: Payer: 59 | Attending: Hematology and Oncology | Admitting: Hematology and Oncology

## 2021-03-24 VITALS — BP 136/72 | HR 94 | Temp 97.3°F | Resp 18 | Ht 63.0 in | Wt 147.0 lb

## 2021-03-24 DIAGNOSIS — C50512 Malignant neoplasm of lower-outer quadrant of left female breast: Secondary | ICD-10-CM | POA: Insufficient documentation

## 2021-03-24 DIAGNOSIS — Z79899 Other long term (current) drug therapy: Secondary | ICD-10-CM | POA: Insufficient documentation

## 2021-03-24 DIAGNOSIS — Z170 Estrogen receptor positive status [ER+]: Secondary | ICD-10-CM | POA: Insufficient documentation

## 2021-03-24 DIAGNOSIS — Z5111 Encounter for antineoplastic chemotherapy: Secondary | ICD-10-CM | POA: Insufficient documentation

## 2021-03-24 NOTE — Assessment & Plan Note (Signed)
10/01/2020:Screening mammogram showed a left breast mass and calcifications. Diagnostic mammogram and US showed a 1.7cm and 0.5cm mass at the 5 o'clock position in the left breast, with one 0.4cm abnormal right axillary lymph node. 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  Treatment Plan: 1. Neoadjuvant chemotherapy with TCH Perjeta 6 cycles followed by Herceptin Perjeta maintenance for 1 year 2. 03/12/2021: Pathologic complete response, 0/6 lymph nodes negative 3. Followed by adjuvant radiation therapy 4.Followed by antiestrogen therapy although ER is weak URCC nausea study Breast MRI: 10/17/20:2.4 cm left breast mass, intramammary lymph node ------------------------------------------------------------------------------------------------------------------------- Current treatment: Completed 6 cycles of TCHP Based on complete pathologic response, maintenance therapy will be with Herceptin and Perjeta.  RTC every3 weeks for Herceptin and Perjeta maintenance.   Labs and MD visits every 6 weeks

## 2021-03-24 NOTE — Progress Notes (Signed)
DISCONTINUE ON PATHWAY REGIMEN - Breast     A cycle is every 21 days:     Pertuzumab      Pertuzumab      Trastuzumab-xxxx      Trastuzumab-xxxx      Carboplatin      Docetaxel   **Always confirm dose/schedule in your pharmacy ordering system**  REASON: Other Reason PRIOR TREATMENT: [id number]: Docetaxel + Carboplatin + Trastuzumab IV + Pertuzumab IV (TCHP IV) q21 Days x 6 Cycles TREATMENT RESPONSE: Unable to Evaluate  START ON PATHWAY REGIMEN - Breast     A cycle is every 21 days:     Trastuzumab-xxxx      Pertuzumab   **Always confirm dose/schedule in your pharmacy ordering system**  Patient Characteristics: Post-Neoadjuvant Therapy and Resection, HER2 Positive, ER Positive, No Residual Disease, Adjuvant Targeted Therapy After Neoadjuvant Chemo/Targeted Therapy Therapeutic Status: Post-Neoadjuvant Therapy and Resection Residual Invasive Disease Post-Neoadjuvant Therapy<= No ER Status: Positive (+) HER2 Status: Positive (+) PR Status: Positive (+) Intent of Therapy: Curative Intent, Discussed with Patient 

## 2021-03-25 ENCOUNTER — Telehealth: Payer: Self-pay | Admitting: Hematology and Oncology

## 2021-03-25 NOTE — Telephone Encounter (Signed)
Scheduled appointments per 10/10 los. Patient aware. Patient requested all other appointments for fridays. Will schedule accordingly.

## 2021-03-28 ENCOUNTER — Other Ambulatory Visit: Payer: Self-pay | Admitting: *Deleted

## 2021-03-28 DIAGNOSIS — Z170 Estrogen receptor positive status [ER+]: Secondary | ICD-10-CM

## 2021-03-28 DIAGNOSIS — C50512 Malignant neoplasm of lower-outer quadrant of left female breast: Secondary | ICD-10-CM

## 2021-03-29 NOTE — Progress Notes (Signed)
Patient Care Team: [name], FNP as PCP - General (Family Medicine) Rockwell Germany, RN as Oncology Nurse Navigator Mauro Kaufmann, RN as Oncology Nurse Navigator Donnie Mesa, MD as Consulting Physician (General Surgery) Nicholas Lose, MD as Consulting Physician (Hematology and Oncology) Gery Pray, MD as Consulting Physician (Radiation Oncology)  DIAGNOSIS:    ICD-10-CM   1. Malignant neoplasm of lower-outer quadrant of left breast of female, estrogen receptor positive ([place])  C50.512    Z17.0       SUMMARY OF ONCOLOGIC HISTORY: Oncology History  Malignant neoplasm of lower-outer quadrant of left breast of female, estrogen receptor positive ([place])  10/01/2020 Initial Diagnosis   Screening mammogram showed a left breast mass and calcifications. Diagnostic mammogram and US showed a 1.7cm and 0.5cm mass at the 5 o'clock position in the left breast, with one 0.4cm abnormal right axillary lymph node. 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   10/09/2020 Cancer Staging   Staging form: Breast, AJCC 8th Edition - Clinical stage from 10/09/2020: Stage IB (cT2, cN1, cM0, G3, ER+, PR+, HER2+) - Signed by Nicholas Lose, MD on 10/09/2020 Stage prefix: Initial diagnosis Histologic grading system: 3 grade system Percentage of positive estrogen receptors (%): 5 Percentage of positive progesterone receptors (%): 1 Ki-67 (%): 20   10/18/2020 - 02/27/2021 Chemotherapy   Patient is on Treatment Plan : BREAST  Docetaxel + Carboplatin + Trastuzumab + Pertuzumab  (TCHP) q21d      10/23/2020 Genetic Testing   Negative genetic testing:  No pathogenic variants detected on the [facility] CancerNext-Expanded + RNAinsight panel. The report date is 10/23/2020.   The CancerNext-Expanded + RNAinsight gene panel offered by [employer] and includes sequencing and rearrangement analysis for the following 77 genes: AIP, ALK, APC, ATM,  AXIN2, BAP1, BARD1, BLM, BMPR1A, BRCA1, BRCA2, BRIP1, CDC73, CDH1, CDK4, CDKN1B, CDKN2A, CHEK2, CTNNA1, DICER1, FANCC, FH, FLCN, GALNT12, KIF1B, LZTR1, MAX, MEN1, MET, MLH1, MSH2, MSH3, MSH6, MUTYH, NBN, NF1, NF2, NTHL1, PALB2, PHOX2B, PMS2, POT1, PRKAR1A, PTCH1, PTEN, RAD51C, RAD51D, RB1, RECQL, RET, SDHA, SDHAF2, SDHB, SDHC, SDHD, SMAD4, SMARCA4, SMARCB1, SMARCE1, STK11, SUFU, TMEM127, TP53, TSC1, TSC2, VHL and XRCC2 (sequencing and deletion/duplication); EGFR, EGLN1, HOXB13, KIT, MITF, PDGFRA, POLD1 and POLE (sequencing only); EPCAM and GREM1 (deletion/duplication only). RNA data is routinely analyzed for use in variant interpretation for all genes.   03/31/2021 -  Chemotherapy   Patient is on Treatment Plan : BREAST Trastuzumab  + Pertuzumab q21d x 13 cycles       CHIEF COMPLIANT: Herceptin and Pertuzumab maintenance  INTERVAL HISTORY: Tammie [name] is a 45 y.o. with above-mentioned history of left breast cancer who completed neoadjuvant chemotherapy. She presents to the clinic today for cycle 1 of Herceptin and Pertuzumab.  She is healing and recovering very well from surgery.  She is here to start maintenance therapy with Herceptin and Perjeta.  ALLERGIES:  has No Known Allergies.  MEDICATIONS:  Current Outpatient Medications  Medication Sig Dispense Refill   Cholecalciferol (DIALYVITE VITAMIN D 5000) 125 MCG (5000 UT) capsule Take 5,000 Units by mouth daily.     citalopram (CELEXA) 20 MG tablet Take 20 mg by mouth daily.     ibuprofen (ADVIL) 800 MG tablet Take 800 mg by mouth 2 (two) times daily.     LORazepam (ATIVAN) 0.5 MG tablet Take 1 tablet (0.5 mg total) by mouth at bedtime as needed for anxiety. 10 tablet  0   metoprolol tartrate (LOPRESSOR) 25 MG tablet Take 25 mg by mouth daily.     NON FORMULARY Take 900 mg by mouth daily. Hyaluronic Acid Complex     OVER THE COUNTER MEDICATION Take 4 capsules by mouth daily. Laminine supplement- 620 mg     No current  facility-administered medications for this visit.    PHYSICAL EXAMINATION: ECOG PERFORMANCE STATUS: 1 - Symptomatic but completely ambulatory  Vitals:   03/31/21 0947  BP: 134/74  Pulse: 86  Resp: 14  Temp: (!) 97.3 F (36.3 C)  SpO2: 100%   Filed Weights   03/31/21 0947  Weight: 148 lb 6.4 oz (67.3 kg)    LABORATORY DATA:  I have reviewed the data as listed CMP Latest Ref Rng & Units 03/31/2021 02/27/2021 02/07/2021  Glucose 70 - 99 mg/dL 110(H) 102(H) 128(H)  BUN 6 - 20 mg/dL $Remove'15 13 14  'rHlfnkO$ Creatinine 0.44 - 1.00 mg/dL 0.71 0.67 0.70  Sodium 135 - 145 mmol/L 142 140 139  Potassium 3.5 - 5.1 mmol/L 3.4(L) 3.9 3.8  Chloride 98 - 111 mmol/L 105 105 103  CO2 22 - 32 mmol/L $RemoveB'27 25 25  'aQcNoqnR$ Calcium 8.9 - 10.3 mg/dL 9.9 8.8(L) 9.6  Total Protein 6.5 - 8.1 g/dL 6.9 6.2(L) 6.9  Total Bilirubin 0.3 - 1.2 mg/dL 0.3 <0.2(L) 0.3  Alkaline Phos 38 - 126 U/L 49 40 41  AST 15 - 41 U/L 15 14(L) 12(L)  ALT 0 - 44 U/L $Remo'17 13 13    'xCJEo$ Lab Results  Component Value Date   WBC 6.1 03/31/2021   HGB 10.9 (L) 03/31/2021   HCT 32.4 (L) 03/31/2021   MCV 92.8 03/31/2021   PLT 207 03/31/2021   NEUTROABS 3.4 03/31/2021    ASSESSMENT & PLAN:  Malignant neoplasm of lower-outer quadrant of left breast of female, estrogen receptor positive ([place]) 10/01/2020:Screening mammogram showed a left breast mass and calcifications. Diagnostic mammogram and US showed a 1.7cm and 0.5cm mass at the 5 o'clock position in the left breast, with one 0.4cm abnormal right axillary lymph node. 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   Treatment Plan: 1. Neoadjuvant chemotherapy with TCH Perjeta 6 cycles followed by Herceptin Perjeta maintenance for 1 year 2. 03/12/2021: Pathologic complete response, 0/6 lymph nodes negative 3. Followed by adjuvant radiation therapy  4.  Followed by antiestrogen therapy although ER is weak URCC nausea study Breast MRI:  10/17/20: 2.4 cm left breast mass, intramammary lymph node ------------------------------------------------------------------------------------------------------------------------- Current treatment: Completed 6 cycles of TCHP, currently on Herceptin and Perjeta maintenance Based on complete pathologic response, maintenance therapy will be with Herceptin and Perjeta. Radiation appointment for 04/07/2021   RTC every 3 weeks for Herceptin and Perjeta maintenance.   Labs and MD visits every 6 weeks    No orders of the defined types were placed in this encounter.  The patient has a good understanding of the overall plan. she agrees with it. she will call with any problems that may develop before the next visit here.  Total time spent: 30 mins including face to face time and time spent for planning, charting and coordination of care  Rulon Eisenmenger, MD, MPH 03/31/2021  I, Thana Ates, am acting as scribe for Dr. Nicholas Lose.  I have reviewed the above documentation for accuracy and completeness, and I agree with the above.

## 2021-03-31 ENCOUNTER — Inpatient Hospital Stay: Payer: 59

## 2021-03-31 ENCOUNTER — Other Ambulatory Visit: Payer: Self-pay

## 2021-03-31 ENCOUNTER — Inpatient Hospital Stay (HOSPITAL_BASED_OUTPATIENT_CLINIC_OR_DEPARTMENT_OTHER): Payer: 59 | Admitting: Hematology and Oncology

## 2021-03-31 DIAGNOSIS — C50512 Malignant neoplasm of lower-outer quadrant of left female breast: Secondary | ICD-10-CM

## 2021-03-31 DIAGNOSIS — Z5111 Encounter for antineoplastic chemotherapy: Secondary | ICD-10-CM | POA: Diagnosis not present

## 2021-03-31 DIAGNOSIS — Z170 Estrogen receptor positive status [ER+]: Secondary | ICD-10-CM | POA: Diagnosis not present

## 2021-03-31 DIAGNOSIS — Z95828 Presence of other vascular implants and grafts: Secondary | ICD-10-CM

## 2021-03-31 LAB — CBC WITH DIFFERENTIAL (CANCER CENTER ONLY)
Abs Immature Granulocytes: 0.01 10*3/uL (ref 0.00–0.07)
Basophils Absolute: 0 10*3/uL (ref 0.0–0.1)
Basophils Relative: 0 %
Eosinophils Absolute: 0.4 10*3/uL (ref 0.0–0.5)
Eosinophils Relative: 7 %
HCT: 32.4 % — ABNORMAL LOW (ref 36.0–46.0)
Hemoglobin: 10.9 g/dL — ABNORMAL LOW (ref 12.0–15.0)
Immature Granulocytes: 0 %
Lymphocytes Relative: 28 %
Lymphs Abs: 1.7 10*3/uL (ref 0.7–4.0)
MCH: 31.2 pg (ref 26.0–34.0)
MCHC: 33.6 g/dL (ref 30.0–36.0)
MCV: 92.8 fL (ref 80.0–100.0)
Monocytes Absolute: 0.6 10*3/uL (ref 0.1–1.0)
Monocytes Relative: 9 %
Neutro Abs: 3.4 10*3/uL (ref 1.7–7.7)
Neutrophils Relative %: 56 %
Platelet Count: 207 10*3/uL (ref 150–400)
RBC: 3.49 MIL/uL — ABNORMAL LOW (ref 3.87–5.11)
RDW: 13.9 % (ref 11.5–15.5)
WBC Count: 6.1 10*3/uL (ref 4.0–10.5)
nRBC: 0 % (ref 0.0–0.2)

## 2021-03-31 LAB — CMP (CANCER CENTER ONLY)
ALT: 17 U/L (ref 0–44)
AST: 15 U/L (ref 15–41)
Albumin: 3.9 g/dL (ref 3.5–5.0)
Alkaline Phosphatase: 49 U/L (ref 38–126)
Anion gap: 10 (ref 5–15)
BUN: 15 mg/dL (ref 6–20)
CO2: 27 mmol/L (ref 22–32)
Calcium: 9.9 mg/dL (ref 8.9–10.3)
Chloride: 105 mmol/L (ref 98–111)
Creatinine: 0.71 mg/dL (ref 0.44–1.00)
GFR, Estimated: >60 mL/min (ref >60)
Glucose, Bld: 110 mg/dL — ABNORMAL HIGH (ref 70–99)
Potassium: 3.4 mmol/L — ABNORMAL LOW (ref 3.5–5.1)
Sodium: 142 mmol/L (ref 135–145)
Total Bilirubin: 0.3 mg/dL (ref 0.3–1.2)
Total Protein: 6.9 g/dL (ref 6.5–8.1)

## 2021-03-31 LAB — PREGNANCY, URINE: Preg Test, Ur: NEGATIVE

## 2021-03-31 MED ORDER — SODIUM CHLORIDE 0.9 % IV SOLN
420.0000 mg | Freq: Once | INTRAVENOUS | Status: AC
  Administered 2021-03-31: 420 mg via INTRAVENOUS
  Filled 2021-03-31: qty 14

## 2021-03-31 MED ORDER — DIPHENHYDRAMINE HCL 25 MG PO CAPS
25.0000 mg | ORAL_CAPSULE | Freq: Once | ORAL | Status: AC
  Administered 2021-03-31: 25 mg via ORAL
  Filled 2021-03-31: qty 1

## 2021-03-31 MED ORDER — SODIUM CHLORIDE 0.9 % IV SOLN: Freq: Once | INTRAVENOUS | Status: AC

## 2021-03-31 MED ORDER — HEPARIN SOD (PORK) LOCK FLUSH 100 UNIT/ML IV SOLN
500.0000 [IU] | Freq: Once | INTRAVENOUS | Status: AC | PRN
  Administered 2021-03-31: 500 [IU]

## 2021-03-31 MED ORDER — SODIUM CHLORIDE 0.9% FLUSH
10.0000 mL | Freq: Once | INTRAVENOUS | Status: AC
  Administered 2021-03-31: 10 mL

## 2021-03-31 MED ORDER — ACETAMINOPHEN 325 MG PO TABS
650.0000 mg | ORAL_TABLET | Freq: Once | ORAL | Status: AC
  Administered 2021-03-31: 650 mg via ORAL
  Filled 2021-03-31: qty 2

## 2021-03-31 MED ORDER — TRASTUZUMAB-ANNS CHEMO 150 MG IV SOLR
6.0000 mg/kg | Freq: Once | INTRAVENOUS | Status: AC
  Administered 2021-03-31: 399 mg via INTRAVENOUS
  Filled 2021-03-31: qty 19

## 2021-03-31 MED ORDER — SODIUM CHLORIDE 0.9% FLUSH
10.0000 mL | INTRAVENOUS | Status: DC | PRN
  Administered 2021-03-31: 10 mL

## 2021-03-31 MED ORDER — MAGNESIUM 500 MG PO CAPS: 1.0000 | ORAL_CAPSULE | Freq: Every day | ORAL | 0 refills | Status: AC

## 2021-03-31 NOTE — Assessment & Plan Note (Signed)
10/01/2020:Screening mammogram showed a left breast mass and calcifications. Diagnostic mammogram and US showed a 1.7cm and 0.5cm mass at the 5 o'clock position in the left breast, with one 0.4cm abnormal right axillary lymph node. 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  Treatment Plan: 1. Neoadjuvant chemotherapy with TCH Perjeta 6 cycles followed by Herceptin Perjeta maintenance for 1 year 2. 03/12/2021: Pathologic complete response, 0/6 lymph nodes negative 3. Followed by adjuvant radiation therapy 4.Followed by antiestrogen therapy although ER is weak URCC nausea study Breast MRI: 10/17/20:2.4 cm left breast mass, intramammary lymph node ------------------------------------------------------------------------------------------------------------------------- Current treatment: Completed 6 cycles of TCHP, currently on Herceptin and Perjeta maintenance Based on complete pathologic response, maintenance therapy will be with Herceptin and Perjeta. We will resume treatment 03/28/2021. Radiation appointment for 04/07/2021  RTC every3 weeks forHerceptin and Perjeta maintenance.  Labs and MD visits every 6 weeks

## 2021-03-31 NOTE — Patient Instructions (Signed)
[facility] ONCOLOG[facility]  Discharge Instructions: Thank you for choosing [facility] to provide your oncology and hematology care.   If you have a lab appointment with the [facility], please go directly to the Person and check in at the registration area.   Wear comfortable clothing and clothing appropriate for easy access to any Portacath or PICC line.   We strive to give you quality time with your provider. You may need to reschedule your appointment if you arrive late (15 or more minutes).  Arriving late affects you and other patients whose appointments are after yours.  Also, if you miss three or more appointments without notifying the office, you may be dismissed from the clinic at the provider's discretion.      For prescription refill requests, have your pharmacy contact our office and allow 72 hours for refills to be completed.   Today you received the following chemotherapy and/or immunotherapy agents Trastuzumab/Pertuzumab      To help prevent nausea and vomiting after your treatment, we encourage you to take your nausea medication as directed.  BELOW ARE SYMPTOMS THAT SHOULD BE REPORTED IMMEDIATELY: *FEVER GREATER THAN 100.4 F (38 C) OR HIGHER *CHILLS OR SWEATING *NAUSEA AND VOMITING THAT IS NOT CONTROLLED WITH YOUR NAUSEA MEDICATION *UNUSUAL SHORTNESS OF BREATH *UNUSUAL BRUISING OR BLEEDING *URINARY PROBLEMS (pain or burning when urinating, or frequent urination) *BOWEL PROBLEMS (unusual diarrhea, constipation, pain near the anus) TENDERNESS IN MOUTH AND THROAT WITH OR WITHOUT PRESENCE OF ULCERS (sore throat, sores in mouth, or a toothache) UNUSUAL RASH, SWELLING OR PAIN  UNUSUAL VAGINAL DISCHARGE OR ITCHING   Items with * indicate a potential emergency and should be followed up as soon as possible or go to the Emergency Department if any problems should occur.  Please show the CHEMOTHERAPY ALERT CARD or IMMUNOTHERAPY ALERT CARD at  check-in to the Emergency Department and triage nurse.  Should you have questions after your visit or need to cancel or reschedule your appointment, please contact [facility]  Dept: [phone]  and follow the prompts.  Office hours are 8:00 a.m. to 4:30 p.m. Monday - Friday. Please note that voicemails left after 4:00 p.m. may not be returned until the following business day.  We are closed weekends and major holidays. You have access to a nurse at all times for urgent questions. Please call the main number to the clinic Dept: [phone] and follow the prompts.   For any non-urgent questions, you may also contact your provider using MyChart. We now offer e-Visits for anyone 35 and older to request care online for non-urgent symptoms. For details visit [email or web address].   Also download the MyChart app! Go to the app store, search "MyChart", open the app, select [facility], and log in with your MyChart username and password.  Due to Covid, a mask is required upon entering the hospital/clinic. If you do not have a mask, one will be given to you upon arrival. For doctor visits, patients may have 1 support person aged 10 or older with them. For treatment visits, patients cannot have anyone with them due to current Covid guidelines and our immunocompromised population.

## 2021-04-02 NOTE — Progress Notes (Signed)
Location of Breast Cancer: Malignant neoplasm of lower-outer quadrant of left breast  Histology per Pathology Report:  10/01/2020 1. Breast, left, needle core biopsy, 5 o'clock posterior - INVASIVE DUCTAL CARCINOMA, SEE COMMENT. - DUCTAL CARCINOMA IN SITU. 2. Breast, left, needle core biopsy, 5 o'clock anterior - INVASIVE DUCTAL CARCINOMA, SEE COMMENT. - DUCTAL CARCINOMA IN SITU. 3. Lymph node, needle/core biopsy, left axilla - METASTATIC CARCINOMA IN A LYMPH NODE.  03/12/2021   Receptor Status:  The tumor cells are POSITIVE for Her2 (3+). Estrogen Receptor: 5%, POSITIVE, WEAK STAINING INTENSITY Progesterone Receptor: 1%, POSITIVE, STRONG STAINING INTENSITY Proliferation Marker Ki67: 20%  Did patient present with symptoms (if so, please note symptoms) or was this found on screening mammography?: Screening mammogram showed a left breast mass and calcifications  Past/Anticipated interventions by surgeon, if any:  Procedure:  Left radioactive seed localized lumpectomy/ left axillary targeted lymph node dissection and sentinel lymph node biopsy with Magtrace injection Surgeon:  Maia Petties.  Past/Anticipated interventions by medical oncology, if any:  10/18/2020 - 02/27/2021 Chemotherapy    Patient is on Treatment Plan : BREAST  Docetaxel + Carboplatin + Trastuzumab + Pertuzumab  (TCHP) q21d     03/31/2021 -  Chemotherapy    Patient is on Treatment Plan : BREAST Trastuzumab  + Pertuzumab q21d x 13 cycles     Lymphedema issues, if any:  no    Pain issues, if any:  yes constant and tenderness  SAFETY ISSUES: Prior radiation? no Pacemaker/ICD? no Possible current pregnancy?no, chemotherapy Is the patient on methotrexate? no  Current Complaints / other details:  Saw blood tinged with green secretion at incision which cleared with antibiotic ointment    Vitals:   04/07/21 0739  BP: 121/80  Pulse: 84  Resp: 16  Temp: (!) 97.5 F (36.4 C)  SpO2: 100%  Weight: 147 lb 12.8  oz (67 kg)  Height: $Remove'5\' 3"'VXrzRwb$  (1.6 m)

## 2021-04-04 NOTE — Progress Notes (Signed)
Radiation Oncology         [phone] ________________________________  Name: Tammie [name] MRN: [id number]  Date: 04/07/2021  DOB: 01/19/1976  Re-Evaluation Note  CC: [name], FNP  Nicholas Lose, MD    ICD-10-CM   1. Malignant neoplasm of lower-outer quadrant of left breast of female, estrogen receptor positive ([place])  C50.512    Z17.0       Diagnosis:   Status post left lumpectomy and chemotherapy: Stage IB (cT2, cN1, cM0) Left Breast LOQ, No residual carcinoma, ER+ / PR+ / Her2+ (previously grade 3)  Narrative:  The patient returns today to discuss radiation treatment options. She was seen in the multidisciplinary breast clinic on 10/09/20.   Since consultation, she underwent genetic testing on 10/09/20. Results were negative, showing no clinically significant variants detected.  She has been treated with neoadjuvant chemotherapy consisting of TCH Perjeta (6 cycles) on 10/18/20 through 02/27/21 under Dr. Lindi Adie. Patient is now on Herceptin Perjeta maintenance for 1 year. Chemo toxicities included nausea (managed with antiementics), diarrhea (managed with imodium), and anemia which is being closely followed by Dr. Lindi Adie (patient is a [employer] and will not receive blood).  She opted to proceed with left breast lumpectomy with nodal biopsies on 03/12/21 under the care of Dr. Georgette Dover. Pathology from the procedure revealed: no residual carcinoma identified, with patchy fibrosis with scattered chronic inflammatory cells, and focal calcifications from left lumpectomy. All margins negative for carcinoma. Left axillary lymph node contents x4 negative for carcinoma, and 2/2 left axillary sentinel lymph nodes negative for residual carcinoma. Prognositc indicators significant for: ER 5% positive with weak staining intensity; PR 1% positive with strong staining intensity; Her status positive; Ki67 20% (previously grade 3).   Pertinent imaging since the patient was last seen  for consultation includes:  --Bilateral breast MRI on 10/16/20 which showed the known abnormality measuring 2.4 x 1.3 cm in the lateral lower left breast (consistent with biopsied breast cancer), and a single eccentric thickened cortex lymph node in the posteromedial lower left breast measuring 1.25 cm.  --Left breast ultrasound on 10/28/20 revealing the intraparenchymal lymph node within the posterior inner left breast as benign, correlating with MRI. --Bilateral breast MRI on 02/10/21 demonstrating a complete response to chemotherapy, with the biopsied malignancies in the left breast and axilla no longer visible.   On review of systems, the patient reports no significant pain within the left breast.  She noticed some slight drainage from her incision.  She denies any chills or fever. She denies significant swelling in her left arm or hand.     Allergies:  has No Known Allergies.  Meds: Current Outpatient Medications  Medication Sig Dispense Refill   ascorbic acid (VITAMIN C) 1000 MG tablet Take by mouth.     Cholecalciferol (DIALYVITE VITAMIN D 5000) 125 MCG (5000 UT) capsule Take 5,000 Units by mouth daily.     citalopram (CELEXA) 20 MG tablet Take 20 mg by mouth daily.     ferrous sulfate 325 (65 FE) MG tablet Take by mouth.     ibuprofen (ADVIL) 800 MG tablet Take 800 mg by mouth 2 (two) times daily.     Magnesium 500 MG CAPS Take 1 capsule (500 mg total) by mouth daily.  0   metoprolol tartrate (LOPRESSOR) 25 MG tablet Take 25 mg by mouth daily.     NON FORMULARY Take 900 mg by mouth daily. Hyaluronic Acid Complex     OVER THE COUNTER MEDICATION Take 4  capsules by mouth daily. Laminine supplement- 620 mg     No current facility-administered medications for this encounter.    Physical Findings: The patient is in no acute distress. Patient is alert and oriented.  height is $RemoveB'5\' 3"'mjusiOnB$  (1.6 m) and weight is 147 lb 12.8 oz (67 kg). Her temperature is 97.5 F (36.4 C) (abnormal). Her blood  pressure is 121/80 and her pulse is 84. Her respiration is 16 and oxygen saturation is 100%.  . Lungs are clear to auscultation bilaterally. Heart has regular rate and rhythm. No palpable cervical, supraclavicular, or axillary adenopathy. Abdomen soft, non-tender, normal bowel sounds. Right breast: no palpable mass, nipple discharge or bleeding. Left breast: Well-healing lumpectomy scar.  The patient also has a scar in the axillary region which is also healing well without signs of drainage or infection.  Lab Findings: Lab Results  Component Value Date   WBC 6.1 03/31/2021   HGB 10.9 (L) 03/31/2021   HCT 32.4 (L) 03/31/2021   MCV 92.8 03/31/2021   PLT 207 03/31/2021    Radiographic Findings: NM Sentinel Node Inj-No Rpt (Breast)  Result Date: 03/12/2021 Sulfur Colloid was injected by the Nuclear Medicine Technologist for sentinel lymph node localization.   MM Breast Surgical Specimen  Result Date: 03/12/2021 CLINICAL DATA:  Evaluate surgical specimen following LEFT lumpectomy and LEFT axillary lymph node excision. EXAM: SPECIMEN RADIOGRAPH OF THE LEFT BREAST SPECIMEN RADIOGRAPH OF THE LEFT AXILLA COMPARISON:  Previous exam(s). FINDINGS: Status post excision of the LEFT breast and axilla. The 2 radioactive seeds, COIL biopsy clip and RIBBON biopsy clip are present within the surgical specimen, completely intact, and were marked for pathology. The radioactive seed and TRIBELL biopsy clip are present within the surgical specimen in the container. IMPRESSION: Specimen radiograph of the LEFT breast and LEFT axilla. Electronically Signed   By: Margarette Canada M.D.   On: 03/12/2021 13:53  MM Breast Surgical Specimen  Result Date: 03/12/2021 CLINICAL DATA:  Evaluate surgical specimen following LEFT lumpectomy and LEFT axillary lymph node excision. EXAM: SPECIMEN RADIOGRAPH OF THE LEFT BREAST SPECIMEN RADIOGRAPH OF THE LEFT AXILLA COMPARISON:  Previous exam(s). FINDINGS: Status post excision of the LEFT  breast and axilla. The 2 radioactive seeds, COIL biopsy clip and RIBBON biopsy clip are present within the surgical specimen, completely intact, and were marked for pathology. The radioactive seed and TRIBELL biopsy clip are present within the surgical specimen in the container. IMPRESSION: Specimen radiograph of the LEFT breast and LEFT axilla. Electronically Signed   By: Margarette Canada M.D.   On: 03/12/2021 13:53  MM LT RADIOACTIVE SEED LOC MAMMO GUIDE  Result Date: 03/11/2021 CLINICAL DATA:  Patient for preoperative localization prior to left breast lumpectomy. EXAM: MAMMOGRAPHIC GUIDED RADIOACTIVE SEED LOCALIZATION OF THE LEFT BREAST COMPARISON:  Previous exam(s). FINDINGS: Patient presents for radioactive seed localization prior to left breast lumpectomy. I met with the patient and we discussed the procedure of seed localization including benefits and alternatives. We discussed the high likelihood of a successful procedure. We discussed the risks of the procedure including infection, bleeding, tissue injury and further surgery. We discussed the low dose of radioactivity involved in the procedure. Informed, written consent was given. The usual time-out protocol was performed immediately prior to the procedure. Site 1: Ribbon shaped clip Using mammographic guidance, sterile technique, 1% lidocaine and an I-125 radioactive seed, ribbon shaped clip was localized using a lateral approach. The follow-up mammogram images confirm the seed in the expected location and were marked for Dr.  Tsuei. Follow-up survey of the patient confirms presence of the radioactive seed. Order number of I-125 seed:  [id number]. Total activity:  4.098 millicuries reference Date: 11/22/2020 Site 2: Coil clip Using mammographic guidance, sterile technique, 1% lidocaine and an I-125 radioactive seed, coil clip was localized using a lateral approach. The follow-up mammogram images confirm the seed in the expected location and were marked for  Dr. Georgette Dover. Follow-up survey of the patient confirms presence of the radioactive seed. Order number of I-125 seed:  [id number]. Total activity:  5.621 millicuries reference Date: 11/22/2020 The patient tolerated the procedure well and was released from the [facility]. She was given instructions regarding seed removal. IMPRESSION: Radioactive seed localization left breast. No apparent complications. Electronically Signed   By: Lovey Newcomer M.D.   On: 03/11/2021 09:57  MM LT RAD SEED EA ADD LESION LOC MAMMO  Result Date: 03/11/2021 CLINICAL DATA:  Patient for preoperative localization prior to left breast lumpectomy. EXAM: MAMMOGRAPHIC GUIDED RADIOACTIVE SEED LOCALIZATION OF THE LEFT BREAST COMPARISON:  Previous exam(s). FINDINGS: Patient presents for radioactive seed localization prior to left breast lumpectomy. I met with the patient and we discussed the procedure of seed localization including benefits and alternatives. We discussed the high likelihood of a successful procedure. We discussed the risks of the procedure including infection, bleeding, tissue injury and further surgery. We discussed the low dose of radioactivity involved in the procedure. Informed, written consent was given. The usual time-out protocol was performed immediately prior to the procedure. Site 1: Ribbon shaped clip Using mammographic guidance, sterile technique, 1% lidocaine and an I-125 radioactive seed, ribbon shaped clip was localized using a lateral approach. The follow-up mammogram images confirm the seed in the expected location and were marked for Dr. Georgette Dover. Follow-up survey of the patient confirms presence of the radioactive seed. Order number of I-125 seed:  [id number]. Total activity:  9.629 millicuries reference Date: 11/22/2020 Site 2: Coil clip Using mammographic guidance, sterile technique, 1% lidocaine and an I-125 radioactive seed, coil clip was localized using a lateral approach. The follow-up mammogram images confirm  the seed in the expected location and were marked for Dr. Georgette Dover. Follow-up survey of the patient confirms presence of the radioactive seed. Order number of I-125 seed:  [id number]. Total activity:  0.102 millicuries reference Date: 11/22/2020 The patient tolerated the procedure well and was released from the [facility]. She was given instructions regarding seed removal. IMPRESSION: Radioactive seed localization left breast. No apparent complications. Electronically Signed   By: Lovey Newcomer M.D.   On: 03/11/2021 09:57  [place] LT RADIOACTIVE SEED LOC  Result Date: 03/11/2021 CLINICAL DATA:  Patient for left breast lumpectomy and left axillary lymph node removal. EXAM: ULTRASOUND GUIDED RADIOACTIVE SEED LOCALIZATION OF THE LEFT BREAST COMPARISON:  Previous exam(s). FINDINGS: Patient presents for radioactive seed localization prior to left axillary lymph node removal. I met with the patient and we discussed the procedure of seed localization including benefits and alternatives. We discussed the high likelihood of a successful procedure. We discussed the risks of the procedure including infection, bleeding, tissue injury and further surgery. We discussed the low dose of radioactivity involved in the procedure. Informed, written consent was given. The usual time-out protocol was performed immediately prior to the procedure. Using ultrasound guidance, sterile technique, 1% lidocaine and an I-125 radioactive seed, left axillary node was localized using a lateral approach. The follow-up mammogram images confirm the seed in the expected location and were marked for Dr. Georgette Dover. Follow-up survey of the  patient confirms presence of the radioactive seed. Order number of I-125 seed:  [id number]. Total activity: 8.546 millicuries reference Date: February 27, 2021 The patient tolerated the procedure well and was released from the [facility]. She was given instructions regarding seed removal. IMPRESSION: Radioactive seed  localization left axilla. No apparent complications. Electronically Signed   By: Lovey Newcomer M.D.   On: 03/11/2021 09:58  MM CLIP PLACEMENT LEFT  Result Date: 03/11/2021 CLINICAL DATA:  Patient status post ultrasound-guided seed placement for left axillary lymph node. Radioactive seeds were placed in the left breast prior using mammogram guidance. EXAM: DIAGNOSTIC LEFT MAMMOGRAM POST ULTRASOUND-GUIDED RADIOACTIVE SEED COMPARISON:  Previous exam(s). FINDINGS: Mammographic images were obtained following ultrasound-guided radioactive seed placement. These demonstrate the radioactive seed to be located within the left axillary lymph node. The additional radioactive seeds within the left breast are located in appropriate position adjacent to the coil shaped and ribbon shaped marking clips. IMPRESSION: Appropriate location of the radioactive seeds. Final Assessment: Post Procedure Mammograms for Seed Placement Electronically Signed   By: Lovey Newcomer M.D.   On: 03/11/2021 10:00   Impression:  Status post left lumpectomy and chemotherapy: Stage IB (cT2, cN1, cM0) Left Breast LOQ, No residual carcinoma, ER+ / PR+ / Her2+ (previously grade 3)  The patient has had an excellent response to her neoadjuvant chemotherapy.  As above no residual cancer in the breast or axillary region.  I however still would recommend adjuvant radiation therapy encompassing the left breast and axillary areas given her biopsy-proven disease in the left axilla prior to neoadjuvant chemotherapy.  I discussed the general course of radiation therapy, anticipated side effects and potential long-term toxicities of radiation therapy with the patient in detail.  The patient appears to understand these issues and wishes to proceed with radiation therapy as recommended.  Plan:  Patient is scheduled for CT simulation later today.  Treatment to begin in approximately a week.  Anticipate 5 and half weeks of radiation therapy directed to the left  breast and axillary region.  We will use cardiac sparing techniques if necessary.  No plans for a lumpectomy cavity boost given the complete response to her neoadjuvant chemotherapy.  -----------------------------------  Blair Promise, PhD, MD  This document serves as a record of services personally performed by Gery Pray, MD. It was created on his behalf by Roney Mans, a trained medical scribe. The creation of this record is based on the scribe's personal observations and the provider's statements to them. This document has been checked and approved by the attending provider.

## 2021-04-07 ENCOUNTER — Ambulatory Visit
Admission: RE | Admit: 2021-04-07 | Discharge: 2021-04-07 | Disposition: A | Discharge status: Home / Self Care | Payer: 59 | Source: Ambulatory Visit | Attending: Radiation Oncology | Admitting: Radiation Oncology

## 2021-04-07 ENCOUNTER — Encounter: Payer: Self-pay | Admitting: Radiation Oncology

## 2021-04-07 ENCOUNTER — Other Ambulatory Visit: Payer: Self-pay

## 2021-04-07 VITALS — BP 121/80 | HR 84 | Temp 97.5°F | Resp 16 | Ht 63.0 in | Wt 147.8 lb

## 2021-04-07 DIAGNOSIS — C50512 Malignant neoplasm of lower-outer quadrant of left female breast: Secondary | ICD-10-CM | POA: Insufficient documentation

## 2021-04-07 DIAGNOSIS — Z170 Estrogen receptor positive status [ER+]: Secondary | ICD-10-CM | POA: Insufficient documentation

## 2021-04-07 DIAGNOSIS — D649 Anemia, unspecified: Secondary | ICD-10-CM | POA: Insufficient documentation

## 2021-04-07 DIAGNOSIS — R110 Nausea: Secondary | ICD-10-CM | POA: Insufficient documentation

## 2021-04-07 DIAGNOSIS — R197 Diarrhea, unspecified: Secondary | ICD-10-CM | POA: Insufficient documentation

## 2021-04-07 DIAGNOSIS — Z79899 Other long term (current) drug therapy: Secondary | ICD-10-CM | POA: Insufficient documentation

## 2021-04-07 HISTORY — DX: Malignant neoplasm of unspecified site of unspecified female breast: C50.919

## 2021-04-07 NOTE — Progress Notes (Signed)
See MD note for nursing evaluation. °

## 2021-04-10 ENCOUNTER — Ambulatory Visit: Payer: 59 | Attending: Surgery | Admitting: Physical Therapy

## 2021-04-10 ENCOUNTER — Other Ambulatory Visit: Payer: Self-pay

## 2021-04-10 DIAGNOSIS — R293 Abnormal posture: Secondary | ICD-10-CM | POA: Insufficient documentation

## 2021-04-10 DIAGNOSIS — C50512 Malignant neoplasm of lower-outer quadrant of left female breast: Secondary | ICD-10-CM | POA: Diagnosis not present

## 2021-04-10 DIAGNOSIS — Z483 Aftercare following surgery for neoplasm: Secondary | ICD-10-CM | POA: Diagnosis present

## 2021-04-10 DIAGNOSIS — Z170 Estrogen receptor positive status [ER+]: Secondary | ICD-10-CM | POA: Diagnosis present

## 2021-04-10 NOTE — Therapy (Signed)
[facility] @ [place], [place], [address] Phone: [phone]   Fax:  [phone]  Physical Therapy Treatment  Patient Details  Name: Tammie [name] MRN: [id number] Date of Birth: 1976/05/21 Referring Provider (PT): Dr. Donnie Mesa   Encounter Date: 04/10/2021   PT End of Session - 04/10/21 1041     Visit Number 2    Number of Visits 2    PT Start Time 1005    PT Stop Time 1038    PT Time Calculation (min) 33 min    Activity Tolerance Patient tolerated treatment well    Behavior During Therapy [facility] for tasks assessed/performed             Past Medical History:  Diagnosis Date   Anxiety    Arrhythmia    palpitation   Breast cancer ([place])    Depression    Family history of breast cancer    Family history of leukemia    Family history of prostate cancer    Family history of stomach cancer    Fibroid, uterine    Fibromyalgia    Hx of degenerative disc disease    Hypertension    Interstitial cystitis    Microadenoma    Migraines    PONV (postoperative nausea and vomiting)    Pre-diabetes     Past Surgical History:  Procedure Laterality Date   BREAST LUMPECTOMY WITH RADIOACTIVE SEED AND SENTINEL LYMPH NODE BIOPSY Left 03/12/2021   Procedure: LEFT BREAST LUMPECTOMY WITH RADIOACTIVE SEED X2 AND LEFT SENTINEL LYMPH NODE BIOPSY;  Surgeon: Donnie Mesa, MD;  Location: [facility];  Service: General;  Laterality: Left;   CHOLECYSTECTOMY     PORTACATH PLACEMENT Right 10/17/2020   Procedure: INSERTION PORT-A-CATH, ULTRASOUND GUIDED;  Surgeon: Donnie Mesa, MD;  Location: [facility];  Service: General;  Laterality: Right;   RADIOACTIVE SEED GUIDED AXILLARY SENTINEL LYMPH NODE Left 03/12/2021   Procedure: RADIOACTIVE SEED GUIDED LEFT AXILLARY SENTINEL LYMPH NODE BIOPSY;  Surgeon: Donnie Mesa, MD;  Location: [facility];  Service: General;  Laterality: Left;     There were no vitals filed for this visit.   Subjective Assessment - 04/10/21 1001     Subjective Patient underwent neoadjuvant chemotherapy 10/18/2020 - 02/27/2021 followed by a left lumpectomy and sentinel node biopsy (6 negative nodes) on 03/12/2021. She is continuing Herceptin and will undergo radiation beginning 04/15/2021 and anti-estrogen therapy.    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 It is ER/PR weakly positive and HER2 positive with a Ki67 of 20%.    Patient Stated Goals Get my arm moving better and decrease pain    Currently in Pain? Yes    Pain Score 3     Pain Location Axilla    Pain Orientation Left    Pain Descriptors / Indicators Tender    Pain Type Surgical pain    Pain Onset 1 to 4 weeks ago    Pain Frequency Intermittent    Aggravating Factors  Clothing    Pain Relieving Factors Nothing                [facility] PT Assessment - 04/10/21 0001       Assessment   Medical Diagnosis s/p left lumpectomy and SLNB  Referring Provider (PT) Dr. Donnie Mesa    Onset Date/Surgical Date 03/12/21    Hand Dominance Right    Prior Therapy Baselines      Precautions   Precautions Other (comment)    Precaution Comments recent surgery and left arm lymphedema risk      Restrictions   Weight Bearing Restrictions No      Balance Screen   Has the patient fallen in the past 6 months No    Has the patient had a decrease in activity level because of a fear of falling?  No    Is the patient reluctant to leave their home because of a fear of falling?  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 The axillary incision has deep scar tissue which may be limiting the lymphatic flow as she has some edema present superior to her incision.      Posture/Postural Control   Posture/Postural Control Postural limitations    Postural Limitations Rounded Shoulders;Forward head      ROM / Strength   AROM / PROM / Strength AROM;Strength      AROM   AROM Assessment Site Shoulder    Right/Left Shoulder Right;Left    Left Shoulder Extension 40 Degrees    Left Shoulder Flexion 131 Degrees    Left Shoulder ABduction 152 Degrees    Left Shoulder Internal Rotation 78 Degrees    Left Shoulder External Rotation 80 Degrees               LYMPHEDEMA/ONCOLOGY QUESTIONNAIRE - 04/10/21 0001       Type   Cancer Type Left breast      Surgeries   Lumpectomy Date 03/12/21    Sentinel Lymph Node Biopsy Date 03/12/21    Number Lymph Nodes Removed 6      Treatment   Active Chemotherapy Treatment Yes    Date --   10/18/2020 - 02/27/2021 but continues with Herceptin   Active Radiation Treatment No    Past Radiation Treatment No    Current Hormone Treatment No    Past Hormone Therapy No      What other symptoms do you have   Are you Having Heaviness or Tightness No    Are you having Pain Yes    Are you having pitting edema No    Is it Hard or Difficult finding clothes that fit No    Do you have infections No    Is there Decreased scar mobility Yes    Stemmer Sign No      Lymphedema Assessments   Lymphedema Assessments Upper extremities      Right Upper Extremity Lymphedema   10 cm Proximal to Olecranon Process 29.5 cm    Olecranon Process 25.4 cm    10 cm  Proximal to Ulnar Styloid Process 23.1 cm    Just Proximal to Ulnar Styloid Process 16.4 cm    Across Hand at [employer] 18.7  cm    At [facility] of 2nd Digit 6.2 cm      Left Upper Extremity Lymphedema   10 cm Proximal to Olecranon Process 29.8 cm    Olecranon Process 24.8 cm    10 cm Proximal to Ulnar Styloid Process 22.9 cm    Just Proximal to Ulnar Styloid Process 16.2 cm    Across Hand at [employer] 17.6 cm    At [facility] of 2nd Digit 6.1 cm             L-DEX FLOWSHEETS - 04/10/21 1000       L-DEX LYMPHEDEMA SCREENING   Measurement Type Unilateral   Measurement taken at baseline eval   L-DEX MEASUREMENT EXTREMITY Upper Extremity    POSITION  Standing    DOMINANT SIDE Right    At Risk Side Left    BASELINE SCORE (UNILATERAL) -1.1               Quick Dash - 04/10/21 0001     Open a tight or new jar Mild difficulty    Do heavy household chores (wash walls, wash floors) Mild difficulty    Carry a shopping bag or briefcase No difficulty    Wash your back Mild difficulty    Use a knife to cut food No difficulty    Recreational activities in which you take some force or impact through your arm, shoulder, or hand (golf, hammering, tennis) No difficulty    During the past week, to what extent has your arm, shoulder or hand problem interfered with your normal social activities with family, friends, neighbors, or groups? Not at all    During the past week, to what extent has your arm, shoulder or hand problem limited your work or other regular daily activities Not at all    Arm, shoulder, or hand pain. Mild    Tingling (pins and needles) in your arm, shoulder, or hand Mild    Difficulty Sleeping No difficulty    DASH Score 11.36 %                             PT Education - 04/10/21 1040     Education Details Lymphedema risk and HEP    Person(s) Educated Patient    Methods Explanation;Demonstration;Handout    Comprehension Returned  demonstration;Verbalized understanding                 PT Long Term Goals - 04/10/21 1048       PT LONG TERM GOAL #1   Title Patient will demonstrate she has regained full shoulder ROM and function post operatively compared to baselines.    Time 6    Period Months    Status Achieved                   Plan - 04/10/21 1042     Clinical Impression Statement Patient is healing well since her left lumpectomy and sentinel node biopsy (6 negative nodes) on 03/12/2021. She has regained shoulder ROM and function, shows no signs of lymphedema, and is healing normally. She has no need for PT but plans to attend the After Breast Cancer class on 05/05/2021 and return for regular SOZO screens..    PT Treatment/Interventions ADLs/Self Care Home Management;Therapeutic exercise;Patient/family education    PT Next Visit Plan D/C    PT Home Exercise Plan Post op HEP    Consulted and Agree with Plan of  Care Patient             Patient will benefit from skilled therapeutic intervention in order to improve the following deficits and impairments:  Postural dysfunction, Decreased range of motion, Decreased knowledge of precautions, Impaired UE functional use, Pain  Visit Diagnosis: Malignant neoplasm of lower-outer quadrant of left breast of female, estrogen receptor positive (HCC)  Abnormal posture  Aftercare following surgery for neoplasm     Problem List Patient Active Problem List   Diagnosis Date Noted   Port-A-Cath in place 12/20/2020   Genetic testing 10/23/2020   Family history of breast cancer    Family history of prostate cancer    Family history of stomach cancer    Family history of leukemia    Malignant neoplasm of lower-outer quadrant of left breast of female, estrogen receptor positive ([place]) 10/03/2020   PHYSICAL THERAPY DISCHARGE SUMMARY  Visits from Start of Care: 2  Current functional level related to goals / functional outcomes: Goals met. See above  for measurements.   Remaining deficits: Mild edema present just superior to her axillary incision.   Education / Equipment: Lymphedema education and post op HEP  Patient agrees to discharge. Patient goals were met. Patient is being discharged due to meeting the stated rehab goals.   Annia Friendly, [place] 04/10/21 10:50 AM   [facility] @ [place], [place], [address] Phone: [phone]   Fax:  [phone]  Name: Tammie [name] MRN: [id number] Date of Birth: 19-May-1976

## 2021-04-10 NOTE — Patient Instructions (Addendum)
     [facility]  [address], [address]  [place] [address]  [phone]  After Breast Cancer Class It is recommended you attend the ABC class to be educated on lymphedema risk reduction. This class is free of charge and lasts for 1 hour. It is a 1-time class. You are scheduled for November 21st at 11:00. We will send you a link and you will log in using the Webex app.  Scar massage You can begin gentle scar massage on both incisions followed by rubbing coconut oil into the scars. Don't put coconut oil on before going to radiation.  Compression garment Wearing a sports bra that come up a bit higher into your armpit will help reduce your swelling.  Home exercise Program Continue doing the stretches I gave you until you can do them without feeling any tightness.  Follow up PT: It is recommended you return every 3 months for the first 3 years following surgery to be assessed on the SOZO machine for an L-Dex score. This helps prevent clinically significant lymphedema in 95% of patients. These follow up screens are 10 minute appointments that you are not billed for. You are scheduled for December 27th, 2022 at 9:20am.

## 2021-04-11 ENCOUNTER — Encounter: Payer: Self-pay | Admitting: *Deleted

## 2021-04-14 DIAGNOSIS — C50512 Malignant neoplasm of lower-outer quadrant of left female breast: Secondary | ICD-10-CM | POA: Diagnosis not present

## 2021-04-15 ENCOUNTER — Other Ambulatory Visit: Payer: Self-pay

## 2021-04-15 ENCOUNTER — Ambulatory Visit
Admission: RE | Admit: 2021-04-15 | Discharge: 2021-04-15 | Disposition: A | Discharge status: Home / Self Care | Payer: 59 | Source: Ambulatory Visit | Attending: Radiation Oncology | Admitting: Radiation Oncology

## 2021-04-15 DIAGNOSIS — Z803 Family history of malignant neoplasm of breast: Secondary | ICD-10-CM | POA: Diagnosis not present

## 2021-04-15 DIAGNOSIS — C50512 Malignant neoplasm of lower-outer quadrant of left female breast: Secondary | ICD-10-CM

## 2021-04-15 DIAGNOSIS — Z79899 Other long term (current) drug therapy: Secondary | ICD-10-CM | POA: Diagnosis not present

## 2021-04-15 DIAGNOSIS — Z170 Estrogen receptor positive status [ER+]: Secondary | ICD-10-CM | POA: Insufficient documentation

## 2021-04-15 DIAGNOSIS — Z5111 Encounter for antineoplastic chemotherapy: Secondary | ICD-10-CM | POA: Diagnosis not present

## 2021-04-15 MED ORDER — RADIAPLEXRX EX GEL: Freq: Once | CUTANEOUS | Status: AC

## 2021-04-15 MED ORDER — ALRA NON-METALLIC DEODORANT (RAD-ONC)
1.0000 "application " | Freq: Once | TOPICAL | Status: AC
  Administered 2021-04-15: 1 via TOPICAL

## 2021-04-15 NOTE — Progress Notes (Signed)

## 2021-04-16 ENCOUNTER — Ambulatory Visit
Admission: RE | Admit: 2021-04-16 | Discharge: 2021-04-16 | Disposition: A | Discharge status: Home / Self Care | Payer: 59 | Source: Ambulatory Visit | Attending: Radiation Oncology | Admitting: Radiation Oncology

## 2021-04-16 DIAGNOSIS — Z5111 Encounter for antineoplastic chemotherapy: Secondary | ICD-10-CM | POA: Diagnosis not present

## 2021-04-17 ENCOUNTER — Other Ambulatory Visit: Payer: Self-pay

## 2021-04-17 ENCOUNTER — Ambulatory Visit
Admission: RE | Admit: 2021-04-17 | Discharge: 2021-04-17 | Disposition: A | Discharge status: Home / Self Care | Payer: 59 | Source: Ambulatory Visit | Attending: Radiation Oncology | Admitting: Radiation Oncology

## 2021-04-17 DIAGNOSIS — Z5111 Encounter for antineoplastic chemotherapy: Secondary | ICD-10-CM | POA: Diagnosis not present

## 2021-04-18 ENCOUNTER — Ambulatory Visit
Admission: RE | Admit: 2021-04-18 | Discharge: 2021-04-18 | Disposition: A | Discharge status: Home / Self Care | Payer: 59 | Source: Ambulatory Visit | Attending: Radiation Oncology | Admitting: Radiation Oncology

## 2021-04-18 ENCOUNTER — Inpatient Hospital Stay: Payer: 59 | Attending: Hematology and Oncology

## 2021-04-18 VITALS — BP 114/61 | HR 81 | Temp 98.1°F | Resp 19 | Wt 148.2 lb

## 2021-04-18 DIAGNOSIS — C50512 Malignant neoplasm of lower-outer quadrant of left female breast: Secondary | ICD-10-CM | POA: Insufficient documentation

## 2021-04-18 DIAGNOSIS — Z5111 Encounter for antineoplastic chemotherapy: Secondary | ICD-10-CM | POA: Insufficient documentation

## 2021-04-18 DIAGNOSIS — Z803 Family history of malignant neoplasm of breast: Secondary | ICD-10-CM | POA: Insufficient documentation

## 2021-04-18 DIAGNOSIS — Z79899 Other long term (current) drug therapy: Secondary | ICD-10-CM | POA: Insufficient documentation

## 2021-04-18 DIAGNOSIS — Z170 Estrogen receptor positive status [ER+]: Secondary | ICD-10-CM | POA: Insufficient documentation

## 2021-04-18 MED ORDER — SODIUM CHLORIDE 0.9% FLUSH
10.0000 mL | INTRAVENOUS | Status: DC | PRN
  Administered 2021-04-18: 10 mL

## 2021-04-18 MED ORDER — SODIUM CHLORIDE 0.9 % IV SOLN
420.0000 mg | Freq: Once | INTRAVENOUS | Status: AC
  Administered 2021-04-18: 420 mg via INTRAVENOUS
  Filled 2021-04-18: qty 14

## 2021-04-18 MED ORDER — HEPARIN SOD (PORK) LOCK FLUSH 100 UNIT/ML IV SOLN
500.0000 [IU] | Freq: Once | INTRAVENOUS | Status: AC | PRN
  Administered 2021-04-18: 500 [IU]

## 2021-04-18 MED ORDER — TRASTUZUMAB-ANNS CHEMO 150 MG IV SOLR
6.0000 mg/kg | Freq: Once | INTRAVENOUS | Status: AC
  Administered 2021-04-18: 399 mg via INTRAVENOUS
  Filled 2021-04-18: qty 19

## 2021-04-18 MED ORDER — SODIUM CHLORIDE 0.9 % IV SOLN: Freq: Once | INTRAVENOUS | Status: AC

## 2021-04-18 MED ORDER — ACETAMINOPHEN 325 MG PO TABS
650.0000 mg | ORAL_TABLET | Freq: Once | ORAL | Status: AC
  Administered 2021-04-18: 650 mg via ORAL
  Filled 2021-04-18: qty 2

## 2021-04-18 MED ORDER — DIPHENHYDRAMINE HCL 25 MG PO CAPS
25.0000 mg | ORAL_CAPSULE | Freq: Once | ORAL | Status: AC
  Administered 2021-04-18: 25 mg via ORAL
  Filled 2021-04-18: qty 1

## 2021-04-18 NOTE — Patient Instructions (Addendum)
[name]  Discharge Instructions: Thank you for choosing [facility] to provide your oncology and hematology care.   If you have a lab appointment with the [facility], please go directly to the [facility] and check in at the registration area.   Wear comfortable clothing and clothing appropriate for easy access to any Portacath or PICC line.   We strive to give you quality time with your provider. You may need to reschedule your appointment if you arrive late (15 or more minutes).  Arriving late affects you and other patients whose appointments are after yours.  Also, if you miss three or more appointments without notifying the office, you may be dismissed from the clinic at the provider's discretion.      For prescription refill requests, have your pharmacy contact our office and allow 72 hours for refills to be completed.    Today you received the following chemotherapy and/or immunotherapy agents herceptin, perjeta      To help prevent nausea and vomiting after your treatment, we encourage you to take your nausea medication as directed.  BELOW ARE SYMPTOMS THAT SHOULD BE REPORTED IMMEDIATELY: *FEVER GREATER THAN 100.4 F (38 C) OR HIGHER *CHILLS OR SWEATING *NAUSEA AND VOMITING THAT IS NOT CONTROLLED WITH YOUR NAUSEA MEDICATION *UNUSUAL SHORTNESS OF BREATH *UNUSUAL BRUISING OR BLEEDING *URINARY PROBLEMS (pain or burning when urinating, or frequent urination) *BOWEL PROBLEMS (unusual diarrhea, constipation, pain near the anus) TENDERNESS IN MOUTH AND THROAT WITH OR WITHOUT PRESENCE OF ULCERS (sore throat, sores in mouth, or a toothache) UNUSUAL RASH, SWELLING OR PAIN  UNUSUAL VAGINAL DISCHARGE OR ITCHING   Items with * indicate a potential emergency and should be followed up as soon as possible or go to the Emergency Department if any problems should occur.  Please show the CHEMOTHERAPY ALERT CARD or IMMUNOTHERAPY ALERT CARD at  check-in to the Emergency Department and triage nurse.  Should you have questions after your visit or need to cancel or reschedule your appointment, please contact [facility]  Dept: [phone]  and follow the prompts.  Office hours are 8:00 a.m. to 4:30 p.m. Monday - Friday. Please note that voicemails left after 4:00 p.m. may not be returned until the following business day.  We are closed weekends and major holidays. You have access to a nurse at all times for urgent questions. Please call the main number to the clinic Dept: [phone] and follow the prompts.   For any non-urgent questions, you may also contact your provider using MyChart. We now offer e-Visits for anyone 72 and older to request care online for non-urgent symptoms. For details visit [email or web address].   Also download the MyChart app! Go to the app store, search "MyChart", open the app, select [facility], and log in with your MyChart username and password.  Due to Covid, a mask is required upon entering the hospital/clinic. If you do not have a mask, one will be given to you upon arrival. For doctor visits, patients may have 1 support person aged 55 or older with them. For treatment visits, patients cannot have anyone with them due to current Covid guidelines and our immunocompromised population.  [facility] ONCOLOGY  Discharge Instructions: Thank you for choosing [name] to provide your oncology and hematology care.   If you have a lab appointment with the [facility], please go directly to the [facility] and check in at the registration  area.   Wear comfortable clothing and clothing appropriate for easy access to any Portacath or PICC line.   We strive to give you quality time with your provider. You may need to reschedule your appointment if you arrive late (15 or more minutes).  Arriving late affects you and other patients whose appointments  are after yours.  Also, if you miss three or more appointments without notifying the office, you may be dismissed from the clinic at the provider's discretion.      For prescription refill requests, have your pharmacy contact our office and allow 72 hours for refills to be completed.    Today you received the following chemotherapy and/or immunotherapy agents: Trastuzumab and pertuzumab      To help prevent nausea and vomiting after your treatment, we encourage you to take your nausea medication as directed.  BELOW ARE SYMPTOMS THAT SHOULD BE REPORTED IMMEDIATELY: *FEVER GREATER THAN 100.4 F (38 C) OR HIGHER *CHILLS OR SWEATING *NAUSEA AND VOMITING THAT IS NOT CONTROLLED WITH YOUR NAUSEA MEDICATION *UNUSUAL SHORTNESS OF BREATH *UNUSUAL BRUISING OR BLEEDING *URINARY PROBLEMS (pain or burning when urinating, or frequent urination) *BOWEL PROBLEMS (unusual diarrhea, constipation, pain near the anus) TENDERNESS IN MOUTH AND THROAT WITH OR WITHOUT PRESENCE OF ULCERS (sore throat, sores in mouth, or a toothache) UNUSUAL RASH, SWELLING OR PAIN  UNUSUAL VAGINAL DISCHARGE OR ITCHING   Items with * indicate a potential emergency and should be followed up as soon as possible or go to the Emergency Department if any problems should occur.  Please show the CHEMOTHERAPY ALERT CARD or IMMUNOTHERAPY ALERT CARD at check-in to the Emergency Department and triage nurse.  Should you have questions after your visit or need to cancel or reschedule your appointment, please contact [name]  Dept: [phone]  and follow the prompts.  Office hours are 8:00 a.m. to 4:30 p.m. Monday - Friday. Please note that voicemails left after 4:00 p.m. may not be returned until the following business day.  We are closed weekends and major holidays. You have access to a nurse at all times for urgent questions. Please call the main number to the clinic Dept: [phone] and follow the  prompts.   For any non-urgent questions, you may also contact your provider using MyChart. We now offer e-Visits for anyone 30 and older to request care online for non-urgent symptoms. For details visit [email or web address].   Also download the MyChart app! Go to the app store, search "MyChart", open the app, select [facility], and log in with your MyChart username and password.  Due to Covid, a mask is required upon entering the hospital/clinic. If you do not have a mask, one will be given to you upon arrival. For doctor visits, patients may have 1 support person aged 85 or older with them. For treatment visits, patients cannot have anyone with them due to current Covid guidelines and our immunocompromised population.

## 2021-04-21 ENCOUNTER — Other Ambulatory Visit: Payer: Self-pay

## 2021-04-21 ENCOUNTER — Ambulatory Visit
Admission: RE | Admit: 2021-04-21 | Discharge: 2021-04-21 | Disposition: A | Discharge status: Home / Self Care | Payer: 59 | Source: Ambulatory Visit | Attending: Radiation Oncology | Admitting: Radiation Oncology

## 2021-04-21 DIAGNOSIS — Z5111 Encounter for antineoplastic chemotherapy: Secondary | ICD-10-CM | POA: Diagnosis not present

## 2021-04-22 ENCOUNTER — Ambulatory Visit
Admission: RE | Admit: 2021-04-22 | Discharge: 2021-04-22 | Disposition: A | Discharge status: Home / Self Care | Payer: 59 | Source: Ambulatory Visit | Attending: Radiation Oncology | Admitting: Radiation Oncology

## 2021-04-22 DIAGNOSIS — Z5111 Encounter for antineoplastic chemotherapy: Secondary | ICD-10-CM | POA: Diagnosis not present

## 2021-04-23 ENCOUNTER — Other Ambulatory Visit: Payer: Self-pay

## 2021-04-23 ENCOUNTER — Ambulatory Visit
Admission: RE | Admit: 2021-04-23 | Discharge: 2021-04-23 | Disposition: A | Discharge status: Home / Self Care | Payer: 59 | Source: Ambulatory Visit | Attending: Radiation Oncology | Admitting: Radiation Oncology

## 2021-04-23 DIAGNOSIS — Z5111 Encounter for antineoplastic chemotherapy: Secondary | ICD-10-CM | POA: Diagnosis not present

## 2021-04-24 ENCOUNTER — Ambulatory Visit
Admission: RE | Admit: 2021-04-24 | Discharge: 2021-04-24 | Disposition: A | Discharge status: Home / Self Care | Payer: 59 | Source: Ambulatory Visit | Attending: Radiation Oncology | Admitting: Radiation Oncology

## 2021-04-24 DIAGNOSIS — Z5111 Encounter for antineoplastic chemotherapy: Secondary | ICD-10-CM | POA: Diagnosis not present

## 2021-04-25 ENCOUNTER — Other Ambulatory Visit: Payer: Self-pay

## 2021-04-25 ENCOUNTER — Ambulatory Visit (HOSPITAL_COMMUNITY)
Admission: RE | Admit: 2021-04-25 | Discharge: 2021-04-25 | Disposition: A | Discharge status: Home / Self Care | Payer: 59 | Source: Ambulatory Visit | Attending: Hematology and Oncology | Admitting: Hematology and Oncology

## 2021-04-25 ENCOUNTER — Ambulatory Visit
Admission: RE | Admit: 2021-04-25 | Discharge: 2021-04-25 | Disposition: A | Discharge status: Home / Self Care | Payer: 59 | Source: Ambulatory Visit | Attending: Radiation Oncology | Admitting: Radiation Oncology

## 2021-04-25 DIAGNOSIS — Z170 Estrogen receptor positive status [ER+]: Secondary | ICD-10-CM | POA: Diagnosis not present

## 2021-04-25 DIAGNOSIS — Z01818 Encounter for other preprocedural examination: Secondary | ICD-10-CM | POA: Insufficient documentation

## 2021-04-25 DIAGNOSIS — Z5111 Encounter for antineoplastic chemotherapy: Secondary | ICD-10-CM | POA: Diagnosis not present

## 2021-04-25 DIAGNOSIS — C50512 Malignant neoplasm of lower-outer quadrant of left female breast: Secondary | ICD-10-CM | POA: Diagnosis not present

## 2021-04-25 DIAGNOSIS — Z0189 Encounter for other specified special examinations: Secondary | ICD-10-CM

## 2021-04-25 LAB — ECHOCARDIOGRAM COMPLETE
Area-P 1/2: 4.21 cm2
Calc EF: 52 %
S' Lateral: 3.4 cm
Single Plane A2C EF: 55.5 %
Single Plane A4C EF: 48.7 %

## 2021-04-25 NOTE — Progress Notes (Signed)
  Echocardiogram 2D Echocardiogram has been performed.  Tammie [name] 04/25/2021, 9:48 AM

## 2021-04-28 ENCOUNTER — Other Ambulatory Visit: Payer: Self-pay

## 2021-04-28 ENCOUNTER — Ambulatory Visit
Admission: RE | Admit: 2021-04-28 | Discharge: 2021-04-28 | Disposition: A | Discharge status: Home / Self Care | Payer: 59 | Source: Ambulatory Visit | Attending: Radiation Oncology | Admitting: Radiation Oncology

## 2021-04-28 DIAGNOSIS — Z5111 Encounter for antineoplastic chemotherapy: Secondary | ICD-10-CM | POA: Diagnosis not present

## 2021-04-29 ENCOUNTER — Ambulatory Visit
Admission: RE | Admit: 2021-04-29 | Discharge: 2021-04-29 | Disposition: A | Discharge status: Home / Self Care | Payer: 59 | Source: Ambulatory Visit | Attending: Radiation Oncology | Admitting: Radiation Oncology

## 2021-04-29 DIAGNOSIS — Z5111 Encounter for antineoplastic chemotherapy: Secondary | ICD-10-CM | POA: Diagnosis not present

## 2021-04-30 ENCOUNTER — Other Ambulatory Visit: Payer: Self-pay

## 2021-04-30 ENCOUNTER — Ambulatory Visit
Admission: RE | Admit: 2021-04-30 | Discharge: 2021-04-30 | Disposition: A | Discharge status: Home / Self Care | Payer: 59 | Source: Ambulatory Visit | Attending: Radiation Oncology | Admitting: Radiation Oncology

## 2021-04-30 DIAGNOSIS — Z5111 Encounter for antineoplastic chemotherapy: Secondary | ICD-10-CM | POA: Diagnosis not present

## 2021-05-01 ENCOUNTER — Ambulatory Visit
Admission: RE | Admit: 2021-05-01 | Discharge: 2021-05-01 | Disposition: A | Discharge status: Home / Self Care | Payer: 59 | Source: Ambulatory Visit | Attending: Radiation Oncology | Admitting: Radiation Oncology

## 2021-05-01 ENCOUNTER — Other Ambulatory Visit: Payer: Self-pay

## 2021-05-01 DIAGNOSIS — Z5111 Encounter for antineoplastic chemotherapy: Secondary | ICD-10-CM | POA: Diagnosis not present

## 2021-05-02 ENCOUNTER — Ambulatory Visit
Admission: RE | Admit: 2021-05-02 | Discharge: 2021-05-02 | Disposition: A | Discharge status: Home / Self Care | Payer: 59 | Source: Ambulatory Visit | Attending: Radiation Oncology | Admitting: Radiation Oncology

## 2021-05-02 DIAGNOSIS — Z5111 Encounter for antineoplastic chemotherapy: Secondary | ICD-10-CM | POA: Diagnosis not present

## 2021-05-05 ENCOUNTER — Ambulatory Visit: Admission: RE | Admit: 2021-05-05 | Payer: 59 | Source: Ambulatory Visit

## 2021-05-05 ENCOUNTER — Other Ambulatory Visit: Payer: Self-pay

## 2021-05-06 ENCOUNTER — Ambulatory Visit: Payer: 59

## 2021-05-07 ENCOUNTER — Ambulatory Visit: Payer: 59

## 2021-05-09 ENCOUNTER — Inpatient Hospital Stay: Payer: 59

## 2021-05-09 ENCOUNTER — Other Ambulatory Visit: Payer: Self-pay

## 2021-05-09 VITALS — BP 117/84 | HR 77 | Temp 97.7°F | Resp 17 | Wt 149.5 lb

## 2021-05-09 DIAGNOSIS — Z95828 Presence of other vascular implants and grafts: Secondary | ICD-10-CM

## 2021-05-09 DIAGNOSIS — C50512 Malignant neoplasm of lower-outer quadrant of left female breast: Secondary | ICD-10-CM

## 2021-05-09 DIAGNOSIS — Z5111 Encounter for antineoplastic chemotherapy: Secondary | ICD-10-CM | POA: Diagnosis not present

## 2021-05-09 LAB — CMP (CANCER CENTER ONLY)
ALT: 16 U/L (ref 0–44)
AST: 16 U/L (ref 15–41)
Albumin: 4.2 g/dL (ref 3.5–5.0)
Alkaline Phosphatase: 50 U/L (ref 38–126)
Anion gap: 9 (ref 5–15)
BUN: 16 mg/dL (ref 6–20)
CO2: 26 mmol/L (ref 22–32)
Calcium: 9.5 mg/dL (ref 8.9–10.3)
Chloride: 104 mmol/L (ref 98–111)
Creatinine: 0.72 mg/dL (ref 0.44–1.00)
GFR, Estimated: >60 mL/min (ref >60)
Glucose, Bld: 96 mg/dL (ref 70–99)
Potassium: 3.8 mmol/L (ref 3.5–5.1)
Sodium: 139 mmol/L (ref 135–145)
Total Bilirubin: 0.2 mg/dL — ABNORMAL LOW (ref 0.3–1.2)
Total Protein: 7.1 g/dL (ref 6.5–8.1)

## 2021-05-09 LAB — CBC WITH DIFFERENTIAL (CANCER CENTER ONLY)
Abs Immature Granulocytes: 0.01 10*3/uL (ref 0.00–0.07)
Basophils Absolute: 0 10*3/uL (ref 0.0–0.1)
Basophils Relative: 0 %
Eosinophils Absolute: 0.1 10*3/uL (ref 0.0–0.5)
Eosinophils Relative: 3 %
HCT: 34 % — ABNORMAL LOW (ref 36.0–46.0)
Hemoglobin: 11.4 g/dL — ABNORMAL LOW (ref 12.0–15.0)
Immature Granulocytes: 0 %
Lymphocytes Relative: 25 %
Lymphs Abs: 1.1 10*3/uL (ref 0.7–4.0)
MCH: 29.8 pg (ref 26.0–34.0)
MCHC: 33.5 g/dL (ref 30.0–36.0)
MCV: 88.8 fL (ref 80.0–100.0)
Monocytes Absolute: 0.5 10*3/uL (ref 0.1–1.0)
Monocytes Relative: 11 %
Neutro Abs: 2.8 10*3/uL (ref 1.7–7.7)
Neutrophils Relative %: 61 %
Platelet Count: 176 10*3/uL (ref 150–400)
RBC: 3.83 MIL/uL — ABNORMAL LOW (ref 3.87–5.11)
RDW: 14 % (ref 11.5–15.5)
WBC Count: 4.5 10*3/uL (ref 4.0–10.5)
nRBC: 0 % (ref 0.0–0.2)

## 2021-05-09 LAB — PREGNANCY, URINE: Preg Test, Ur: NEGATIVE

## 2021-05-09 MED ORDER — SODIUM CHLORIDE 0.9% FLUSH
10.0000 mL | Freq: Once | INTRAVENOUS | Status: AC
  Administered 2021-05-09: 10 mL

## 2021-05-09 MED ORDER — SODIUM CHLORIDE 0.9 % IV SOLN
420.0000 mg | Freq: Once | INTRAVENOUS | Status: AC
  Administered 2021-05-09: 420 mg via INTRAVENOUS
  Filled 2021-05-09: qty 14

## 2021-05-09 MED ORDER — HEPARIN SOD (PORK) LOCK FLUSH 100 UNIT/ML IV SOLN
500.0000 [IU] | Freq: Once | INTRAVENOUS | Status: AC | PRN
  Administered 2021-05-09: 500 [IU]

## 2021-05-09 MED ORDER — SODIUM CHLORIDE 0.9% FLUSH
10.0000 mL | INTRAVENOUS | Status: DC | PRN
  Administered 2021-05-09: 10 mL

## 2021-05-09 MED ORDER — DIPHENHYDRAMINE HCL 25 MG PO CAPS
25.0000 mg | ORAL_CAPSULE | Freq: Once | ORAL | Status: AC
  Administered 2021-05-09: 25 mg via ORAL
  Filled 2021-05-09: qty 1

## 2021-05-09 MED ORDER — TRASTUZUMAB-ANNS CHEMO 150 MG IV SOLR
6.0000 mg/kg | Freq: Once | INTRAVENOUS | Status: AC
  Administered 2021-05-09: 399 mg via INTRAVENOUS
  Filled 2021-05-09: qty 19

## 2021-05-09 MED ORDER — ACETAMINOPHEN 325 MG PO TABS
650.0000 mg | ORAL_TABLET | Freq: Once | ORAL | Status: AC
  Administered 2021-05-09: 650 mg via ORAL
  Filled 2021-05-09: qty 2

## 2021-05-09 MED ORDER — SODIUM CHLORIDE 0.9 % IV SOLN: Freq: Once | INTRAVENOUS | Status: AC

## 2021-05-09 NOTE — Patient Instructions (Signed)
[facility] ONCOLOGY  Discharge Instructions: Thank you for choosing [facility] to provide your oncology and hematology care.   If you have a lab appointment with the [facility], please go directly to the [facility] and check in at the registration area.   Wear comfortable clothing and clothing appropriate for easy access to any Portacath or PICC line.   We strive to give you quality time with your provider. You may need to reschedule your appointment if you arrive late (15 or more minutes).  Arriving late affects you and other patients whose appointments are after yours.  Also, if you miss three or more appointments without notifying the office, you may be dismissed from the clinic at the provider's discretion.      For prescription refill requests, have your pharmacy contact our office and allow 72 hours for refills to be completed.    Today you received the following chemotherapy and/or immunotherapy agents: Trastuzumab/Pertuzumab      To help prevent nausea and vomiting after your treatment, we encourage you to take your nausea medication as directed.  BELOW ARE SYMPTOMS THAT SHOULD BE REPORTED IMMEDIATELY: *FEVER GREATER THAN 100.4 F (38 C) OR HIGHER *CHILLS OR SWEATING *NAUSEA AND VOMITING THAT IS NOT CONTROLLED WITH YOUR NAUSEA MEDICATION *UNUSUAL SHORTNESS OF BREATH *UNUSUAL BRUISING OR BLEEDING *URINARY PROBLEMS (pain or burning when urinating, or frequent urination) *BOWEL PROBLEMS (unusual diarrhea, constipation, pain near the anus) TENDERNESS IN MOUTH AND THROAT WITH OR WITHOUT PRESENCE OF ULCERS (sore throat, sores in mouth, or a toothache) UNUSUAL RASH, SWELLING OR PAIN  UNUSUAL VAGINAL DISCHARGE OR ITCHING   Items with * indicate a potential emergency and should be followed up as soon as possible or go to the Emergency Department if any problems should occur.  Please show the CHEMOTHERAPY ALERT CARD or IMMUNOTHERAPY ALERT CARD at  check-in to the Emergency Department and triage nurse.  Should you have questions after your visit or need to cancel or reschedule your appointment, please contact [facility]  Dept: [phone]  and follow the prompts.  Office hours are 8:00 a.m. to 4:30 p.m. Monday - Friday. Please note that voicemails left after 4:00 p.m. may not be returned until the following business day.  We are closed weekends and major holidays. You have access to a nurse at all times for urgent questions. Please call the main number to the clinic Dept: [phone] and follow the prompts.   For any non-urgent questions, you may also contact your provider using MyChart. We now offer e-Visits for anyone 28 and older to request care online for non-urgent symptoms. For details visit [email or web address].   Also download the MyChart app! Go to the app store, search "MyChart", open the app, select [facility], and log in with your MyChart username and password.  Due to Covid, a mask is required upon entering the hospital/clinic. If you do not have a mask, one will be given to you upon arrival. For doctor visits, patients may have 1 support person aged 23 or older with them. For treatment visits, patients cannot have anyone with them due to current Covid guidelines and our immunocompromised population.

## 2021-05-09 NOTE — Progress Notes (Signed)
Patient EF from 11/11 is 53%. Spoke with Arbie Cookey, pharmacist, and she stated that as long as the EF is greater than 50% we can go ahead with treatment. Patient has no c/o of SOB.

## 2021-05-12 ENCOUNTER — Other Ambulatory Visit: Payer: Self-pay

## 2021-05-12 ENCOUNTER — Ambulatory Visit
Admission: RE | Admit: 2021-05-12 | Discharge: 2021-05-12 | Disposition: A | Discharge status: Home / Self Care | Payer: 59 | Source: Ambulatory Visit | Attending: Radiation Oncology | Admitting: Radiation Oncology

## 2021-05-12 DIAGNOSIS — Z5111 Encounter for antineoplastic chemotherapy: Secondary | ICD-10-CM | POA: Diagnosis not present

## 2021-05-13 ENCOUNTER — Ambulatory Visit
Admission: RE | Admit: 2021-05-13 | Discharge: 2021-05-13 | Disposition: A | Discharge status: Home / Self Care | Payer: 59 | Source: Ambulatory Visit | Attending: Radiation Oncology | Admitting: Radiation Oncology

## 2021-05-13 DIAGNOSIS — Z170 Estrogen receptor positive status [ER+]: Secondary | ICD-10-CM

## 2021-05-13 DIAGNOSIS — Z5111 Encounter for antineoplastic chemotherapy: Secondary | ICD-10-CM | POA: Diagnosis not present

## 2021-05-13 DIAGNOSIS — C50512 Malignant neoplasm of lower-outer quadrant of left female breast: Secondary | ICD-10-CM

## 2021-05-13 DIAGNOSIS — Z803 Family history of malignant neoplasm of breast: Secondary | ICD-10-CM

## 2021-05-13 MED ORDER — RADIAPLEXRX EX GEL: Freq: Once | CUTANEOUS | Status: AC

## 2021-05-14 ENCOUNTER — Other Ambulatory Visit: Payer: Self-pay

## 2021-05-14 ENCOUNTER — Ambulatory Visit
Admission: RE | Admit: 2021-05-14 | Discharge: 2021-05-14 | Disposition: A | Discharge status: Home / Self Care | Payer: 59 | Source: Ambulatory Visit | Attending: Radiation Oncology | Admitting: Radiation Oncology

## 2021-05-14 DIAGNOSIS — Z5111 Encounter for antineoplastic chemotherapy: Secondary | ICD-10-CM | POA: Diagnosis not present

## 2021-05-15 ENCOUNTER — Ambulatory Visit
Admission: RE | Admit: 2021-05-15 | Discharge: 2021-05-15 | Disposition: A | Discharge status: Home / Self Care | Payer: 59 | Source: Ambulatory Visit | Attending: Radiation Oncology | Admitting: Radiation Oncology

## 2021-05-15 DIAGNOSIS — C50512 Malignant neoplasm of lower-outer quadrant of left female breast: Secondary | ICD-10-CM | POA: Diagnosis present

## 2021-05-15 DIAGNOSIS — Z95828 Presence of other vascular implants and grafts: Secondary | ICD-10-CM | POA: Diagnosis present

## 2021-05-15 DIAGNOSIS — Z170 Estrogen receptor positive status [ER+]: Secondary | ICD-10-CM | POA: Insufficient documentation

## 2021-05-16 ENCOUNTER — Ambulatory Visit
Admission: RE | Admit: 2021-05-16 | Discharge: 2021-05-16 | Disposition: A | Discharge status: Home / Self Care | Payer: 59 | Source: Ambulatory Visit | Attending: Radiation Oncology | Admitting: Radiation Oncology

## 2021-05-16 ENCOUNTER — Other Ambulatory Visit: Payer: Self-pay

## 2021-05-16 DIAGNOSIS — C50512 Malignant neoplasm of lower-outer quadrant of left female breast: Secondary | ICD-10-CM | POA: Diagnosis not present

## 2021-05-19 ENCOUNTER — Ambulatory Visit
Admission: RE | Admit: 2021-05-19 | Discharge: 2021-05-19 | Disposition: A | Discharge status: Home / Self Care | Payer: 59 | Source: Ambulatory Visit | Attending: Radiation Oncology | Admitting: Radiation Oncology

## 2021-05-19 ENCOUNTER — Other Ambulatory Visit: Payer: Self-pay

## 2021-05-19 DIAGNOSIS — C50512 Malignant neoplasm of lower-outer quadrant of left female breast: Secondary | ICD-10-CM | POA: Diagnosis not present

## 2021-05-20 ENCOUNTER — Ambulatory Visit
Admission: RE | Admit: 2021-05-20 | Discharge: 2021-05-20 | Disposition: A | Discharge status: Home / Self Care | Payer: 59 | Source: Ambulatory Visit | Attending: Radiation Oncology | Admitting: Radiation Oncology

## 2021-05-20 DIAGNOSIS — C50512 Malignant neoplasm of lower-outer quadrant of left female breast: Secondary | ICD-10-CM | POA: Diagnosis not present

## 2021-05-21 ENCOUNTER — Ambulatory Visit: Payer: 59

## 2021-05-22 ENCOUNTER — Other Ambulatory Visit: Payer: Self-pay

## 2021-05-22 ENCOUNTER — Ambulatory Visit
Admission: RE | Admit: 2021-05-22 | Discharge: 2021-05-22 | Disposition: A | Discharge status: Home / Self Care | Payer: 59 | Source: Ambulatory Visit | Attending: Radiation Oncology | Admitting: Radiation Oncology

## 2021-05-22 DIAGNOSIS — C50512 Malignant neoplasm of lower-outer quadrant of left female breast: Secondary | ICD-10-CM | POA: Diagnosis not present

## 2021-05-23 ENCOUNTER — Ambulatory Visit
Admission: RE | Admit: 2021-05-23 | Discharge: 2021-05-23 | Disposition: A | Discharge status: Home / Self Care | Payer: 59 | Source: Ambulatory Visit | Attending: Radiation Oncology | Admitting: Radiation Oncology

## 2021-05-23 DIAGNOSIS — C50512 Malignant neoplasm of lower-outer quadrant of left female breast: Secondary | ICD-10-CM | POA: Diagnosis not present

## 2021-05-26 ENCOUNTER — Other Ambulatory Visit: Payer: Self-pay

## 2021-05-26 ENCOUNTER — Ambulatory Visit
Admission: RE | Admit: 2021-05-26 | Discharge: 2021-05-26 | Disposition: A | Discharge status: Home / Self Care | Payer: 59 | Source: Ambulatory Visit | Attending: Radiation Oncology | Admitting: Radiation Oncology

## 2021-05-26 ENCOUNTER — Ambulatory Visit: Payer: 59

## 2021-05-26 ENCOUNTER — Encounter: Payer: Self-pay | Admitting: *Deleted

## 2021-05-26 DIAGNOSIS — C50512 Malignant neoplasm of lower-outer quadrant of left female breast: Secondary | ICD-10-CM | POA: Diagnosis not present

## 2021-05-27 ENCOUNTER — Ambulatory Visit
Admission: RE | Admit: 2021-05-27 | Discharge: 2021-05-27 | Disposition: A | Discharge status: Home / Self Care | Payer: 59 | Source: Ambulatory Visit | Attending: Radiation Oncology | Admitting: Radiation Oncology

## 2021-05-27 ENCOUNTER — Ambulatory Visit: Payer: 59

## 2021-05-27 ENCOUNTER — Other Ambulatory Visit: Payer: Self-pay

## 2021-05-27 DIAGNOSIS — Z170 Estrogen receptor positive status [ER+]: Secondary | ICD-10-CM

## 2021-05-27 DIAGNOSIS — C50512 Malignant neoplasm of lower-outer quadrant of left female breast: Secondary | ICD-10-CM | POA: Diagnosis not present

## 2021-05-27 MED ORDER — SILVER SULFADIAZINE 1 % EX CREA: TOPICAL_CREAM | Freq: Every day | CUTANEOUS | Status: DC

## 2021-05-28 ENCOUNTER — Ambulatory Visit
Admission: RE | Admit: 2021-05-28 | Discharge: 2021-05-28 | Disposition: A | Discharge status: Home / Self Care | Payer: 59 | Source: Ambulatory Visit | Attending: Radiation Oncology | Admitting: Radiation Oncology

## 2021-05-28 DIAGNOSIS — C50512 Malignant neoplasm of lower-outer quadrant of left female breast: Secondary | ICD-10-CM | POA: Diagnosis not present

## 2021-05-29 ENCOUNTER — Other Ambulatory Visit: Payer: Self-pay

## 2021-05-29 ENCOUNTER — Ambulatory Visit
Admission: RE | Admit: 2021-05-29 | Discharge: 2021-05-29 | Disposition: A | Discharge status: Home / Self Care | Payer: 59 | Source: Ambulatory Visit | Attending: Radiation Oncology | Admitting: Radiation Oncology

## 2021-05-29 ENCOUNTER — Ambulatory Visit: Payer: 59

## 2021-05-29 DIAGNOSIS — C50512 Malignant neoplasm of lower-outer quadrant of left female breast: Secondary | ICD-10-CM | POA: Diagnosis not present

## 2021-05-29 NOTE — Assessment & Plan Note (Signed)
10/01/2020:Screening mammogram showed a left breast mass and calcifications. Diagnostic mammogram and US showed a 1.7cm and 0.5cm mass at the 5 o'clock position in the left breast, with one 0.4cm abnormal right axillary lymph node. 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  Treatment Plan: 1. Neoadjuvant chemotherapy with TCH Perjeta 6 cycles followed by Herceptin Perjeta maintenance for 1 year 2.03/12/2021: Pathologic complete response, 0/6 lymph nodes negative 3. Followed by adjuvant radiation therapy 4.Followed by antiestrogen therapy although ER is weak U[facility] nausea study Breast MRI: 10/17/20:2.4 cm left breast mass, intramammary lymph node ------------------------------------------------------------------------------------------------------------------------- Current treatment:Completed 6 cycles ofTCHP, currently on Herceptin and Perjeta maintenance Based on complete pathologic response, maintenance therapy will be with Herceptin and Perjeta. Radiation appointment for 04/07/2021  RTCevery3 weeks forHerceptin and Perjeta maintenance.Labs and MD visits every 6 weeks

## 2021-05-29 NOTE — Progress Notes (Signed)
Patient Care Team: [name], FNP as PCP - General (Family Medicine) Rockwell Germany, RN as Oncology Nurse Navigator Mauro Kaufmann, RN as Oncology Nurse Navigator Donnie Mesa, MD as Consulting Physician (General Surgery) Nicholas Lose, MD as Consulting Physician (Hematology and Oncology) Gery Pray, MD as Consulting Physician (Radiation Oncology)  DIAGNOSIS:    ICD-10-CM   1. Malignant neoplasm of lower-outer quadrant of left breast of female, estrogen receptor positive ([place])  C50.512    Z17.0       SUMMARY OF ONCOLOGIC HISTORY: Oncology History  Malignant neoplasm of lower-outer quadrant of left breast of female, estrogen receptor positive ([place])  10/01/2020 Initial Diagnosis   Screening mammogram showed a left breast mass and calcifications. Diagnostic mammogram and US showed a 1.7cm and 0.5cm mass at the 5 o'clock position in the left breast, with one 0.4cm abnormal right axillary lymph node. 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   10/09/2020 Cancer Staging   Staging form: Breast, AJCC 8th Edition - Clinical stage from 10/09/2020: Stage IB (cT2, cN1, cM0, G3, ER+, PR+, HER2+) - Signed by Nicholas Lose, MD on 10/09/2020 Stage prefix: Initial diagnosis Histologic grading system: 3 grade system Percentage of positive estrogen receptors (%): 5 Percentage of positive progesterone receptors (%): 1 Ki-67 (%): 20    10/18/2020 - 02/27/2021 Chemotherapy   Patient is on Treatment Plan : BREAST  Docetaxel + Carboplatin + Trastuzumab + Pertuzumab  (TCHP) q21d      10/23/2020 Genetic Testing   Negative genetic testing:  No pathogenic variants detected on the [facility] CancerNext-Expanded + RNAinsight panel. The report date is 10/23/2020.   The CancerNext-Expanded + RNAinsight gene panel offered by [employer] and includes sequencing and rearrangement analysis for the following 77 genes: AIP, ALK, APC, ATM,  AXIN2, BAP1, BARD1, BLM, BMPR1A, BRCA1, BRCA2, BRIP1, CDC73, CDH1, CDK4, CDKN1B, CDKN2A, CHEK2, CTNNA1, DICER1, FANCC, FH, FLCN, GALNT12, KIF1B, LZTR1, MAX, MEN1, MET, MLH1, MSH2, MSH3, MSH6, MUTYH, NBN, NF1, NF2, NTHL1, PALB2, PHOX2B, PMS2, POT1, PRKAR1A, PTCH1, PTEN, RAD51C, RAD51D, RB1, RECQL, RET, SDHA, SDHAF2, SDHB, SDHC, SDHD, SMAD4, SMARCA4, SMARCB1, SMARCE1, STK11, SUFU, TMEM127, TP53, TSC1, TSC2, VHL and XRCC2 (sequencing and deletion/duplication); EGFR, EGLN1, HOXB13, KIT, MITF, PDGFRA, POLD1 and POLE (sequencing only); EPCAM and GREM1 (deletion/duplication only). RNA data is routinely analyzed for use in variant interpretation for all genes.   03/31/2021 -  Chemotherapy   Patient is on Treatment Plan : BREAST Trastuzumab  + Pertuzumab q21d x 13 cycles       CHIEF COMPLIANT: Herceptin and Pertuzumab maintenance  INTERVAL HISTORY: Tammie [name] is a 45 y.o. with above-mentioned history of left breast cancer who completed neoadjuvant chemotherapy. She presents to the clinic today for treatment.  She has no side effects on Herceptin or Perjeta.  She is finishing radiation today and is very excited about that.  ALLERGIES:  has No Known Allergies.  MEDICATIONS:  Current Outpatient Medications  Medication Sig Dispense Refill   ascorbic acid (VITAMIN C) 1000 MG tablet Take by mouth.     Cholecalciferol (DIALYVITE VITAMIN D 5000) 125 MCG (5000 UT) capsule Take 5,000 Units by mouth daily.     citalopram (CELEXA) 20 MG tablet Take 20 mg by mouth daily.     ferrous sulfate 325 (65 FE) MG tablet Take by mouth.     ibuprofen (ADVIL) 800 MG tablet Take 800 mg by mouth 2 (two) times daily.  Magnesium 500 MG CAPS Take 1 capsule (500 mg total) by mouth daily.  0   metoprolol tartrate (LOPRESSOR) 25 MG tablet Take 25 mg by mouth daily.     NON FORMULARY Take 900 mg by mouth daily. Hyaluronic Acid Complex     OVER THE COUNTER MEDICATION Take 4 capsules by mouth daily. Laminine supplement- 620  mg     No current facility-administered medications for this visit.    PHYSICAL EXAMINATION: ECOG PERFORMANCE STATUS: 1 - Symptomatic but completely ambulatory  Vitals:   05/30/21 0836  BP: 125/75  Pulse: 76  Resp: 16  Temp: 97.9 F (36.6 C)  SpO2: 100%   Filed Weights   05/30/21 0836  Weight: 148 lb 11.2 oz (67.4 kg)    LABORATORY DATA:  I have reviewed the data as listed CMP Latest Ref Rng & Units 05/09/2021 03/31/2021 02/27/2021  Glucose 70 - 99 mg/dL 96 110(H) 102(H)  BUN 6 - 20 mg/dL _0 Creatinine 0.44 - 1.00 mg/dL 0.72 0.71 0.67  Sodium 135 - 145 mmol/L 139 142 140  Potassium 3.5 - 5.1 mmol/L 3.8 3.4(L) 3.9  Chloride 98 - 111 mmol/L 104 105 105  CO2 22 - 32 mmol/L _1 Calcium 8.9 - 10.3 mg/dL 9.5 9.9 8.8(L)  Total Protein 6.5 - 8.1 g/dL 7.1 6.9 6.2(L)  Total Bilirubin 0.3 - 1.2 mg/dL 0.2(L) 0.3 <0.2(L)  Alkaline Phos 38 - 126 U/L 50 49 40  AST 15 - 41 U/L 16 15 14(L)  ALT 0 - 44 U/L _2 Lab Results  Component Value Date   WBC 6.1 05/30/2021   HGB 10.9 (L) 05/30/2021   HCT 32.2 (L) 05/30/2021   MCV 86.6 05/30/2021   PLT 198 05/30/2021   NEUTROABS 4.4 05/30/2021    ASSESSMENT & PLAN:  Malignant neoplasm of lower-outer quadrant of left breast of female, estrogen receptor positive ([place]) 10/01/2020:Screening mammogram showed a left breast mass and calcifications. Diagnostic mammogram and US showed a 1.7cm and 0.5cm mass at the 5 o'clock position in the left breast, with one 0.4cm abnormal right axillary lymph node. 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   Treatment Plan: 1. Neoadjuvant chemotherapy with TCH Perjeta 6 cycles followed by Herceptin Perjeta maintenance for 1 year 2. 03/12/2021: Pathologic complete response, 0/6 lymph nodes negative 3. Followed by adjuvant radiation therapy completed 05/30/2021 4.  Followed by antiestrogen therapy although ER is  weak URCC nausea study Breast MRI: 10/17/20: 2.4 cm left breast mass, intramammary lymph node ------------------------------------------------------------------------------------------------------------------------- Current treatment: Completed 6 cycles of TCHP, currently on Herceptin and Perjeta maintenance (will complete June 2023)  Radiation completed 05/30/2021    Herceptin Perjeta toxicities: Echocardiogram 04/25/2021: EF 53% Mild anemia: Today's hemoglobin is 10.9. RTC every 3 weeks for Herceptin and Perjeta maintenance.   Labs and MD visits every 6 weeks    No orders of the defined types were placed in this encounter.  The patient has a good understanding of the overall plan. she agrees with it. she will call with any problems that may develop before the next visit here.  Total time spent: 30 mins including face to face time and time spent for planning, charting and coordination of care  Rulon Eisenmenger, MD, MPH 05/30/2021  I, Thana Ates, am acting as scribe for Dr. Nicholas Lose.  I have reviewed the above documentation for accuracy and completeness,  and I agree with the above.

## 2021-05-30 ENCOUNTER — Ambulatory Visit
Admission: RE | Admit: 2021-05-30 | Discharge: 2021-05-30 | Disposition: A | Discharge status: Home / Self Care | Payer: 59 | Source: Ambulatory Visit | Attending: Radiation Oncology | Admitting: Radiation Oncology

## 2021-05-30 ENCOUNTER — Inpatient Hospital Stay: Payer: 59

## 2021-05-30 ENCOUNTER — Inpatient Hospital Stay (HOSPITAL_BASED_OUTPATIENT_CLINIC_OR_DEPARTMENT_OTHER): Payer: 59 | Admitting: Hematology and Oncology

## 2021-05-30 ENCOUNTER — Encounter: Payer: Self-pay | Admitting: Radiation Oncology

## 2021-05-30 DIAGNOSIS — C50512 Malignant neoplasm of lower-outer quadrant of left female breast: Secondary | ICD-10-CM

## 2021-05-30 DIAGNOSIS — Z5112 Encounter for antineoplastic immunotherapy: Secondary | ICD-10-CM | POA: Insufficient documentation

## 2021-05-30 DIAGNOSIS — Z79899 Other long term (current) drug therapy: Secondary | ICD-10-CM | POA: Insufficient documentation

## 2021-05-30 DIAGNOSIS — Z9221 Personal history of antineoplastic chemotherapy: Secondary | ICD-10-CM | POA: Insufficient documentation

## 2021-05-30 DIAGNOSIS — Z95828 Presence of other vascular implants and grafts: Secondary | ICD-10-CM

## 2021-05-30 DIAGNOSIS — Z170 Estrogen receptor positive status [ER+]: Secondary | ICD-10-CM

## 2021-05-30 LAB — CMP (CANCER CENTER ONLY)
ALT: 12 U/L (ref 0–44)
AST: 14 U/L — ABNORMAL LOW (ref 15–41)
Albumin: 3.6 g/dL (ref 3.5–5.0)
Alkaline Phosphatase: 56 U/L (ref 38–126)
Anion gap: 10 (ref 5–15)
BUN: 13 mg/dL (ref 6–20)
CO2: 26 mmol/L (ref 22–32)
Calcium: 9.5 mg/dL (ref 8.9–10.3)
Chloride: 103 mmol/L (ref 98–111)
Creatinine: 0.71 mg/dL (ref 0.44–1.00)
GFR, Estimated: >60 mL/min (ref >60)
Glucose, Bld: 95 mg/dL (ref 70–99)
Potassium: 4 mmol/L (ref 3.5–5.1)
Sodium: 139 mmol/L (ref 135–145)
Total Bilirubin: 0.2 mg/dL — ABNORMAL LOW (ref 0.3–1.2)
Total Protein: 7.5 g/dL (ref 6.5–8.1)

## 2021-05-30 LAB — CBC WITH DIFFERENTIAL (CANCER CENTER ONLY)
Abs Immature Granulocytes: 0.03 10*3/uL (ref 0.00–0.07)
Basophils Absolute: 0 10*3/uL (ref 0.0–0.1)
Basophils Relative: 0 %
Eosinophils Absolute: 0.2 10*3/uL (ref 0.0–0.5)
Eosinophils Relative: 3 %
HCT: 32.2 % — ABNORMAL LOW (ref 36.0–46.0)
Hemoglobin: 10.9 g/dL — ABNORMAL LOW (ref 12.0–15.0)
Immature Granulocytes: 1 %
Lymphocytes Relative: 14 %
Lymphs Abs: 0.8 10*3/uL (ref 0.7–4.0)
MCH: 29.3 pg (ref 26.0–34.0)
MCHC: 33.9 g/dL (ref 30.0–36.0)
MCV: 86.6 fL (ref 80.0–100.0)
Monocytes Absolute: 0.7 10*3/uL (ref 0.1–1.0)
Monocytes Relative: 11 %
Neutro Abs: 4.4 10*3/uL (ref 1.7–7.7)
Neutrophils Relative %: 71 %
Platelet Count: 198 10*3/uL (ref 150–400)
RBC: 3.72 MIL/uL — ABNORMAL LOW (ref 3.87–5.11)
RDW: 13.8 % (ref 11.5–15.5)
WBC Count: 6.1 10*3/uL (ref 4.0–10.5)
nRBC: 0 % (ref 0.0–0.2)

## 2021-05-30 LAB — PREGNANCY, URINE: Preg Test, Ur: NEGATIVE

## 2021-05-30 MED ORDER — HEPARIN SOD (PORK) LOCK FLUSH 100 UNIT/ML IV SOLN
500.0000 [IU] | Freq: Once | INTRAVENOUS | Status: AC | PRN
  Administered 2021-05-30: 500 [IU]

## 2021-05-30 MED ORDER — SODIUM CHLORIDE 0.9% FLUSH
10.0000 mL | Freq: Once | INTRAVENOUS | Status: AC
  Administered 2021-05-30: 10 mL

## 2021-05-30 MED ORDER — LIDOCAINE-PRILOCAINE 2.5-2.5 % EX CREA: 1.0000 "application " | TOPICAL_CREAM | CUTANEOUS | 0 refills | Status: DC | PRN

## 2021-05-30 MED ORDER — ACETAMINOPHEN 325 MG PO TABS
650.0000 mg | ORAL_TABLET | Freq: Once | ORAL | Status: AC
  Administered 2021-05-30: 650 mg via ORAL
  Filled 2021-05-30: qty 2

## 2021-05-30 MED ORDER — SODIUM CHLORIDE 0.9 % IV SOLN: Freq: Once | INTRAVENOUS | Status: AC

## 2021-05-30 MED ORDER — TRASTUZUMAB-ANNS CHEMO 150 MG IV SOLR
6.0000 mg/kg | Freq: Once | INTRAVENOUS | Status: AC
  Administered 2021-05-30: 399 mg via INTRAVENOUS
  Filled 2021-05-30: qty 19

## 2021-05-30 MED ORDER — SODIUM CHLORIDE 0.9% FLUSH
10.0000 mL | INTRAVENOUS | Status: DC | PRN
  Administered 2021-05-30: 10 mL

## 2021-05-30 MED ORDER — DIPHENHYDRAMINE HCL 25 MG PO CAPS
25.0000 mg | ORAL_CAPSULE | Freq: Once | ORAL | Status: AC
  Administered 2021-05-30: 25 mg via ORAL
  Filled 2021-05-30: qty 1

## 2021-05-30 MED ORDER — SODIUM CHLORIDE 0.9 % IV SOLN
420.0000 mg | Freq: Once | INTRAVENOUS | Status: AC
  Administered 2021-05-30: 420 mg via INTRAVENOUS
  Filled 2021-05-30: qty 14

## 2021-05-30 NOTE — Patient Instructions (Signed)
[facility] ONCOLOGY  Discharge Instructions: Thank you for choosing [facility] to provide your oncology and hematology care.   If you have a lab appointment with the [facility], please go directly to the [facility] and check in at the registration area.   Wear comfortable clothing and clothing appropriate for easy access to any Portacath or PICC line.   We strive to give you quality time with your provider. You may need to reschedule your appointment if you arrive late (15 or more minutes).  Arriving late affects you and other patients whose appointments are after yours.  Also, if you miss three or more appointments without notifying the office, you may be dismissed from the clinic at the providers discretion.      For prescription refill requests, have your pharmacy contact our office and allow 72 hours for refills to be completed.    Today you received the following chemotherapy and/or immunotherapy agents Trastuzumab and perjeta      To help prevent nausea and vomiting after your treatment, we encourage you to take your nausea medication as directed.  BELOW ARE SYMPTOMS THAT SHOULD BE REPORTED IMMEDIATELY: *FEVER GREATER THAN 100.4 F (38 C) OR HIGHER *CHILLS OR SWEATING *NAUSEA AND VOMITING THAT IS NOT CONTROLLED WITH YOUR NAUSEA MEDICATION *UNUSUAL SHORTNESS OF BREATH *UNUSUAL BRUISING OR BLEEDING *URINARY PROBLEMS (pain or burning when urinating, or frequent urination) *BOWEL PROBLEMS (unusual diarrhea, constipation, pain near the anus) TENDERNESS IN MOUTH AND THROAT WITH OR WITHOUT PRESENCE OF ULCERS (sore throat, sores in mouth, or a toothache) UNUSUAL RASH, SWELLING OR PAIN  UNUSUAL VAGINAL DISCHARGE OR ITCHING   Items with * indicate a potential emergency and should be followed up as soon as possible or go to the Emergency Department if any problems should occur.  Please show the CHEMOTHERAPY ALERT CARD or IMMUNOTHERAPY ALERT CARD at  check-in to the Emergency Department and triage nurse.  Should you have questions after your visit or need to cancel or reschedule your appointment, please contact [facility]  Dept: [phone]  and follow the prompts.  Office hours are 8:00 a.m. to 4:30 p.m. Monday - Friday. Please note that voicemails left after 4:00 p.m. may not be returned until the following business day.  We are closed weekends and major holidays. You have access to a nurse at all times for urgent questions. Please call the main number to the clinic Dept: [phone] and follow the prompts.   For any non-urgent questions, you may also contact your provider using MyChart. We now offer e-Visits for anyone 43 and older to request care online for non-urgent symptoms. For details visit [email or web address].   Also download the MyChart app! Go to the app store, search "MyChart", open the app, select [facility], and log in with your MyChart username and password.  Due to Covid, a mask is required upon entering the hospital/clinic. If you do not have a mask, one will be given to you upon arrival. For doctor visits, patients may have 1 support person aged 43 or older with them. For treatment visits, patients cannot have anyone with them due to current Covid guidelines and our immunocompromised population.

## 2021-05-30 NOTE — Progress Notes (Signed)
Pt refused to stay for 30 min observation.  Stating she had to go to radiation and she does not stay for the observation.

## 2021-06-10 ENCOUNTER — Ambulatory Visit: Payer: 59 | Attending: Surgery

## 2021-06-10 ENCOUNTER — Other Ambulatory Visit: Payer: Self-pay

## 2021-06-10 VITALS — Wt 147.1 lb

## 2021-06-10 DIAGNOSIS — Z483 Aftercare following surgery for neoplasm: Secondary | ICD-10-CM | POA: Insufficient documentation

## 2021-06-10 NOTE — Therapy (Signed)
[facility] @ [address] [place], [place], [address] Phone: [phone]   Fax:  [phone]  Physical Therapy Treatment  Patient Details  Name: Tammie [name] MRN: [id number] Date of Birth: 04-Aug-1975 Referring Provider (PT): Dr. Donnie Mesa   Encounter Date: 06/10/2021   PT End of Session - 06/10/21 0928     Visit Number 2   # unchanged due to screen only   PT Start Time 0925    PT Stop Time 0932    PT Time Calculation (min) 7 min    Activity Tolerance Patient tolerated treatment well    Behavior During Therapy [facility] for tasks assessed/performed             Past Medical History:  Diagnosis Date   Anxiety    Arrhythmia    palpitation   Breast cancer ([place])    Depression    Family history of breast cancer    Family history of leukemia    Family history of prostate cancer    Family history of stomach cancer    Fibroid, uterine    Fibromyalgia    Hx of degenerative disc disease    Hypertension    Interstitial cystitis    Microadenoma    Migraines    PONV (postoperative nausea and vomiting)    Pre-diabetes     Past Surgical History:  Procedure Laterality Date   BREAST LUMPECTOMY WITH RADIOACTIVE SEED AND SENTINEL LYMPH NODE BIOPSY Left 03/12/2021   Procedure: LEFT BREAST LUMPECTOMY WITH RADIOACTIVE SEED X2 AND LEFT SENTINEL LYMPH NODE BIOPSY;  Surgeon: Donnie Mesa, MD;  Location: [facility];  Service: General;  Laterality: Left;   CHOLECYSTECTOMY     PORTACATH PLACEMENT Right 10/17/2020   Procedure: INSERTION PORT-A-CATH, ULTRASOUND GUIDED;  Surgeon: Donnie Mesa, MD;  Location: [facility];  Service: General;  Laterality: Right;   RADIOACTIVE SEED GUIDED AXILLARY SENTINEL LYMPH NODE Left 03/12/2021   Procedure: RADIOACTIVE SEED GUIDED LEFT AXILLARY SENTINEL LYMPH NODE BIOPSY;  Surgeon: Donnie Mesa, MD;  Location: [facility];  Service: General;   Laterality: Left;    Vitals:   06/10/21 0926  Weight: 147 lb 2 oz (66.7 kg)     Subjective Assessment - 06/10/21 0926     Subjective Pt returns for her 3 month L-Dex screen.    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 It is ER/PR weakly positive and HER2 positive with a Ki67 of 20%.                    L-DEX FLOWSHEETS - 06/10/21 0900       L-DEX LYMPHEDEMA SCREENING   Measurement Type Unilateral    L-DEX MEASUREMENT EXTREMITY Upper Extremity    POSITION  Standing    DOMINANT SIDE Right    At Risk Side Left    BASELINE SCORE (UNILATERAL) -1.1    L-DEX SCORE (UNILATERAL) 1.5    VALUE CHANGE (UNILAT) 2.6                                     PT Long Term Goals - 04/10/21 1048       PT LONG TERM GOAL #1  Title Patient will demonstrate she has regained full shoulder ROM and function post operatively compared to baselines.    Time 6    Period Months    Status Achieved                   Plan - 06/10/21 0929     Clinical Impression Statement Pt returns for her 3 month L-Dex screen. Her change from baseline of 2.6 is WNLs so no further treatment is required at this time except to cont every 3 month L-Dex screens which pt is agreeable to.    PT Next Visit Plan Cont every 3 month L-Dex screens for up to 2 years from her SLNB (~03/13/2023)    Consulted and Agree with Plan of Care Patient             Patient will benefit from skilled therapeutic intervention in order to improve the following deficits and impairments:     Visit Diagnosis: Aftercare following surgery for neoplasm     Problem List Patient Active Problem List   Diagnosis Date Noted   Port-A-Cath in place 12/20/2020   Genetic testing  10/23/2020   Family history of breast cancer    Family history of prostate cancer    Family history of stomach cancer    Family history of leukemia    Malignant neoplasm of lower-outer quadrant of left breast of female, estrogen receptor positive ([place]) 10/03/2020    Otelia Limes, PTA 06/10/2021, 9:35 AM  [facility] @ [address] [place], [place], [address] Phone: [phone]   Fax:  [phone]  Name: Tammie [name] MRN: [id number] Date of Birth: 08-20-1975

## 2021-06-12 ENCOUNTER — Encounter: Payer: Self-pay | Admitting: Hematology and Oncology

## 2021-06-17 ENCOUNTER — Encounter: Payer: Self-pay | Admitting: Hematology and Oncology

## 2021-06-20 ENCOUNTER — Encounter: Payer: Self-pay | Admitting: *Deleted

## 2021-06-20 ENCOUNTER — Inpatient Hospital Stay: Payer: Commercial Managed Care - HMO

## 2021-06-23 ENCOUNTER — Ambulatory Visit: Payer: Self-pay

## 2021-06-27 ENCOUNTER — Encounter: Payer: Self-pay | Admitting: *Deleted

## 2021-06-27 ENCOUNTER — Other Ambulatory Visit: Payer: Self-pay | Admitting: Surgery

## 2021-06-27 DIAGNOSIS — C50912 Malignant neoplasm of unspecified site of left female breast: Secondary | ICD-10-CM

## 2021-06-27 NOTE — Progress Notes (Signed)
Received message from [facility] team stating pt okay to proceed with Tx on 07/01/21.  States PA team is working on Industrial/product designer and tx payment will be retroactive. Pt notified and verbalized understanding.

## 2021-06-30 ENCOUNTER — Ambulatory Visit: Payer: Managed Care, Other (non HMO)

## 2021-07-01 ENCOUNTER — Inpatient Hospital Stay: Payer: Commercial Managed Care - HMO | Attending: Hematology and Oncology

## 2021-07-01 ENCOUNTER — Other Ambulatory Visit: Payer: Self-pay

## 2021-07-01 VITALS — BP 111/53 | HR 83 | Temp 98.2°F | Resp 16 | Ht 63.0 in | Wt 151.8 lb

## 2021-07-01 DIAGNOSIS — C50512 Malignant neoplasm of lower-outer quadrant of left female breast: Secondary | ICD-10-CM | POA: Diagnosis present

## 2021-07-01 DIAGNOSIS — Z5111 Encounter for antineoplastic chemotherapy: Secondary | ICD-10-CM | POA: Insufficient documentation

## 2021-07-01 DIAGNOSIS — Z170 Estrogen receptor positive status [ER+]: Secondary | ICD-10-CM | POA: Insufficient documentation

## 2021-07-01 DIAGNOSIS — Z79899 Other long term (current) drug therapy: Secondary | ICD-10-CM | POA: Insufficient documentation

## 2021-07-01 LAB — PREGNANCY, URINE: Preg Test, Ur: NEGATIVE

## 2021-07-01 MED ORDER — TRASTUZUMAB-ANNS CHEMO 150 MG IV SOLR
6.0000 mg/kg | Freq: Once | INTRAVENOUS | Status: AC
  Administered 2021-07-01: 399 mg via INTRAVENOUS
  Filled 2021-07-01: qty 19

## 2021-07-01 MED ORDER — ACETAMINOPHEN 325 MG PO TABS
650.0000 mg | ORAL_TABLET | Freq: Once | ORAL | Status: AC
  Administered 2021-07-01: 650 mg via ORAL
  Filled 2021-07-01: qty 2

## 2021-07-01 MED ORDER — DIPHENHYDRAMINE HCL 25 MG PO CAPS
25.0000 mg | ORAL_CAPSULE | Freq: Once | ORAL | Status: AC
  Administered 2021-07-01: 25 mg via ORAL
  Filled 2021-07-01: qty 1

## 2021-07-01 MED ORDER — SODIUM CHLORIDE 0.9% FLUSH
10.0000 mL | INTRAVENOUS | Status: DC | PRN
  Administered 2021-07-01: 10 mL

## 2021-07-01 MED ORDER — HEPARIN SOD (PORK) LOCK FLUSH 100 UNIT/ML IV SOLN
500.0000 [IU] | Freq: Once | INTRAVENOUS | Status: AC | PRN
  Administered 2021-07-01: 500 [IU]

## 2021-07-01 MED ORDER — SODIUM CHLORIDE 0.9 % IV SOLN: Freq: Once | INTRAVENOUS | Status: AC

## 2021-07-01 MED ORDER — SODIUM CHLORIDE 0.9 % IV SOLN
420.0000 mg | Freq: Once | INTRAVENOUS | Status: AC
  Administered 2021-07-01: 420 mg via INTRAVENOUS
  Filled 2021-07-01: qty 14

## 2021-07-01 NOTE — Patient Instructions (Signed)
[facility] ONCOLOG[facility]  Discharge Instructions: Thank you for choosing [facility] to provide your oncology and hematology care.   If you have a lab appointment with the [facility], please go directly to the [facility] and check in at the registration area.   Wear comfortable clothing and clothing appropriate for easy access to any Portacath or PICC line.   We strive to give you quality time with your provider. You may need to reschedule your appointment if you arrive late (15 or more minutes).  Arriving late affects you and other patients whose appointments are after yours.  Also, if you miss three or more appointments without notifying the office, you may be dismissed from the clinic at the providers discretion.      For prescription refill requests, have your pharmacy contact our office and allow 72 hours for refills to be completed.    Today you received the following chemotherapy and/or immunotherapy agents: Trastuzumab and Pertuzumab ( Perjeta).    To help prevent nausea and vomiting after your treatment, we encourage you to take your nausea medication as directed.  BELOW ARE SYMPTOMS THAT SHOULD BE REPORTED IMMEDIATELY: *FEVER GREATER THAN 100.4 F (38 C) OR HIGHER *CHILLS OR SWEATING *NAUSEA AND VOMITING THAT IS NOT CONTROLLED WITH YOUR NAUSEA MEDICATION *UNUSUAL SHORTNESS OF BREATH *UNUSUAL BRUISING OR BLEEDING *URINARY PROBLEMS (pain or burning when urinating, or frequent urination) *BOWEL PROBLEMS (unusual diarrhea, constipation, pain near the anus) TENDERNESS IN MOUTH AND THROAT WITH OR WITHOUT PRESENCE OF ULCERS (sore throat, sores in mouth, or a toothache) UNUSUAL RASH, SWELLING OR PAIN  UNUSUAL VAGINAL DISCHARGE OR ITCHING   Items with * indicate a potential emergency and should be followed up as soon as possible or go to the Emergency Department if any problems should occur.  Please show the CHEMOTHERAPY ALERT CARD or IMMUNOTHERAPY  ALERT CARD at check-in to the Emergency Department and triage nurse.  Should you have questions after your visit or need to cancel or reschedule your appointment, please contact [facility]  Dept: [phone]  and follow the prompts.  Office hours are 8:00 a.m. to 4:30 p.m. Monday - Friday. Please note that voicemails left after 4:00 p.m. may not be returned until the following business day.  We are closed weekends and major holidays. You have access to a nurse at all times for urgent questions. Please call the main number to the clinic Dept: [phone] and follow the prompts.   For any non-urgent questions, you may also contact your provider using MyChart. We now offer e-Visits for anyone 71 and older to request care online for non-urgent symptoms. For details visit [email or web address].   Also download the MyChart app! Go to the app store, search "MyChart", open the app, select [facility], and log in with your MyChart username and password.  Due to Covid, a mask is required upon entering the hospital/clinic. If you do not have a mask, one will be given to you upon arrival. For doctor visits, patients may have 1 support person aged 83 or older with them. For treatment visits, patients cannot have anyone with them due to current Covid guidelines and our immunocompromised population.

## 2021-07-01 NOTE — Progress Notes (Signed)
Patient declined 30 minute post observation period.  VSS.  No s/s or c/o distress or discomfort. 

## 2021-07-02 ENCOUNTER — Encounter: Payer: Self-pay | Admitting: Radiation Oncology

## 2021-07-03 ENCOUNTER — Other Ambulatory Visit (HOSPITAL_COMMUNITY): Payer: Self-pay

## 2021-07-03 ENCOUNTER — Encounter: Payer: Self-pay | Admitting: Hematology and Oncology

## 2021-07-06 NOTE — Progress Notes (Incomplete)
Radiation Oncology         [phone] ________________________________  Patient Name: Tammie [name] MRN: [id number] DOB: 1976/02/26 Referring Physician: [name] Date of Service: 05/30/2021 [facility], [place]                                                        End Of Treatment Note  Diagnoses: C50.512-Malignant neoplasm of lower-outer quadrant of left female breast Z17.0-Estrogen receptor positive status [ER+]  Cancer Staging: Status post left lumpectomy and chemotherapy: Stage IB (cT2, cN1, cM0) Left Breast LOQ, No residual carcinoma, ER+ / PR+ / Her2+ (previously grade 3)  Intent: Curative  Radiation Treatment Dates: 04/15/2021 through 05/30/2021 Site Technique Total Dose (Gy) Dose per Fx (Gy) Completed Fx Beam Energies  Breast, Left: Breast_Lt 3D 50.4/50.4 1.8 28/28 6X  Axilla, Left: Axilla_Lt 3D 50.4/50.4 1.8 28/28 6X, 10X   Narrative: The patient tolerated radiation therapy relatively well. She reports continued hyperpigmentation and peeling (using lotion), and swelling and tenderness to the left breast.   On physical exam: The left breast area shows less erythema compared to earlier this week.  Mild healing of moist desquamation noted in the inframammary fold area.  Overall, she tolerated the treatments well with expected skin changes.  Plan: The patient will follow-up with radiation oncology in one month .  ________________________________________________ -----------------------------------  Blair Promise, PhD, MD  This document serves as a record of services personally performed by Gery Pray, MD. It was created on his behalf by Roney Mans, a trained medical scribe. The creation of this record is based on the scribe's personal observations and the provider's statements to them. This document has been checked and approved by the attending provider.

## 2021-07-06 NOTE — Progress Notes (Incomplete)
Radiation Oncology         [phone] ________________________________  Name: Tammie [name] MRN: [id number]  Date: 07/07/2021  DOB: 08/17/1975  Follow-Up Visit Note  CC: [name], FNP  [name], FNP  No diagnosis found.  Diagnosis:  Status post left lumpectomy and chemotherapy: Stage IB (cT2, cN1, cM0) Left Breast LOQ, No residual carcinoma, ER+ / PR+ / Her2+ (previously grade 3)  Interval Since Last Radiation: 1 month and 7 days   Intent: Curative  Radiation Treatment Dates: 04/15/2021 through 05/30/2021 Site Technique Total Dose (Gy) Dose per Fx (Gy) Completed Fx Beam Energies  Breast, Left: Breast_Lt 3D 50.4/50.4 1.8 28/28 6X  Axilla, Left: Axilla_Lt 3D 50.4/50.4 1.8 28/28 6X, 10X    Narrative:  The patient returns today for routine follow-up. The patient tolerated radiation therapy relatively well other than continued hyperpigmentation and peeling (for which she used lotion), and swelling and tenderness to the left breast. Physical exam performed on the date of her final weekly treatment check revealed the left breast area to show less erythema compared to earlier that week.  Mild healing of moist desquamation was also noted in the inframammary fold area.     The patient completed adjuvant chemotherapy consisting of Trastuzumab and Pertuzumab on 05/09/21.   On the date of her final treatment, the patient followed up with Dr. Lindi Adie to start Herceptin and Perjeta maintenance. During this visit, the patient was was noted to tolerate her last cycle of chemotherapy quite well, and denied any side effects. She will continue on Herceptin and Perjeta maintenance every 3 weeks until June of this year.  In most recent history, the patient met with Dr. Georgette Dover on 06/27/20. During this visit, the patient reported some tightness in her left axilla when she raises her left arm. Physical exam performed revealed a palpable mass measuring about 2.5 cm just above the left axillary  incision site. Per Dr. Georgette Dover, the mass likely represents a seroma or lymphocele. Accordingly, Dr. Georgette Dover ordered a left axillary ultrasound. If US shows a seroma or lymphocele, she will likely return for aspiration of the site.   Of note: Her last echocardiogram on 04/25/21 showed an EF of 53%.                          Allergies:  has No Known Allergies.  Meds: Current Outpatient Medications  Medication Sig Dispense Refill   ascorbic acid (VITAMIN C) 1000 MG tablet Take by mouth.     Cholecalciferol (DIALYVITE VITAMIN D 5000) 125 MCG (5000 UT) capsule Take 5,000 Units by mouth daily.     citalopram (CELEXA) 20 MG tablet Take 20 mg by mouth daily.     ferrous sulfate 325 (65 FE) MG tablet Take by mouth.     ibuprofen (ADVIL) 800 MG tablet Take 800 mg by mouth 2 (two) times daily.     lidocaine-prilocaine (EMLA) cream Apply 1 application topically as needed. 30 g 0   Magnesium 500 MG CAPS Take 1 capsule (500 mg total) by mouth daily.  0   metoprolol tartrate (LOPRESSOR) 25 MG tablet Take 25 mg by mouth daily.     NON FORMULARY Take 900 mg by mouth daily. Hyaluronic Acid Complex     OVER THE COUNTER MEDICATION Take 4 capsules by mouth daily. Laminine supplement- 620 mg     No current facility-administered medications for this encounter.    Physical Findings: The patient is in no  acute distress. Patient is alert and oriented.  vitals were not taken for this visit. .  No significant changes. Lungs are clear to auscultation bilaterally. Heart has regular rate and rhythm. No palpable cervical, supraclavicular, or axillary adenopathy. Abdomen soft, non-tender, normal bowel sounds.  Breast: no palpable mass, nipple discharge or bleeding. ***    Lab Findings: Lab Results  Component Value Date   WBC 6.1 05/30/2021   HGB 10.9 (L) 05/30/2021   HCT 32.2 (L) 05/30/2021   MCV 86.6 05/30/2021   PLT 198 05/30/2021    Radiographic Findings: No results found.  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  ***  Plan:  ***   *** minutes of total time was spent for this patient encounter, including preparation, face-to-face counseling with the patient and coordination of care, physical exam, and documentation of the encounter. ____________________________________  Blair Promise, PhD, MD  This document serves as a record of services personally performed by Gery Pray, MD. It was created on his behalf by Roney Mans, a trained medical scribe. The creation of this record is based on the scribe's personal observations and the provider's statements to them. This document has been checked and approved by the attending provider.

## 2021-07-07 ENCOUNTER — Ambulatory Visit
Admission: RE | Admit: 2021-07-07 | Discharge: 2021-07-07 | Disposition: A | Discharge status: Home / Self Care | Payer: Managed Care, Other (non HMO) | Source: Ambulatory Visit | Attending: Radiation Oncology | Admitting: Radiation Oncology

## 2021-07-07 DIAGNOSIS — Z170 Estrogen receptor positive status [ER+]: Secondary | ICD-10-CM | POA: Insufficient documentation

## 2021-07-07 DIAGNOSIS — Z95828 Presence of other vascular implants and grafts: Secondary | ICD-10-CM | POA: Insufficient documentation

## 2021-07-07 DIAGNOSIS — C50512 Malignant neoplasm of lower-outer quadrant of left female breast: Secondary | ICD-10-CM | POA: Insufficient documentation

## 2021-07-07 HISTORY — DX: Personal history of irradiation: Z92.3

## 2021-07-11 ENCOUNTER — Ambulatory Visit: Payer: 59

## 2021-07-11 ENCOUNTER — Ambulatory Visit: Payer: 59 | Admitting: Hematology and Oncology

## 2021-07-11 ENCOUNTER — Other Ambulatory Visit: Payer: 59

## 2021-07-14 ENCOUNTER — Other Ambulatory Visit: Payer: Self-pay | Admitting: Hematology and Oncology

## 2021-07-17 NOTE — Progress Notes (Signed)
Patient Care Team: Tammie Hickman, FNP as PCP - General (Family Medicine) Rockwell Germany, RN as Oncology Nurse Navigator Mauro Kaufmann, RN as Oncology Nurse Navigator Donnie Mesa, MD as Consulting Physician (General Surgery) Nicholas Lose, MD as Consulting Physician (Hematology and Oncology) Gery Pray, MD as Consulting Physician (Radiation Oncology)  DIAGNOSIS:    ICD-10-CM   1. Malignant neoplasm of lower-outer quadrant of left breast of female, estrogen receptor positive ([place])  C50.512    Z17.0       SUMMARY OF ONCOLOGIC HISTORY: Oncology History  Malignant neoplasm of lower-outer quadrant of left breast of female, estrogen receptor positive ([place])  10/01/2020 Initial Diagnosis   Screening mammogram showed a left breast mass and calcifications. Diagnostic mammogram and US showed a 1.7cm and 0.5cm mass at the 5 o'clock position in the left breast, with one 0.4cm abnormal right axillary lymph node. 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   10/09/2020 Cancer Staging   Staging form: Breast, AJCC 8th Edition - Clinical stage from 10/09/2020: Stage IB (cT2, cN1, cM0, G3, ER+, PR+, HER2+) - Signed by Nicholas Lose, MD on 10/09/2020 Stage prefix: Initial diagnosis Histologic grading system: 3 grade system Percentage of positive estrogen receptors (%): 5 Percentage of positive progesterone receptors (%): 1 Ki-67 (%): 20    10/18/2020 - 02/27/2021 Chemotherapy   Patient is on Treatment Plan : BREAST  Docetaxel + Carboplatin + Trastuzumab + Pertuzumab  (TCHP) q21d      10/23/2020 Genetic Testing   Negative genetic testing:  No pathogenic variants detected on the [employer] CancerNext-Expanded + RNAinsight panel. The report date is 10/23/2020.   The CancerNext-Expanded + RNAinsight gene panel offered by [employer] and includes sequencing and rearrangement analysis for the following 77 genes: AIP, ALK, APC, ATM,  AXIN2, BAP1, BARD1, BLM, BMPR1A, BRCA1, BRCA2, BRIP1, CDC73, CDH1, CDK4, CDKN1B, CDKN2A, CHEK2, CTNNA1, DICER1, FANCC, FH, FLCN, GALNT12, KIF1B, LZTR1, MAX, MEN1, MET, MLH1, MSH2, MSH3, MSH6, MUTYH, NBN, NF1, NF2, NTHL1, PALB2, PHOX2B, PMS2, POT1, PRKAR1A, PTCH1, PTEN, RAD51C, RAD51D, RB1, RECQL, RET, SDHA, SDHAF2, SDHB, SDHC, SDHD, SMAD4, SMARCA4, SMARCB1, SMARCE1, STK11, SUFU, TMEM127, TP53, TSC1, TSC2, VHL and XRCC2 (sequencing and deletion/duplication); EGFR, EGLN1, HOXB13, KIT, MITF, PDGFRA, POLD1 and POLE (sequencing only); EPCAM and GREM1 (deletion/duplication only). RNA data is routinely analyzed for use in variant interpretation for all genes.   03/31/2021 -  Chemotherapy   Patient is on Treatment Plan : BREAST Trastuzumab  + Pertuzumab q21d x 13 cycles       CHIEF COMPLIANT: Herceptin and Pertuzumab maintenance  INTERVAL HISTORY: Tammie [name] is a 46 y.o. with above-mentioned history of left breast cancer who completed neoadjuvant chemotherapy. She presents to the clinic today for treatment.  She noticed a swelling underneath the left axilla and saw Dr. Georgette Dover who set her up for ultrasound but it has not been scheduled yet and she is very concerned about it.  It could be a seroma versus lymph node.  She is also having a lot of anxiety and is going to see a counselor in the next week or so to help with that. She has noticed a erythematous area on her belly which is very tender and looks like it is developing into an infection.  ALLERGIES:  has No Known Allergies.  MEDICATIONS:  Current Outpatient Medications  Medication Sig Dispense Refill   Biotin 1 MG CAPS Take 1 capsule by mouth daily.  30 capsule    cephALEXin (KEFLEX) 500 MG capsule Take 1 capsule (500 mg total) by mouth 2 (two) times daily. 14 capsule 0   ascorbic acid (VITAMIN C) 1000 MG tablet Take by mouth.     Cholecalciferol (DIALYVITE VITAMIN D 5000) 125 MCG (5000 UT) capsule Take 5,000 Units by mouth daily.      citalopram (CELEXA) 20 MG tablet Take 20 mg by mouth daily.     ferrous sulfate 325 (65 FE) MG tablet Take by mouth.     ibuprofen (ADVIL) 800 MG tablet Take 800 mg by mouth 2 (two) times daily.     lidocaine-prilocaine (EMLA) cream Apply 1 application topically as needed. 30 g 0   Magnesium 500 MG CAPS Take 1 capsule (500 mg total) by mouth daily.  0   metoprolol tartrate (LOPRESSOR) 25 MG tablet Take 25 mg by mouth daily.     NON FORMULARY Take 900 mg by mouth daily. Hyaluronic Acid Complex     OVER THE COUNTER MEDICATION Take 4 capsules by mouth daily. Laminine supplement- 620 mg     No current facility-administered medications for this visit.    PHYSICAL EXAMINATION: ECOG PERFORMANCE STATUS: 1 - Symptomatic but completely ambulatory  Vitals:   07/18/21 0942  BP: 120/71  Pulse: 73  Resp: 16  Temp: 97.7 F (36.5 C)  SpO2: 98%   Filed Weights   07/18/21 0942  Weight: 151 lb 9.6 oz (68.8 kg)    BREAST: Left axilla seroma versus lymph node.    LABORATORY DATA:  I have reviewed the data as listed CMP Latest Ref Rng & Units 05/30/2021 05/09/2021 03/31/2021  Glucose 70 - 99 mg/dL 95 96 110(H)  BUN 6 - 20 mg/dL _0 Creatinine 0.44 - 1.00 mg/dL 0.71 0.72 0.71  Sodium 135 - 145 mmol/L 139 139 142  Potassium 3.5 - 5.1 mmol/L 4.0 3.8 3.4(L)  Chloride 98 - 111 mmol/L 103 104 105  CO2 22 - 32 mmol/L _1 Calcium 8.9 - 10.3 mg/dL 9.5 9.5 9.9  Total Protein 6.5 - 8.1 g/dL 7.5 7.1 6.9  Total Bilirubin 0.3 - 1.2 mg/dL 0.2(L) 0.2(L) 0.3  Alkaline Phos 38 - 126 U/L 56 50 49  AST 15 - 41 U/L 14(L) 16 15  ALT 0 - 44 U/L _2 Lab Results  Component Value Date   WBC 4.5 07/18/2021   HGB 10.5 (L) 07/18/2021   HCT 31.3 (L) 07/18/2021   MCV 86.7 07/18/2021   PLT 194 07/18/2021   NEUTROABS 2.8 07/18/2021    ASSESSMENT & PLAN:  Malignant neoplasm of lower-outer quadrant of left breast of female, estrogen receptor positive ([place]) 10/01/2020:Screening mammogram showed  a left breast mass and calcifications. Diagnostic mammogram and US showed a 1.7cm and 0.5cm mass at the 5 o'clock position in the left breast, with one 0.4cm abnormal right axillary lymph node. 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   Treatment Plan: 1. Neoadjuvant chemotherapy with TCH Perjeta 6 cycles followed by Herceptin Perjeta maintenance for 1 year 2. 03/12/2021: Pathologic complete response, 0/6 lymph nodes negative 3. Followed by adjuvant radiation therapy completed 05/30/2021 4.  Followed by antiestrogen therapy although ER is weak URCC nausea study Breast MRI: 10/17/20: 2.4 cm left breast mass, intramammary lymph node ------------------------------------------------------------------------------------------------------------------------- Current treatment: Completed 6 cycles of TCHP, currently on Herceptin and Perjeta maintenance (will complete June 2023)  Radiation completed 05/30/2021    Herceptin Perjeta toxicities: Echocardiogram 04/25/2021: EF 53% Mild anemia: Today's hemoglobin is 10.5  Cutaneous infection on the abdominal wall: Sent a prescription for Keflex antibiotic Left axillary nodule could be a lymph node versus seroma: I requested Keisha or navigator to arrange for the ultrasound. Anxiety: Patient is going to see her therapist in the next week.  RTC every 3 weeks for Herceptin and Perjeta maintenance.   Labs and MD visits every 6 weeks    No orders of the defined types were placed in this encounter.  The patient has a good understanding of the overall plan. she agrees with it. she will call with any problems that may develop before the next visit here.  Total time spent: 30 mins including face to face time and time spent for planning, charting and coordination of care  Rulon Eisenmenger, MD, MPH 07/18/2021  I, Thana Ates, am acting as scribe for Dr. Nicholas Lose.  I have reviewed the  above documentation for accuracy and completeness, and I agree with the above.

## 2021-07-17 NOTE — Assessment & Plan Note (Signed)
10/01/2020:Screening mammogram showed a left breast mass and calcifications. Diagnostic mammogram and US showed a 1.7cm and 0.5cm mass at the 5 o'clock position in the left breast, with one 0.4cm abnormal right axillary lymph node. 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  Treatment Plan: 1. Neoadjuvant chemotherapy with TCH Perjeta 6 cycles followed by Herceptin Perjeta maintenance for 1 year 2.03/12/2021: Pathologic complete response, 0/6 lymph nodes negative 3. Followed by adjuvant radiation therapycompleted 05/30/2021 4.Followed by antiestrogen therapy although ER is weak URCC nausea study Breast MRI: 10/17/20:2.4 cm left breast mass, intramammary lymph node ------------------------------------------------------------------------------------------------------------------------- Current treatment:Completed 6 cycles ofTCHP, currently on Herceptin and Perjeta maintenance (will complete June 2023)  Radiation completed 05/30/2021   Herceptin Perjeta toxicities: Echocardiogram 04/25/2021: EF 53% Mild anemia: Today's hemoglobin is 10.9. RTCevery3 weeks forHerceptin and Perjeta maintenance.Labs and MD visits every 6 weeks

## 2021-07-18 ENCOUNTER — Other Ambulatory Visit: Payer: Self-pay

## 2021-07-18 ENCOUNTER — Other Ambulatory Visit: Payer: Self-pay | Admitting: Hematology and Oncology

## 2021-07-18 ENCOUNTER — Inpatient Hospital Stay: Payer: Commercial Managed Care - HMO | Admitting: Hematology and Oncology

## 2021-07-18 ENCOUNTER — Inpatient Hospital Stay: Payer: Commercial Managed Care - HMO | Attending: Hematology and Oncology

## 2021-07-18 ENCOUNTER — Inpatient Hospital Stay: Payer: Commercial Managed Care - HMO

## 2021-07-18 VITALS — BP 118/66 | HR 80 | Temp 97.9°F | Resp 16

## 2021-07-18 DIAGNOSIS — Z9221 Personal history of antineoplastic chemotherapy: Secondary | ICD-10-CM | POA: Insufficient documentation

## 2021-07-18 DIAGNOSIS — C50512 Malignant neoplasm of lower-outer quadrant of left female breast: Secondary | ICD-10-CM

## 2021-07-18 DIAGNOSIS — Z170 Estrogen receptor positive status [ER+]: Secondary | ICD-10-CM

## 2021-07-18 DIAGNOSIS — R590 Localized enlarged lymph nodes: Secondary | ICD-10-CM | POA: Diagnosis not present

## 2021-07-18 DIAGNOSIS — Z923 Personal history of irradiation: Secondary | ICD-10-CM | POA: Insufficient documentation

## 2021-07-18 DIAGNOSIS — Z5111 Encounter for antineoplastic chemotherapy: Secondary | ICD-10-CM | POA: Diagnosis present

## 2021-07-18 DIAGNOSIS — F419 Anxiety disorder, unspecified: Secondary | ICD-10-CM | POA: Diagnosis not present

## 2021-07-18 DIAGNOSIS — Z79899 Other long term (current) drug therapy: Secondary | ICD-10-CM | POA: Diagnosis not present

## 2021-07-18 DIAGNOSIS — Z95828 Presence of other vascular implants and grafts: Secondary | ICD-10-CM

## 2021-07-18 DIAGNOSIS — M7989 Other specified soft tissue disorders: Secondary | ICD-10-CM

## 2021-07-18 LAB — CMP (CANCER CENTER ONLY)
ALT: 13 U/L (ref 0–44)
AST: 17 U/L (ref 15–41)
Albumin: 4 g/dL (ref 3.5–5.0)
Alkaline Phosphatase: 54 U/L (ref 38–126)
Anion gap: 7 (ref 5–15)
BUN: 18 mg/dL (ref 6–20)
CO2: 28 mmol/L (ref 22–32)
Calcium: 9.5 mg/dL (ref 8.9–10.3)
Chloride: 103 mmol/L (ref 98–111)
Creatinine: 0.68 mg/dL (ref 0.44–1.00)
GFR, Estimated: >60 mL/min (ref >60)
Glucose, Bld: 95 mg/dL (ref 70–99)
Potassium: 3.9 mmol/L (ref 3.5–5.1)
Sodium: 138 mmol/L (ref 135–145)
Total Bilirubin: 0.4 mg/dL (ref 0.3–1.2)
Total Protein: 7 g/dL (ref 6.5–8.1)

## 2021-07-18 LAB — CBC WITH DIFFERENTIAL (CANCER CENTER ONLY)
Abs Immature Granulocytes: 0.01 10*3/uL (ref 0.00–0.07)
Basophils Absolute: 0 10*3/uL (ref 0.0–0.1)
Basophils Relative: 0 %
Eosinophils Absolute: 0.2 10*3/uL (ref 0.0–0.5)
Eosinophils Relative: 4 %
HCT: 31.3 % — ABNORMAL LOW (ref 36.0–46.0)
Hemoglobin: 10.5 g/dL — ABNORMAL LOW (ref 12.0–15.0)
Immature Granulocytes: 0 %
Lymphocytes Relative: 22 %
Lymphs Abs: 1 10*3/uL (ref 0.7–4.0)
MCH: 29.1 pg (ref 26.0–34.0)
MCHC: 33.5 g/dL (ref 30.0–36.0)
MCV: 86.7 fL (ref 80.0–100.0)
Monocytes Absolute: 0.5 10*3/uL (ref 0.1–1.0)
Monocytes Relative: 11 %
Neutro Abs: 2.8 10*3/uL (ref 1.7–7.7)
Neutrophils Relative %: 63 %
Platelet Count: 194 10*3/uL (ref 150–400)
RBC: 3.61 MIL/uL — ABNORMAL LOW (ref 3.87–5.11)
RDW: 15.4 % (ref 11.5–15.5)
WBC Count: 4.5 10*3/uL (ref 4.0–10.5)
nRBC: 0 % (ref 0.0–0.2)

## 2021-07-18 MED ORDER — CEPHALEXIN 500 MG PO CAPS: 500.0000 mg | ORAL_CAPSULE | Freq: Two times a day (BID) | ORAL | 0 refills | Status: DC

## 2021-07-18 MED ORDER — SODIUM CHLORIDE 0.9% FLUSH
10.0000 mL | INTRAVENOUS | Status: DC | PRN
  Administered 2021-07-18: 10 mL

## 2021-07-18 MED ORDER — BIOTIN 1 MG PO CAPS: 1.0000 | ORAL_CAPSULE | Freq: Every day | ORAL | Status: DC

## 2021-07-18 MED ORDER — SODIUM CHLORIDE 0.9% FLUSH
10.0000 mL | Freq: Once | INTRAVENOUS | Status: AC
  Administered 2021-07-18: 10 mL

## 2021-07-18 MED ORDER — SODIUM CHLORIDE 0.9 % IV SOLN
420.0000 mg | Freq: Once | INTRAVENOUS | Status: AC
  Administered 2021-07-18: 420 mg via INTRAVENOUS
  Filled 2021-07-18: qty 14

## 2021-07-18 MED ORDER — HEPARIN SOD (PORK) LOCK FLUSH 100 UNIT/ML IV SOLN
500.0000 [IU] | Freq: Once | INTRAVENOUS | Status: AC | PRN
  Administered 2021-07-18: 500 [IU]

## 2021-07-18 MED ORDER — DIPHENHYDRAMINE HCL 25 MG PO CAPS
25.0000 mg | ORAL_CAPSULE | Freq: Once | ORAL | Status: AC
  Administered 2021-07-18: 25 mg via ORAL
  Filled 2021-07-18: qty 1

## 2021-07-18 MED ORDER — SODIUM CHLORIDE 0.9 % IV SOLN: Freq: Once | INTRAVENOUS | Status: AC

## 2021-07-18 MED ORDER — TRASTUZUMAB-ANNS CHEMO 150 MG IV SOLR
6.0000 mg/kg | Freq: Once | INTRAVENOUS | Status: AC
  Administered 2021-07-18: 399 mg via INTRAVENOUS
  Filled 2021-07-18: qty 19

## 2021-07-18 MED ORDER — ACETAMINOPHEN 325 MG PO TABS
650.0000 mg | ORAL_TABLET | Freq: Once | ORAL | Status: AC
  Administered 2021-07-18: 650 mg via ORAL
  Filled 2021-07-18: qty 2

## 2021-07-18 NOTE — Patient Instructions (Signed)
[facility]  Discharge Instructions: °Thank you for choosing [facility] to provide your oncology and hematology care.  ° °If you have a lab appointment with the Cancer Center, please go directly to the Cancer Center and check in at the registration area. °  °Wear comfortable clothing and clothing appropriate for easy access to any Portacath or PICC line.  ° °We strive to give you quality time with your provider. You may need to reschedule your appointment if you arrive late (15 or more minutes).  Arriving late affects you and other patients whose appointments are after yours.  Also, if you miss three or more appointments without notifying the office, you may be dismissed from the clinic at the provider’s discretion.    °  °For prescription refill requests, have your pharmacy contact our office and allow 72 hours for refills to be completed.   ° °Today you received the following chemotherapy and/or immunotherapy agents: Kanjinti & Perjeta   °  °To help prevent nausea and vomiting after your treatment, we encourage you to take your nausea medication as directed. ° °BELOW ARE SYMPTOMS THAT SHOULD BE REPORTED IMMEDIATELY: °*FEVER GREATER THAN 100.4 F (38 °C) OR HIGHER °*CHILLS OR SWEATING °*NAUSEA AND VOMITING THAT IS NOT CONTROLLED WITH YOUR NAUSEA MEDICATION °*UNUSUAL SHORTNESS OF BREATH °*UNUSUAL BRUISING OR BLEEDING °*URINARY PROBLEMS (pain or burning when urinating, or frequent urination) °*BOWEL PROBLEMS (unusual diarrhea, constipation, pain near the anus) °TENDERNESS IN MOUTH AND THROAT WITH OR WITHOUT PRESENCE OF ULCERS (sore throat, sores in mouth, or a toothache) °UNUSUAL RASH, SWELLING OR PAIN  °UNUSUAL VAGINAL DISCHARGE OR ITCHING  ° °Items with * indicate a potential emergency and should be followed up as soon as possible or go to the Emergency Department if any problems should occur. ° °Please show the CHEMOTHERAPY ALERT CARD or IMMUNOTHERAPY ALERT CARD at  check-in to the Emergency Department and triage nurse. ° °Should you have questions after your visit or need to cancel or reschedule your appointment, please contact [facility]  Dept: [phone]  and follow the prompts.  Office hours are 8:00 a.m. to 4:30 p.m. Monday - Friday. Please note that voicemails left after 4:00 p.m. may not be returned until the following business day.  We are closed weekends and major holidays. You have access to a nurse at all times for urgent questions. Please call the main number to the clinic Dept: [phone] and follow the prompts. ° ° °For any non-urgent questions, you may also contact your provider using MyChart. We now offer e-Visits for anyone 18 and older to request care online for non-urgent symptoms. For details visit [email or web address]. °  °Also download the MyChart app! Go to the app store, search "MyChart", open the app, select [facility], and log in with your MyChart username and password. ° °Due to Covid, a mask is required upon entering the hospital/clinic. If you do not have a mask, one will be given to you upon arrival. For doctor visits, patients may have 1 support person aged 18 or older with them. For treatment visits, patients cannot have anyone with them due to current Covid guidelines and our immunocompromised population.  ° °

## 2021-07-18 NOTE — Progress Notes (Signed)
Ok to proceed with treatment a few days early.  Raul Del Pierson, [place], BCPS, BCOP 07/18/2021 11:22 AM

## 2021-07-18 NOTE — Progress Notes (Signed)
Pt declined to stay for 30 minutes post Perjeta observation.  Pt tolerated trtmt well w/out incident.  VSS at discharge.  Ambulatory to lobby.

## 2021-07-21 ENCOUNTER — Other Ambulatory Visit: Payer: Self-pay

## 2021-07-21 ENCOUNTER — Other Ambulatory Visit: Payer: Self-pay | Admitting: Hematology and Oncology

## 2021-07-21 ENCOUNTER — Ambulatory Visit
Admission: RE | Admit: 2021-07-21 | Discharge: 2021-07-21 | Disposition: A | Discharge status: Home / Self Care | Payer: Managed Care, Other (non HMO) | Source: Ambulatory Visit | Attending: Hematology and Oncology | Admitting: Hematology and Oncology

## 2021-07-21 ENCOUNTER — Other Ambulatory Visit: Payer: Self-pay | Admitting: *Deleted

## 2021-07-21 DIAGNOSIS — M7989 Other specified soft tissue disorders: Secondary | ICD-10-CM

## 2021-07-21 DIAGNOSIS — R2232 Localized swelling, mass and lump, left upper limb: Secondary | ICD-10-CM

## 2021-07-21 IMAGING — US US AXILLARY LEFT
1 series · 6 of 6 positions shown · non-contrast
Comparison: Previous exam(s).

CLINICAL DATA: 45-year-old female status post malignant left
lumpectomy and axillary node excision presents with a palpable left
axillary lump since surgery. She states that has decreased in size,
but is mildly tender.

EXAM:
DIGITAL DIAGNOSTIC UNILATERAL LEFT MAMMOGRAM WITH TOMOSYNTHESIS AND
CAD; US AXILLARY LEFT
TECHNIQUE: Left digital diagnostic mammography and breast tomosynthesis was
performed. The images were evaluated with computer-aided detection.;
Targeted ultrasound examination of the left axilla was performed.

[Series 1: us axillary left · 0.07mm/px · 6 acquisitions, 6 frames shown]
[im 1/6]
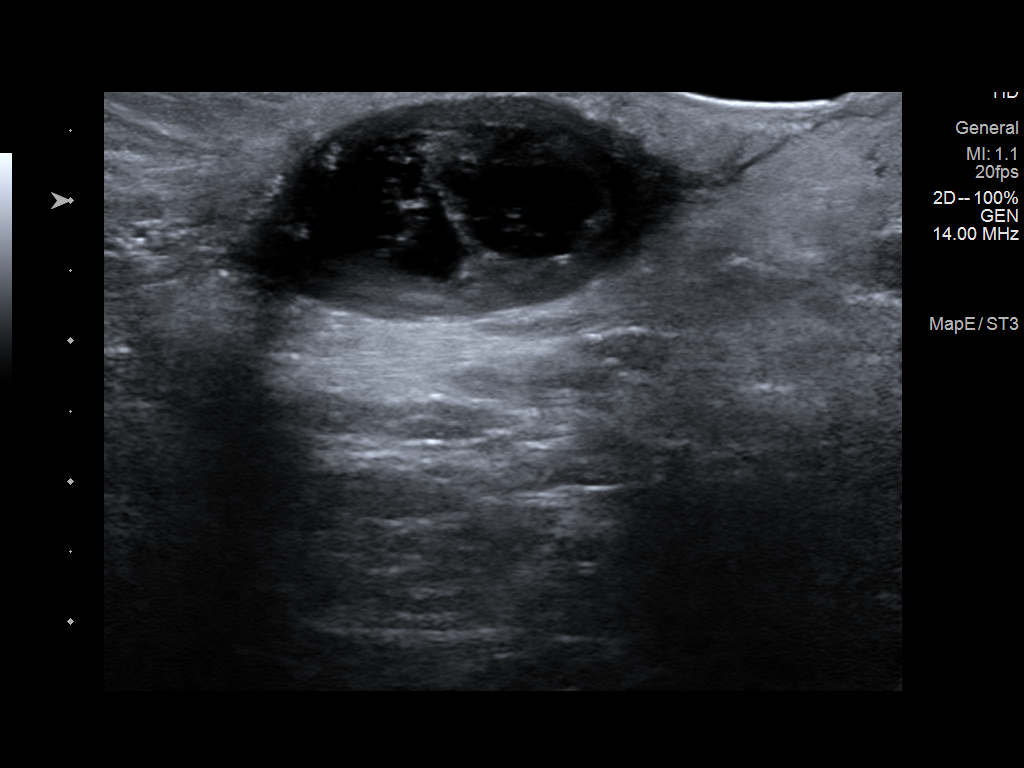
[im 2/6]
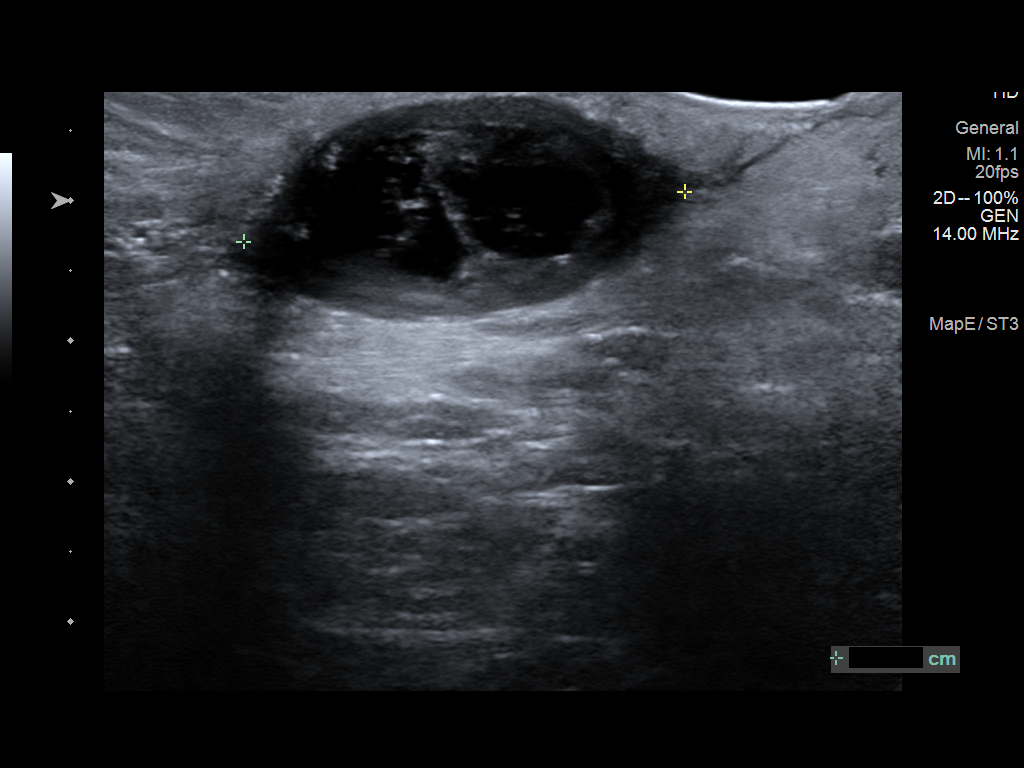
[im 3/6]
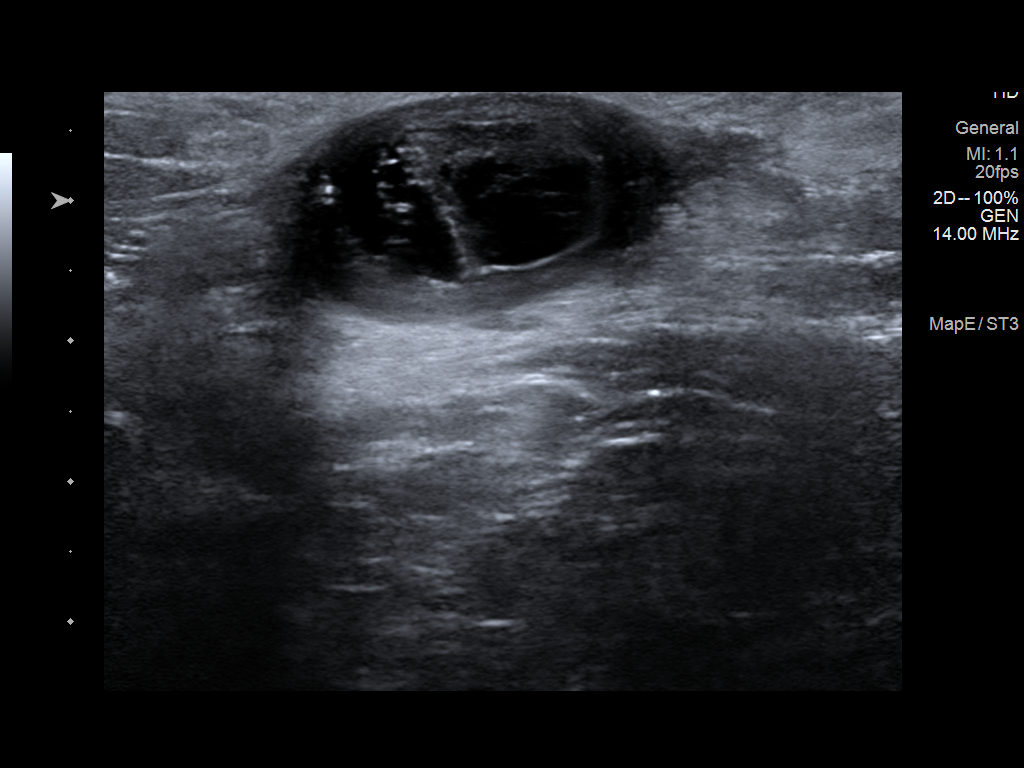
[im 4/6]
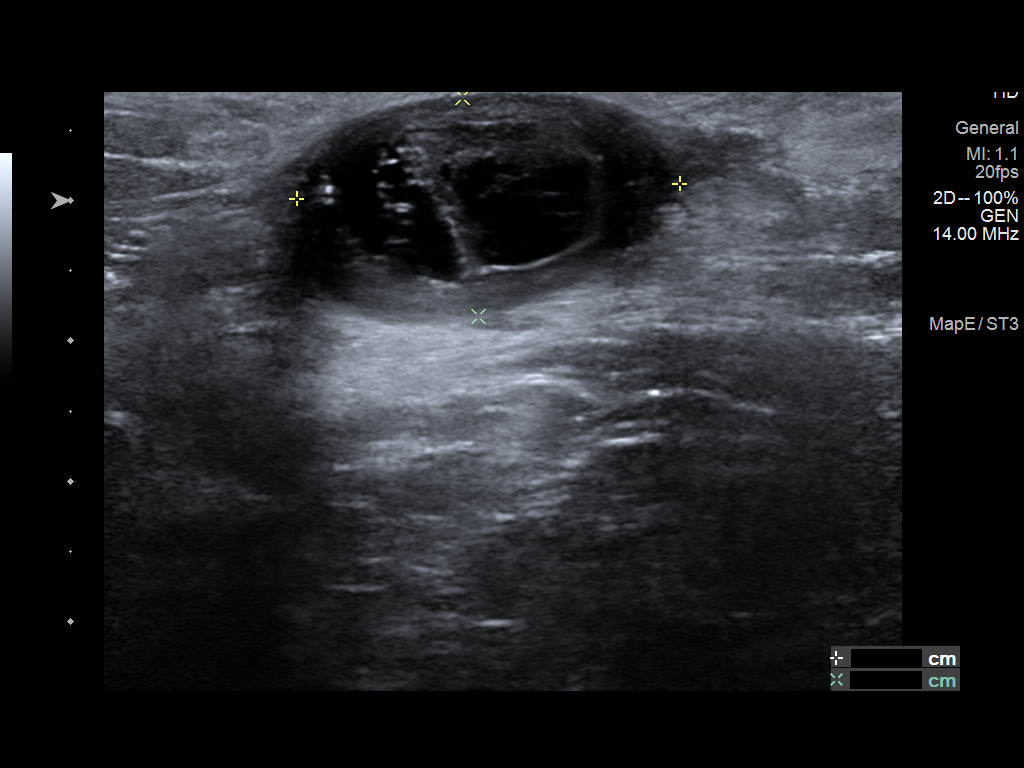
[im 5/6]
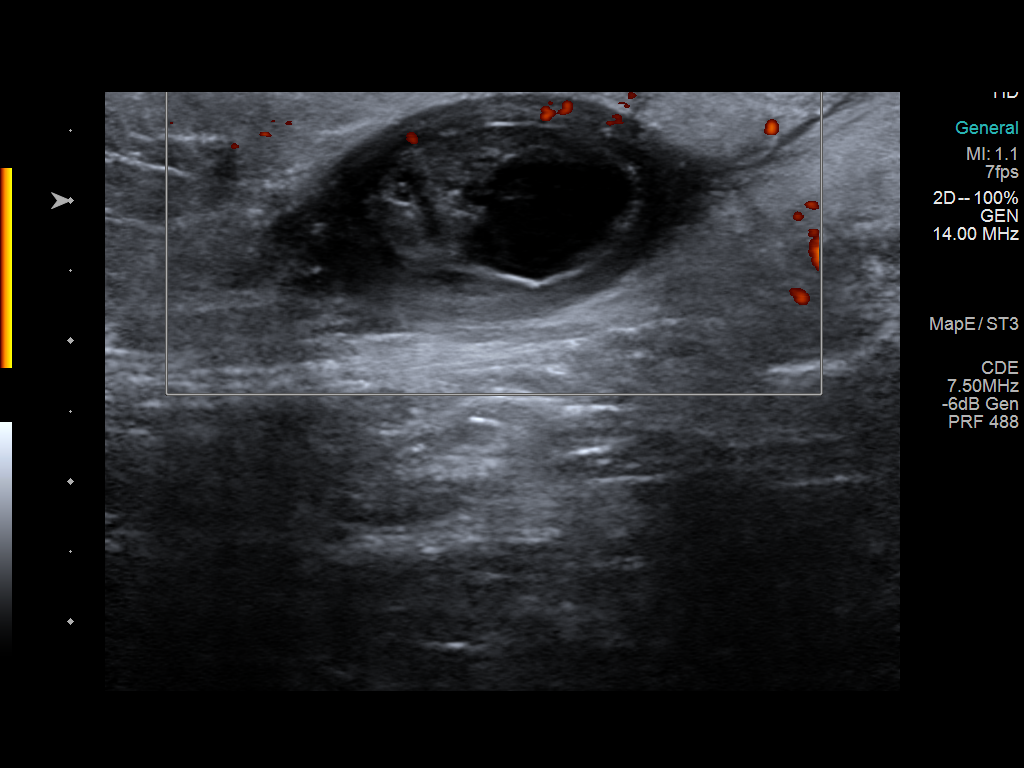
[im 6/6]
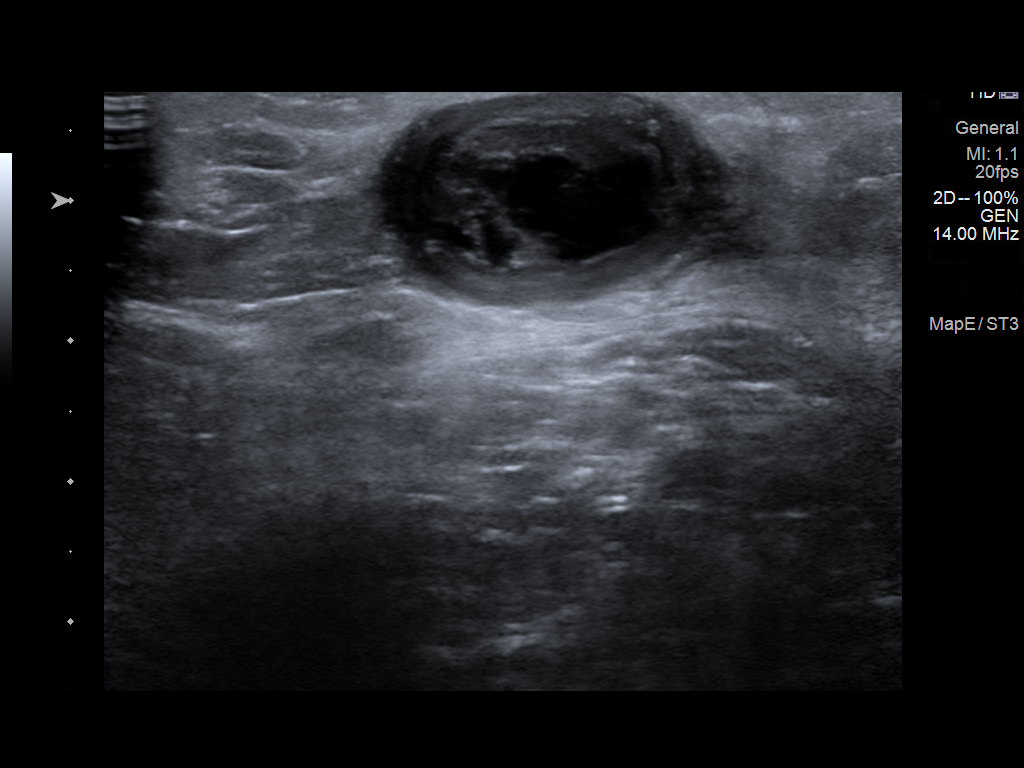

[6 of 6 positions shown; findings below may reference images not displayed]

ACR Breast Density Category b: There are scattered areas of
fibroglandular density.
FINDINGS: Radiopaque BB was placed at the site of the patient's palpable lump
in the left axilla. An oval, circumscribed hyperdense mass is seen
deep to the radiopaque BB. Otherwise, no suspicious findings in the
remainder of the left breast.

Targeted ultrasound is performed, showing a complex cystic oval,
circumscribed mass within the left axilla the site of the patient's
palpable lump. It measures 3.2 x 2.7 x 1.6 cm. There is mild
peripheral vascularity. This corresponds with the palpable lump and
the mammographic finding and is most consistent with a postoperative
seroma.
IMPRESSION: Probably benign, probable postoperative seroma within the left
axilla. Recommend short-term follow-up.

RECOMMENDATION:
Left axillary ultrasound in 6 months.

I have discussed the findings and recommendations with the patient.
If applicable, a reminder letter will be sent to the patient
regarding the next appointment.

BI-RADS CATEGORY  3: Probably benign.

## 2021-07-21 IMAGING — MG MM DIGITAL DIAGNOSTIC UNILAT*L* W/ TOMO W/ CAD
6 series · 6 of 18 positions shown · non-contrast
Comparison: Previous exam(s).

CLINICAL DATA: 45-year-old female status post malignant left
lumpectomy and axillary node excision presents with a palpable left
axillary lump since surgery. She states that has decreased in size,
but is mildly tender.

EXAM:
DIGITAL DIAGNOSTIC UNILATERAL LEFT MAMMOGRAM WITH TOMOSYNTHESIS AND
CAD; US AXILLARY LEFT
TECHNIQUE: Left digital diagnostic mammography and breast tomosynthesis was
performed. The images were evaluated with computer-aided detection.;
Targeted ultrasound examination of the left axilla was performed.

[L CC synth-2D]
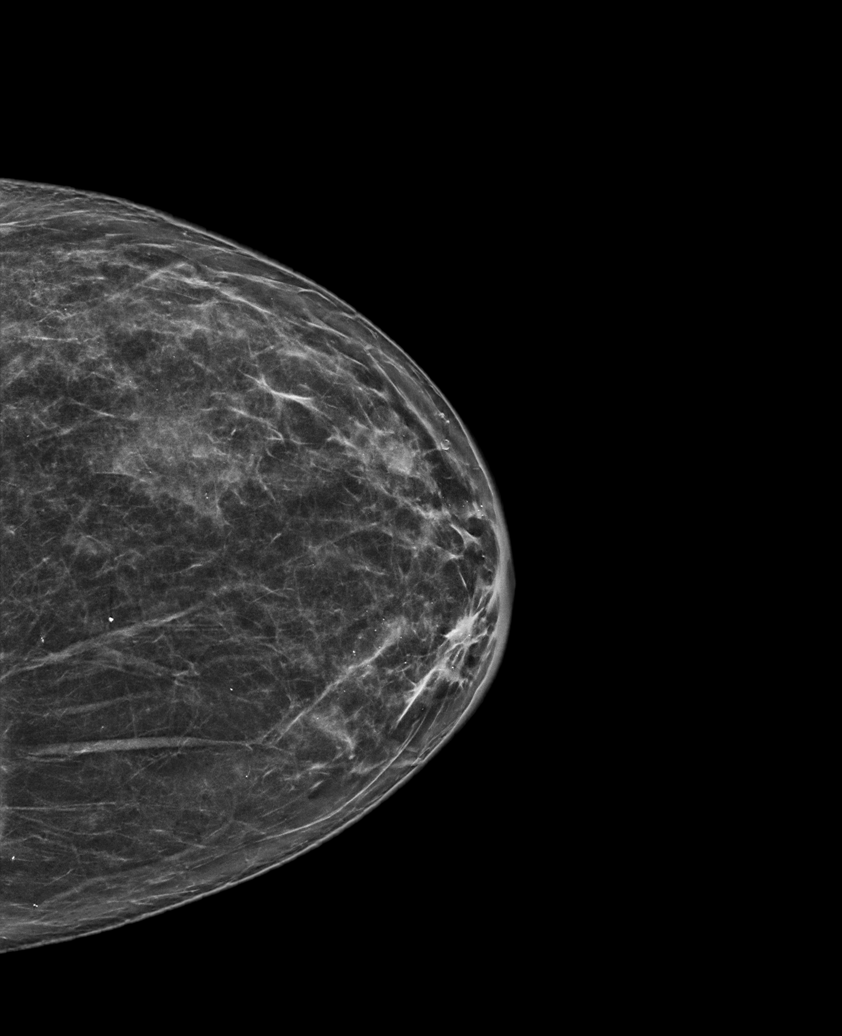

[L TAN synth-2D]
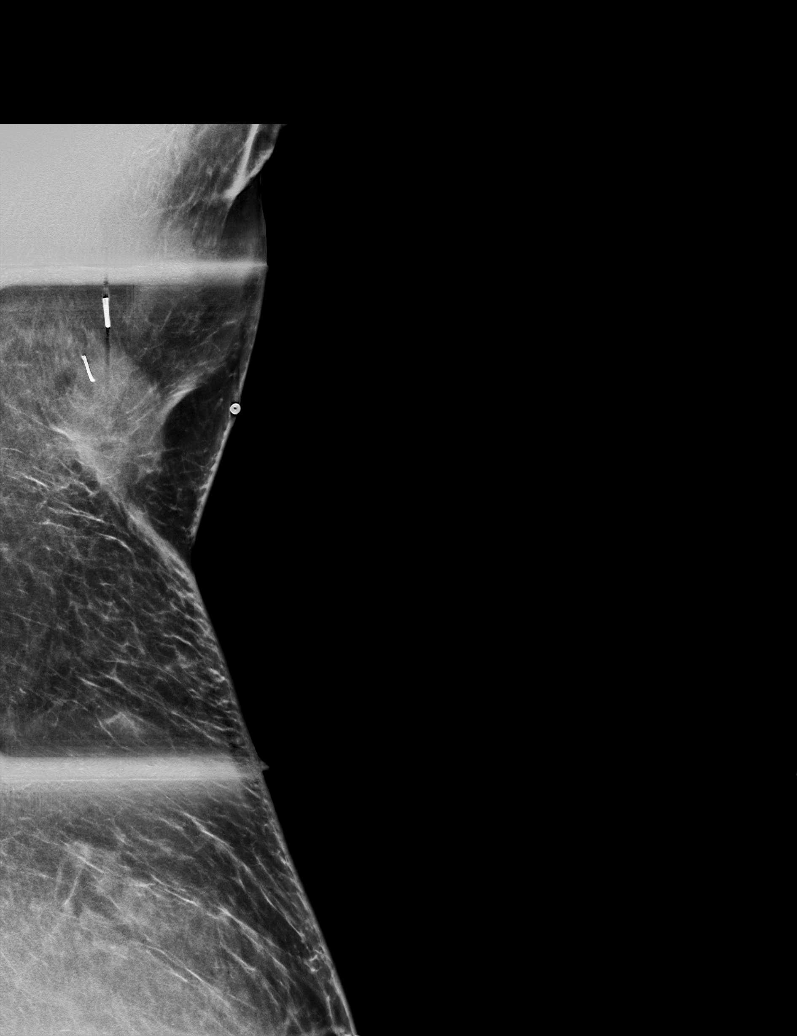

[L MLO synth-2D]
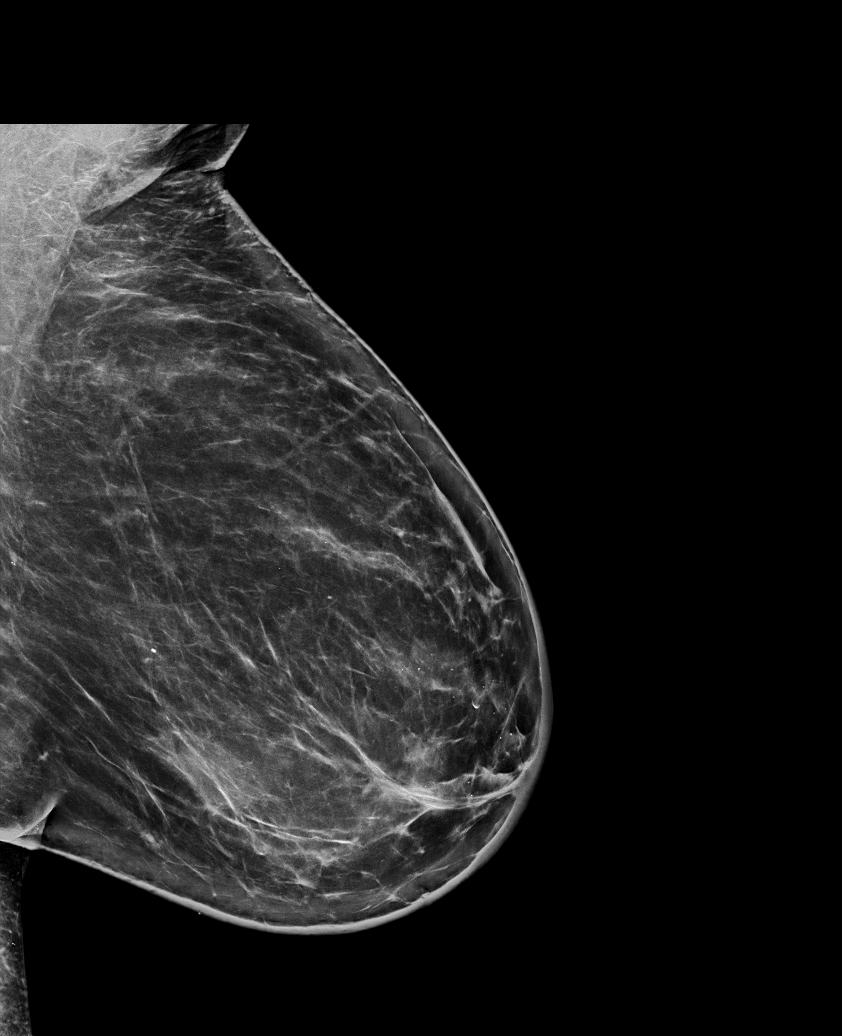

[L MLO tomo · tomo slice 47/94.0]
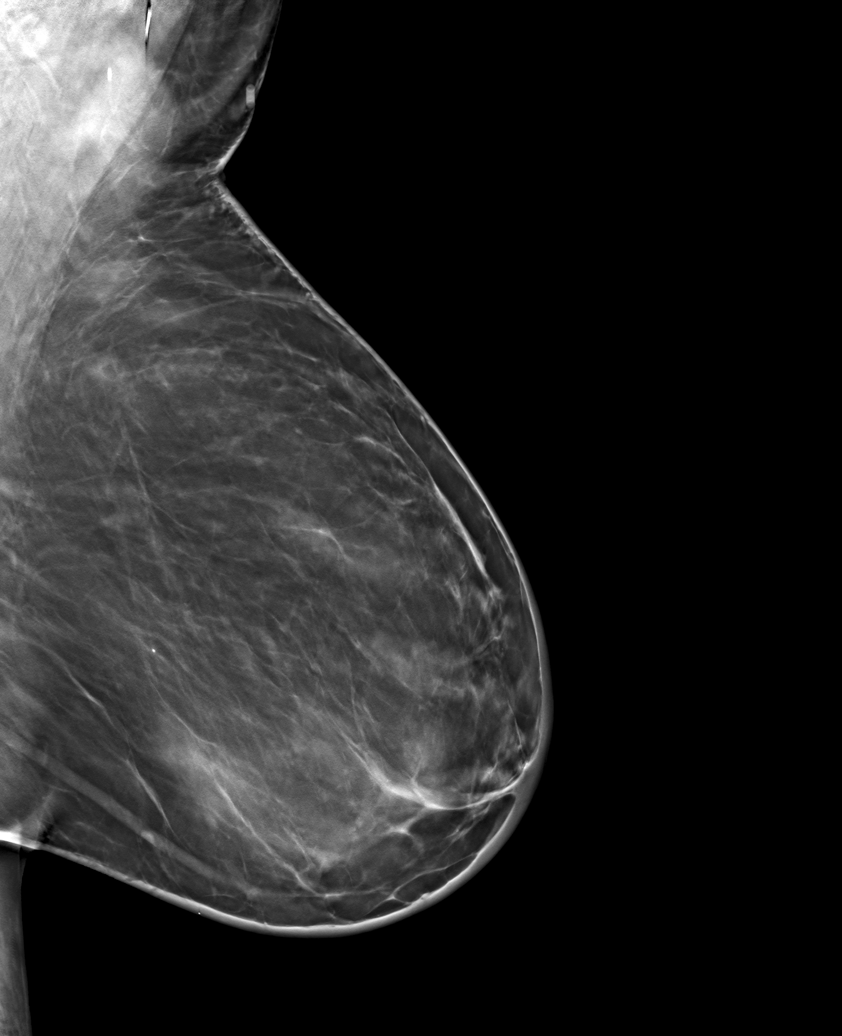

[L TAN tomo · tomo slice 34/67.0]
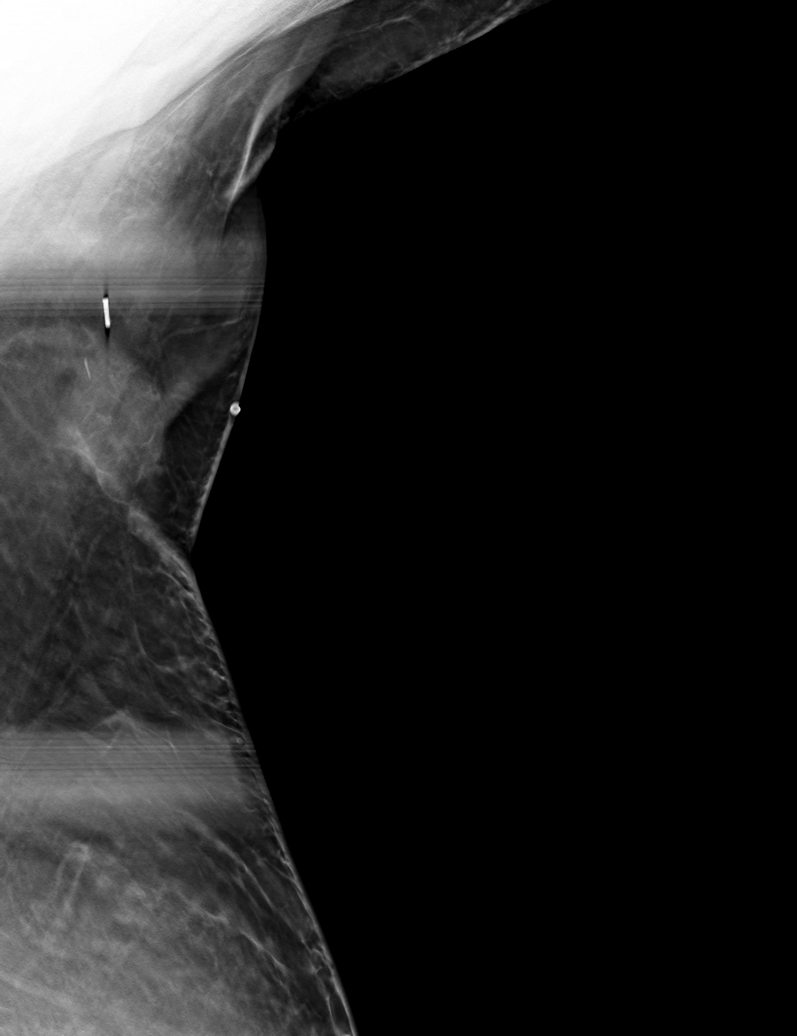

[L CC tomo · tomo slice 41/80.0]
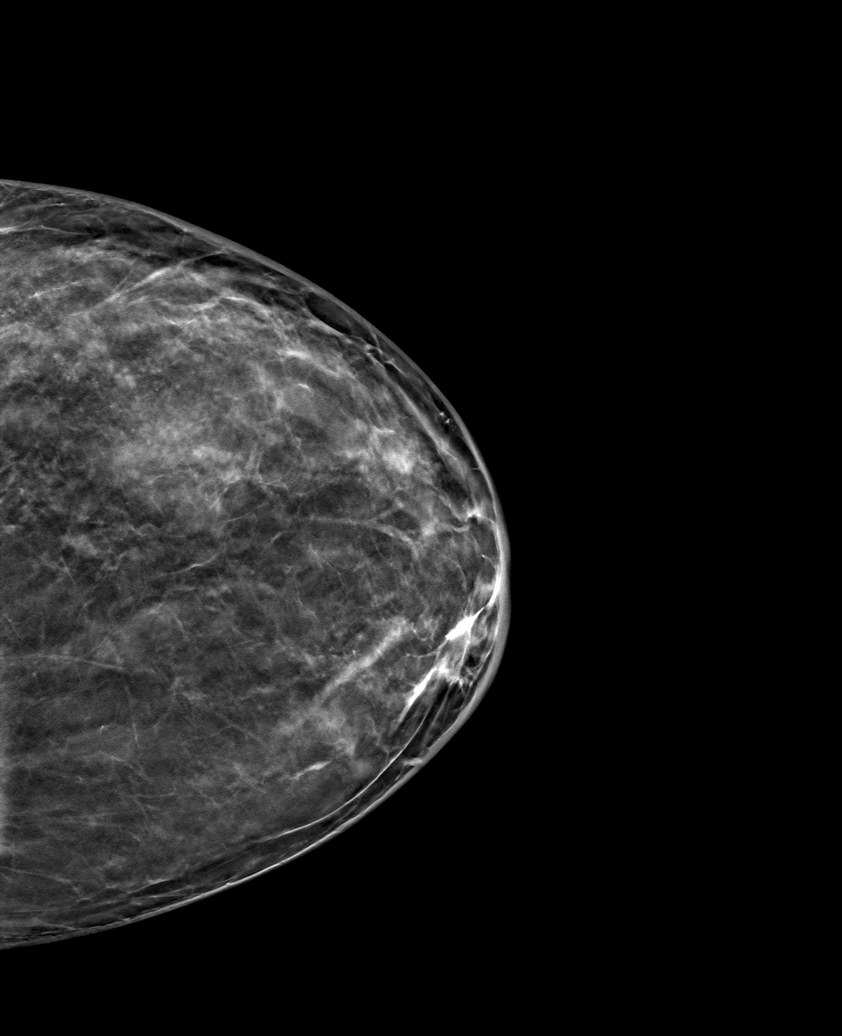

[6 of 18 positions shown; findings below may reference images not displayed]

ACR Breast Density Category b: There are scattered areas of
fibroglandular density.
FINDINGS: Radiopaque BB was placed at the site of the patient's palpable lump
in the left axilla. An oval, circumscribed hyperdense mass is seen
deep to the radiopaque BB. Otherwise, no suspicious findings in the
remainder of the left breast.

Targeted ultrasound is performed, showing a complex cystic oval,
circumscribed mass within the left axilla the site of the patient's
palpable lump. It measures 3.2 x 2.7 x 1.6 cm. There is mild
peripheral vascularity. This corresponds with the palpable lump and
the mammographic finding and is most consistent with a postoperative
seroma.
IMPRESSION: Probably benign, probable postoperative seroma within the left
axilla. Recommend short-term follow-up.

RECOMMENDATION:
Left axillary ultrasound in 6 months.

I have discussed the findings and recommendations with the patient.
If applicable, a reminder letter will be sent to the patient
regarding the next appointment.

BI-RADS CATEGORY  3: Probably benign.

## 2021-07-21 MED ORDER — CEPHALEXIN 500 MG PO CAPS: 500.0000 mg | ORAL_CAPSULE | Freq: Two times a day (BID) | ORAL | 0 refills | Status: DC

## 2021-08-01 ENCOUNTER — Ambulatory Visit: Payer: 59

## 2021-08-05 ENCOUNTER — Other Ambulatory Visit: Payer: Self-pay | Admitting: *Deleted

## 2021-08-05 ENCOUNTER — Inpatient Hospital Stay: Payer: Commercial Managed Care - HMO

## 2021-08-05 ENCOUNTER — Other Ambulatory Visit: Payer: Self-pay

## 2021-08-05 VITALS — BP 110/52 | HR 75 | Temp 97.5°F | Resp 18 | Ht 63.0 in | Wt 150.8 lb

## 2021-08-05 DIAGNOSIS — Z5181 Encounter for therapeutic drug level monitoring: Secondary | ICD-10-CM

## 2021-08-05 DIAGNOSIS — C50512 Malignant neoplasm of lower-outer quadrant of left female breast: Secondary | ICD-10-CM

## 2021-08-05 DIAGNOSIS — Z5111 Encounter for antineoplastic chemotherapy: Secondary | ICD-10-CM | POA: Diagnosis not present

## 2021-08-05 MED ORDER — TRASTUZUMAB-ANNS CHEMO 150 MG IV SOLR
6.0000 mg/kg | Freq: Once | INTRAVENOUS | Status: AC
  Administered 2021-08-05: 399 mg via INTRAVENOUS
  Filled 2021-08-05: qty 19

## 2021-08-05 MED ORDER — DIPHENHYDRAMINE HCL 25 MG PO CAPS
25.0000 mg | ORAL_CAPSULE | Freq: Once | ORAL | Status: AC
  Administered 2021-08-05: 25 mg via ORAL
  Filled 2021-08-05: qty 1

## 2021-08-05 MED ORDER — HEPARIN SOD (PORK) LOCK FLUSH 100 UNIT/ML IV SOLN
500.0000 [IU] | Freq: Once | INTRAVENOUS | Status: AC | PRN
  Administered 2021-08-05: 500 [IU]

## 2021-08-05 MED ORDER — ACETAMINOPHEN 325 MG PO TABS
650.0000 mg | ORAL_TABLET | Freq: Once | ORAL | Status: AC
  Administered 2021-08-05: 650 mg via ORAL
  Filled 2021-08-05: qty 2

## 2021-08-05 MED ORDER — SODIUM CHLORIDE 0.9 % IV SOLN: Freq: Once | INTRAVENOUS | Status: AC

## 2021-08-05 MED ORDER — SODIUM CHLORIDE 0.9% FLUSH
10.0000 mL | INTRAVENOUS | Status: DC | PRN
  Administered 2021-08-05: 10 mL

## 2021-08-05 MED ORDER — SODIUM CHLORIDE 0.9 % IV SOLN
420.0000 mg | Freq: Once | INTRAVENOUS | Status: AC
  Administered 2021-08-05: 420 mg via INTRAVENOUS
  Filled 2021-08-05: qty 14

## 2021-08-05 NOTE — Patient Instructions (Signed)
[facility] ONCOLOGY  Discharge Instructions: Thank you for choosing [facility] to provide your oncology and hematology care.   If you have a lab appointment with the [facility], please go directly to the [facility] and check in at the registration area.   Wear comfortable clothing and clothing appropriate for easy access to any Portacath or PICC line.   We strive to give you quality time with your provider. You may need to reschedule your appointment if you arrive late (15 or more minutes).  Arriving late affects you and other patients whose appointments are after yours.  Also, if you miss three or more appointments without notifying the office, you may be dismissed from the clinic at the providers discretion.      For prescription refill requests, have your pharmacy contact our office and allow 72 hours for refills to be completed.    Today you received the following chemotherapy and/or immunotherapy agents: Trastuzumab (Kanjinti) and Pertuzumab (Perjeta).   To help prevent nausea and vomiting after your treatment, we encourage you to take your nausea medication as directed.  BELOW ARE SYMPTOMS THAT SHOULD BE REPORTED IMMEDIATELY: *FEVER GREATER THAN 100.4 F (38 C) OR HIGHER *CHILLS OR SWEATING *NAUSEA AND VOMITING THAT IS NOT CONTROLLED WITH YOUR NAUSEA MEDICATION *UNUSUAL SHORTNESS OF BREATH *UNUSUAL BRUISING OR BLEEDING *URINARY PROBLEMS (pain or burning when urinating, or frequent urination) *BOWEL PROBLEMS (unusual diarrhea, constipation, pain near the anus) TENDERNESS IN MOUTH AND THROAT WITH OR WITHOUT PRESENCE OF ULCERS (sore throat, sores in mouth, or a toothache) UNUSUAL RASH, SWELLING OR PAIN  UNUSUAL VAGINAL DISCHARGE OR ITCHING   Items with * indicate a potential emergency and should be followed up as soon as possible or go to the Emergency Department if any problems should occur.  Please show the CHEMOTHERAPY ALERT CARD or  IMMUNOTHERAPY ALERT CARD at check-in to the Emergency Department and triage nurse.  Should you have questions after your visit or need to cancel or reschedule your appointment, please contact [facility]  Dept: [phone]  and follow the prompts.  Office hours are 8:00 a.m. to 4:30 p.m. Monday - Friday. Please note that voicemails left after 4:00 p.m. may not be returned until the following business day.  We are closed weekends and major holidays. You have access to a nurse at all times for urgent questions. Please call the main number to the clinic Dept: [phone] and follow the prompts.   For any non-urgent questions, you may also contact your provider using MyChart. We now offer e-Visits for anyone 61 and older to request care online for non-urgent symptoms. For details visit [email or web address].   Also download the MyChart app! Go to the app store, search "MyChart", open the app, select [facility], and log in with your MyChart username and password.  Due to Covid, a mask is required upon entering the hospital/clinic. If you do not have a mask, one will be given to you upon arrival. For doctor visits, patients may have 1 support person aged 20 or older with them. For treatment visits, patients cannot have anyone with them due to current Covid guidelines and our immunocompromised population.

## 2021-08-08 ENCOUNTER — Inpatient Hospital Stay: Payer: Commercial Managed Care - HMO

## 2021-08-14 ENCOUNTER — Other Ambulatory Visit: Payer: Self-pay

## 2021-08-14 ENCOUNTER — Ambulatory Visit (HOSPITAL_COMMUNITY)
Admission: RE | Admit: 2021-08-14 | Discharge: 2021-08-14 | Disposition: A | Discharge status: Home / Self Care | Payer: Commercial Managed Care - HMO | Source: Ambulatory Visit | Attending: Hematology and Oncology | Admitting: Hematology and Oncology

## 2021-08-14 DIAGNOSIS — Z0189 Encounter for other specified special examinations: Secondary | ICD-10-CM

## 2021-08-14 DIAGNOSIS — I071 Rheumatic tricuspid insufficiency: Secondary | ICD-10-CM | POA: Diagnosis not present

## 2021-08-14 DIAGNOSIS — Z5181 Encounter for therapeutic drug level monitoring: Secondary | ICD-10-CM | POA: Insufficient documentation

## 2021-08-14 DIAGNOSIS — I10 Essential (primary) hypertension: Secondary | ICD-10-CM | POA: Insufficient documentation

## 2021-08-14 DIAGNOSIS — Z79899 Other long term (current) drug therapy: Secondary | ICD-10-CM | POA: Insufficient documentation

## 2021-08-14 DIAGNOSIS — I3139 Other pericardial effusion (noninflammatory): Secondary | ICD-10-CM | POA: Diagnosis not present

## 2021-08-14 LAB — ECHOCARDIOGRAM COMPLETE
Area-P 1/2: 3.21 cm2
Calc EF: 56.4 %
S' Lateral: 3.2 cm
Single Plane A2C EF: 54.5 %
Single Plane A4C EF: 55.7 %

## 2021-08-14 NOTE — Progress Notes (Signed)
?  Echocardiogram ?2D Echocardiogram has been performed. ? ?Tammie [name] ?08/14/2021, 11:19 AM ?

## 2021-08-22 ENCOUNTER — Ambulatory Visit: Payer: 59 | Admitting: Hematology and Oncology

## 2021-08-22 ENCOUNTER — Ambulatory Visit: Payer: 59

## 2021-08-22 ENCOUNTER — Other Ambulatory Visit: Payer: 59

## 2021-08-26 ENCOUNTER — Inpatient Hospital Stay: Payer: Commercial Managed Care - HMO | Admitting: Adult Health

## 2021-08-26 ENCOUNTER — Encounter: Payer: Self-pay | Admitting: Adult Health

## 2021-08-26 ENCOUNTER — Other Ambulatory Visit: Payer: Self-pay

## 2021-08-26 ENCOUNTER — Inpatient Hospital Stay: Payer: Commercial Managed Care - HMO | Attending: Hematology and Oncology

## 2021-08-26 ENCOUNTER — Inpatient Hospital Stay: Payer: Commercial Managed Care - HMO

## 2021-08-26 VITALS — BP 111/53 | HR 77 | Temp 97.7°F | Resp 16 | Ht 63.0 in | Wt 153.9 lb

## 2021-08-26 DIAGNOSIS — Z923 Personal history of irradiation: Secondary | ICD-10-CM | POA: Diagnosis not present

## 2021-08-26 DIAGNOSIS — Z9221 Personal history of antineoplastic chemotherapy: Secondary | ICD-10-CM | POA: Insufficient documentation

## 2021-08-26 DIAGNOSIS — Z170 Estrogen receptor positive status [ER+]: Secondary | ICD-10-CM

## 2021-08-26 DIAGNOSIS — C50512 Malignant neoplasm of lower-outer quadrant of left female breast: Secondary | ICD-10-CM | POA: Diagnosis present

## 2021-08-26 DIAGNOSIS — Z95828 Presence of other vascular implants and grafts: Secondary | ICD-10-CM

## 2021-08-26 DIAGNOSIS — Z79899 Other long term (current) drug therapy: Secondary | ICD-10-CM | POA: Diagnosis not present

## 2021-08-26 DIAGNOSIS — Z5111 Encounter for antineoplastic chemotherapy: Secondary | ICD-10-CM | POA: Diagnosis present

## 2021-08-26 LAB — CBC WITH DIFFERENTIAL (CANCER CENTER ONLY)
Abs Immature Granulocytes: 0.01 10*3/uL (ref 0.00–0.07)
Basophils Absolute: 0 10*3/uL (ref 0.0–0.1)
Basophils Relative: 0 %
Eosinophils Absolute: 0.2 10*3/uL (ref 0.0–0.5)
Eosinophils Relative: 4 %
HCT: 32.3 % — ABNORMAL LOW (ref 36.0–46.0)
Hemoglobin: 10.8 g/dL — ABNORMAL LOW (ref 12.0–15.0)
Immature Granulocytes: 0 %
Lymphocytes Relative: 26 %
Lymphs Abs: 1.1 10*3/uL (ref 0.7–4.0)
MCH: 29.3 pg (ref 26.0–34.0)
MCHC: 33.4 g/dL (ref 30.0–36.0)
MCV: 87.5 fL (ref 80.0–100.0)
Monocytes Absolute: 0.4 10*3/uL (ref 0.1–1.0)
Monocytes Relative: 11 %
Neutro Abs: 2.5 10*3/uL (ref 1.7–7.7)
Neutrophils Relative %: 59 %
Platelet Count: 186 10*3/uL (ref 150–400)
RBC: 3.69 MIL/uL — ABNORMAL LOW (ref 3.87–5.11)
RDW: 14.6 % (ref 11.5–15.5)
WBC Count: 4.2 10*3/uL (ref 4.0–10.5)
nRBC: 0 % (ref 0.0–0.2)

## 2021-08-26 LAB — CMP (CANCER CENTER ONLY)
ALT: 16 U/L (ref 0–44)
AST: 19 U/L (ref 15–41)
Albumin: 4.2 g/dL (ref 3.5–5.0)
Alkaline Phosphatase: 58 U/L (ref 38–126)
Anion gap: 8 (ref 5–15)
BUN: 20 mg/dL (ref 6–20)
CO2: 28 mmol/L (ref 22–32)
Calcium: 9.4 mg/dL (ref 8.9–10.3)
Chloride: 103 mmol/L (ref 98–111)
Creatinine: 0.75 mg/dL (ref 0.44–1.00)
GFR, Estimated: >60 mL/min (ref >60)
Glucose, Bld: 105 mg/dL — ABNORMAL HIGH (ref 70–99)
Potassium: 3.6 mmol/L (ref 3.5–5.1)
Sodium: 139 mmol/L (ref 135–145)
Total Bilirubin: 0.3 mg/dL (ref 0.3–1.2)
Total Protein: 6.7 g/dL (ref 6.5–8.1)

## 2021-08-26 MED ORDER — SODIUM CHLORIDE 0.9 % IV SOLN: Freq: Once | INTRAVENOUS | Status: AC

## 2021-08-26 MED ORDER — HEPARIN SOD (PORK) LOCK FLUSH 100 UNIT/ML IV SOLN
500.0000 [IU] | Freq: Once | INTRAVENOUS | Status: AC | PRN
  Administered 2021-08-26: 500 [IU]

## 2021-08-26 MED ORDER — DIPHENHYDRAMINE HCL 25 MG PO CAPS
25.0000 mg | ORAL_CAPSULE | Freq: Once | ORAL | Status: AC
  Administered 2021-08-26: 25 mg via ORAL
  Filled 2021-08-26: qty 1

## 2021-08-26 MED ORDER — SODIUM CHLORIDE 0.9 % IV SOLN
420.0000 mg | Freq: Once | INTRAVENOUS | Status: AC
  Administered 2021-08-26: 420 mg via INTRAVENOUS
  Filled 2021-08-26: qty 14

## 2021-08-26 MED ORDER — SODIUM CHLORIDE 0.9% FLUSH
10.0000 mL | Freq: Once | INTRAVENOUS | Status: AC
  Administered 2021-08-26: 10 mL

## 2021-08-26 MED ORDER — SODIUM CHLORIDE 0.9% FLUSH
10.0000 mL | INTRAVENOUS | Status: DC | PRN
  Administered 2021-08-26: 10 mL

## 2021-08-26 MED ORDER — ACETAMINOPHEN 325 MG PO TABS
650.0000 mg | ORAL_TABLET | Freq: Once | ORAL | Status: AC
  Administered 2021-08-26: 650 mg via ORAL
  Filled 2021-08-26: qty 2

## 2021-08-26 MED ORDER — TRASTUZUMAB-ANNS CHEMO 150 MG IV SOLR
6.0000 mg/kg | Freq: Once | INTRAVENOUS | Status: AC
  Administered 2021-08-26: 399 mg via INTRAVENOUS
  Filled 2021-08-26: qty 19

## 2021-08-26 NOTE — Progress Notes (Signed)
[facility] Follow up: ?  ? ?Tammie Hickman, FNP ?[place] [address] ? ? ?DIAGNOSIS:  Cancer Staging  ?Malignant neoplasm of lower-outer quadrant of left breast of female, estrogen receptor positive ([facility]) ?Staging form: Breast, AJCC 8th Edition ?- Clinical stage from 10/09/2020: Stage IB (cT2, cN1, cM0, G3, ER+, PR+, HER2+) - Signed by Nicholas Lose, MD on 10/09/2020 ?Stage prefix: Initial diagnosis ?Histologic grading system: 3 grade system ?Percentage of positive estrogen receptors (%): 5 ?Percentage of positive progesterone receptors (%): 1 ?Ki-67 (%): 20 ? ? ?SUMMARY OF ONCOLOGIC HISTORY: ?Oncology History  ?Malignant neoplasm of lower-outer quadrant of left breast of female, estrogen receptor positive ([facility])  ?10/01/2020 Initial Diagnosis  ? Screening mammogram showed a left breast mass and calcifications. Diagnostic mammogram and US showed a 1.7cm and 0.5cm mass at the 5 o'clock position in the left breast, with one 0.4cm abnormal right axillary lymph node. 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 ?  ?10/09/2020 Cancer Staging  ? Staging form: Breast, AJCC 8th Edition ?- Clinical stage from 10/09/2020: Stage IB (cT2, cN1, cM0, G3, ER+, PR+, HER2+) - Signed by Nicholas Lose, MD on 10/09/2020 ?Stage prefix: Initial diagnosis ?Histologic grading system: 3 grade system ?Percentage of positive estrogen receptors (%): 5 ?Percentage of positive progesterone receptors (%): 1 ?Ki-67 (%): 20 ? ?  ?10/18/2020 - 02/27/2021 Chemotherapy  ? Patient is on Treatment Plan : BREAST  Docetaxel + Carboplatin + Trastuzumab + Pertuzumab  (TCHP) q21d   ?   ?10/23/2020 Genetic Testing  ? Negative genetic testing:  No pathogenic variants detected on the [facility] CancerNext-Expanded + RNAinsight panel. The report date is 10/23/2020.  ? ?The CancerNext-Expanded + RNAinsight gene panel offered by [employer] and includes sequencing and  rearrangement analysis for the following 77 genes: AIP, ALK, APC, ATM, AXIN2, BAP1, BARD1, BLM, BMPR1A, BRCA1, BRCA2, BRIP1, CDC73, CDH1, CDK4, CDKN1B, CDKN2A, CHEK2, CTNNA1, DICER1, FANCC, FH, FLCN, GALNT12, KIF1B, LZTR1, MAX, MEN1, MET, MLH1, MSH2, MSH3, MSH6, MUTYH, NBN, NF1, NF2, NTHL1, PALB2, PHOX2B, PMS2, POT1, PRKAR1A, PTCH1, PTEN, RAD51C, RAD51D, RB1, RECQL, RET, SDHA, SDHAF2, SDHB, SDHC, SDHD, SMAD4, SMARCA4, SMARCB1, SMARCE1, STK11, SUFU, TMEM127, TP53, TSC1, TSC2, VHL and XRCC2 (sequencing and deletion/duplication); EGFR, EGLN1, HOXB13, KIT, MITF, PDGFRA, POLD1 and POLE (sequencing only); EPCAM and GREM1 (deletion/duplication only). RNA data is routinely analyzed for use in variant interpretation for all genes. ?  ?03/31/2021 -  Chemotherapy  ? Patient is on Treatment Plan : BREAST Trastuzumab  + Pertuzumab q21d x 13 cycles  ?   ? ? ?CURRENT THERAPY: Herceptin Perjeta ? ?INTERVAL HISTORY: ?Tammie [name] 46 y.o. female returns for evaluation prior to receiving Herceptin and Perjeta today.  She is tolerating treatment well.  Her most recent echocardiogram was completed on August 14, 2021 and showed a normal EF of 55 to 60%. ? ?She had a left axillary nodule noted by Dr. Lindi Adie and this was ultrasounded on July 21, 2021 and showed a 3.2 x 2.7 x 1.6 axillary mass with mild peripheral vascularity.  This was noted as a probably benign postoperative seroma within the left axilla and ultrasound in 6 months was recommended. ? ? ?Patient Active Problem List  ? Diagnosis Date Noted  ? Port-A-Cath in place 12/20/2020  ? Genetic testing 10/23/2020  ? Family history of breast cancer   ? Family history of prostate cancer   ? Family history  of stomach cancer   ? Family history of leukemia   ? Malignant neoplasm of lower-outer quadrant of left breast of female, estrogen receptor positive ([facility]) 10/03/2020  ? ? ?has No Known Allergies. ? ?MEDICAL HISTORY: ?Past Medical History:  ?Diagnosis Date  ? Anxiety   ?  Arrhythmia   ? palpitation  ? Breast cancer ([facility])   ? Depression   ? Family history of breast cancer   ? Family history of leukemia   ? Family history of prostate cancer   ? Family history of stomach cancer   ? Fibroid, uterine   ? Fibromyalgia   ? History of radiation therapy   ? Left breast- 04/15/21-05/30/21- Dr. Gery Pray  ? Hx of degenerative disc disease   ? Hypertension   ? Interstitial cystitis   ? Microadenoma   ? Migraines   ? PONV (postoperative nausea and vomiting)   ? Pre-diabetes   ? ? ?SURGICAL HISTORY: ?Past Surgical History:  ?Procedure Laterality Date  ? BREAST LUMPECTOMY Left 03/12/2021  ? BREAST LUMPECTOMY WITH RADIOACTIVE SEED AND SENTINEL LYMPH NODE BIOPSY Left 03/12/2021  ? Procedure: LEFT BREAST LUMPECTOMY WITH RADIOACTIVE SEED X2 AND LEFT SENTINEL LYMPH NODE BIOPSY;  Surgeon: Donnie Mesa, MD;  Location: [facility];  Service: General;  Laterality: Left;  ? CHOLECYSTECTOMY    ? PORTACATH PLACEMENT Right 10/17/2020  ? Procedure: INSERTION PORT-A-CATH, ULTRASOUND GUIDED;  Surgeon: Donnie Mesa, MD;  Location: [facility];  Service: General;  Laterality: Right;  ? RADIOACTIVE SEED GUIDED AXILLARY SENTINEL LYMPH NODE Left 03/12/2021  ? Procedure: RADIOACTIVE SEED GUIDED LEFT AXILLARY SENTINEL LYMPH NODE BIOPSY;  Surgeon: Donnie Mesa, MD;  Location: [facility];  Service: General;  Laterality: Left;  ? ? ?SOCIAL HISTORY: ?Social History  ? ?Socioeconomic History  ? Marital status: Married  ?  Spouse name: Not on file  ? Number of children: Not on file  ? Years of education: Not on file  ? Highest education level: Not on file  ?Occupational History  ? Not on file  ?Tobacco Use  ? Smoking status: Never  ? Smokeless tobacco: Never  ?Vaping Use  ? Vaping Use: Never used  ?Substance and Sexual Activity  ? Alcohol use: Never  ? Drug use: Never  ? Sexual activity: Not on file  ?Other Topics Concern  ? Not on file  ?Social History Narrative  ? Not on  file  ? ?Social Determinants of Health  ? ?Financial Resource Strain: Not on file  ?Food Insecurity: Not on file  ?Transportation Needs: Not on file  ?Physical Activity: Not on file  ?Stress: Not on file  ?Social Connections: Not on file  ?Intimate Partner Violence: Not on file  ? ? ?FAMILY HISTORY: ?Family History  ?Problem Relation Age of Onset  ? Breast cancer Mother 5  ? Prostate cancer Father 46  ? Stomach cancer Paternal Grandmother   ?     dx older than 47  ? Leukemia Paternal Grandfather 58  ?     dx older than 53  ? Breast cancer Paternal Aunt   ? Breast cancer Other   ?     mother's first cousin  ? ? ?Review of Systems  ?Constitutional:  Negative for appetite change, chills, fatigue, fever and unexpected weight change.  ?HENT:   Negative for hearing loss, lump/mass and trouble swallowing.   ?Eyes:  Negative for eye problems and icterus.  ?Respiratory:  Negative for chest tightness, cough and shortness of  breath.   ?Cardiovascular:  Negative for chest pain, leg swelling and palpitations.  ?Gastrointestinal:  Negative for abdominal distention, abdominal pain, constipation, diarrhea, nausea and vomiting.  ?Endocrine: Negative for hot flashes.  ?Genitourinary:  Negative for difficulty urinating.   ?Musculoskeletal:  Negative for arthralgias.  ?Skin:  Negative for itching and rash.  ?Neurological:  Negative for dizziness, extremity weakness, headaches and numbness.  ?Hematological:  Negative for adenopathy. Does not bruise/bleed easily.  ?Psychiatric/Behavioral:  Negative for depression. The patient is not nervous/anxious.    ? ? ?PHYSICAL EXAMINATION ? ?ECOG PERFORMANCE STATUS: 1 - Symptomatic but completely ambulatory ? ?Vitals:  ? 08/26/21 0934  ?BP: (!) 111/53  ?Pulse: 77  ?Resp: 16  ?Temp: 97.7 ?F (36.5 ?C)  ?SpO2: 100%  ? ? ?Physical Exam ?Constitutional:   ?   General: She is not in acute distress. ?   Appearance: Normal appearance. She is not toxic-appearing.  ?HENT:  ?   Head: Normocephalic and  atraumatic.  ?Eyes:  ?   General: No scleral icterus. ?Cardiovascular:  ?   Rate and Rhythm: Normal rate and regular rhythm.  ?   Pulses: Normal pulses.  ?   Heart sounds: Normal heart sounds.  ?Pulmonary:  ?   Effort:

## 2021-08-26 NOTE — Patient Instructions (Signed)
[facility]  Discharge Instructions: ?Thank you for choosing [facility] to provide your oncology and hematology care.  ? ?If you have a lab appointment with the [facility], please go directly to the [facility] and check in at the registration area. ?  ?Wear comfortable clothing and clothing appropriate for easy access to any Portacath or PICC line.  ? ?We strive to give you quality time with your provider. You may need to reschedule your appointment if you arrive late (15 or more minutes).  Arriving late affects you and other patients whose appointments are after yours.  Also, if you miss three or more appointments without notifying the office, you may be dismissed from the clinic at the provider?s discretion.    ?  ?For prescription refill requests, have your pharmacy contact our office and allow 72 hours for refills to be completed.   ? ?Today you received the following chemotherapy and/or immunotherapy agents: Trastuzumab (Kanjinti) and Pertuzumab (Perjeta). ?  ?To help prevent nausea and vomiting after your treatment, we encourage you to take your nausea medication as directed. ? ?BELOW ARE SYMPTOMS THAT SHOULD BE REPORTED IMMEDIATELY: ?*FEVER GREATER THAN 100.4 F (38 ?C) OR HIGHER ?*CHILLS OR SWEATING ?*NAUSEA AND VOMITING THAT IS NOT CONTROLLED WITH YOUR NAUSEA MEDICATION ?*UNUSUAL SHORTNESS OF BREATH ?*UNUSUAL BRUISING OR BLEEDING ?*URINARY PROBLEMS (pain or burning when urinating, or frequent urination) ?*BOWEL PROBLEMS (unusual diarrhea, constipation, pain near the anus) ?TENDERNESS IN MOUTH AND THROAT WITH OR WITHOUT PRESENCE OF ULCERS (sore throat, sores in mouth, or a toothache) ?UNUSUAL RASH, SWELLING OR PAIN  ?UNUSUAL VAGINAL DISCHARGE OR ITCHING  ? ?Items with * indicate a potential emergency and should be followed up as soon as possible or go to the Emergency Department if any problems should occur. ? ?Please show the CHEMOTHERAPY ALERT CARD or  IMMUNOTHERAPY ALERT CARD at check-in to the Emergency Department and triage nurse. ? ?Should you have questions after your visit or need to cancel or reschedule your appointment, please contact [facility]  Dept: [phone]  and follow the prompts.  Office hours are 8:00 a.m. to 4:30 p.m. Monday - Friday. Please note that voicemails left after 4:00 p.m. may not be returned until the following business day.  We are closed weekends and major holidays. You have access to a nurse at all times for urgent questions. Please call the main number to the clinic Dept: [phone] and follow the prompts. ? ? ?For any non-urgent questions, you may also contact your provider using MyChart. We now offer e-Visits for anyone 61 and older to request care online for non-urgent symptoms. For details visit [email or web address]. ?  ?Also download the MyChart app! Go to the app store, search "MyChart", open the app, select [facility], and log in with your MyChart username and password. ? ?Due to Covid, a mask is required upon entering the hospital/clinic. If you do not have a mask, one will be given to you upon arrival. For doctor visits, patients may have 1 support person aged 76 or older with them. For treatment visits, patients cannot have anyone with them due to current Covid guidelines and our immunocompromised population.  ? ?

## 2021-08-26 NOTE — Assessment & Plan Note (Addendum)
10/01/2020:Screening mammogram showed a left breast mass and calcifications. Diagnostic mammogram and US showed a 1.7cm and 0.5cm mass at the 5 o'clock position in the left breast, with one 0.4cm abnormal right axillary lymph node. 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 ?? ?Treatment Plan: ?1. Neoadjuvant chemotherapy with [place] Perjeta ?6 cycles followed by Herceptin Perjeta maintenance for 1 year ?2.?03/12/2021: Pathologic complete response, 0/6 lymph nodes negative ?3. Followed by adjuvant radiation therapy?completed 05/30/2021 ?4.??Followed by antiestrogen therapy although ER is weak ?[facility] nausea study ?Breast MRI: 10/17/20:?2.4 cm left breast mass, intramammary lymph node ?------------------------------------------------------------------------------------------------------------------------- ?Current treatment:?Completed 6 cycles of?TCHP, currently on Herceptin and Perjeta maintenance (will complete June 2023) ?? ?Radiation completed 05/30/2021  ?? ?Tammie [name] is here today for follow-up of her stage Ib triple positive breast cancer.  She continues on Herceptin Perjeta every 3 weeks with good tolerance.  She will continue this.  She has not yet started antiestrogen therapy and tells me that Dr. Lindi Adie wanted to start this in April. ? ?Her most recent echo was normal and I reviewed this with her in detail.  We also updated her vaccination history. ? ?We discussed healthy diet and exercise.  Her left axillary nodule has resolved. ? ?She will return in 3 weeks for Herceptin Perjeta only and in 6 weeks for labs, follow-up with Dr. Lindi Adie, Herceptin Perjeta only. ? ?

## 2021-08-27 ENCOUNTER — Telehealth: Payer: Self-pay | Admitting: Adult Health

## 2021-08-27 NOTE — Telephone Encounter (Signed)
Scheduled appointment per 3/14 los. Patient is aware. ?

## 2021-08-29 ENCOUNTER — Ambulatory Visit: Payer: Self-pay | Admitting: Hematology and Oncology

## 2021-08-29 ENCOUNTER — Ambulatory Visit: Payer: Self-pay

## 2021-08-29 ENCOUNTER — Other Ambulatory Visit: Payer: Self-pay

## 2021-09-08 ENCOUNTER — Ambulatory Visit: Payer: Managed Care, Other (non HMO)

## 2021-09-12 ENCOUNTER — Ambulatory Visit: Payer: 59

## 2021-09-16 ENCOUNTER — Other Ambulatory Visit: Payer: Self-pay

## 2021-09-16 ENCOUNTER — Other Ambulatory Visit: Payer: Self-pay | Admitting: Hematology and Oncology

## 2021-09-16 ENCOUNTER — Inpatient Hospital Stay: Payer: Commercial Managed Care - HMO | Attending: Hematology and Oncology

## 2021-09-16 VITALS — BP 98/68 | HR 80 | Temp 98.0°F | Resp 18 | Wt 154.2 lb

## 2021-09-16 DIAGNOSIS — Z5111 Encounter for antineoplastic chemotherapy: Secondary | ICD-10-CM | POA: Diagnosis present

## 2021-09-16 DIAGNOSIS — Z170 Estrogen receptor positive status [ER+]: Secondary | ICD-10-CM | POA: Diagnosis not present

## 2021-09-16 DIAGNOSIS — Z79899 Other long term (current) drug therapy: Secondary | ICD-10-CM | POA: Diagnosis not present

## 2021-09-16 DIAGNOSIS — C50512 Malignant neoplasm of lower-outer quadrant of left female breast: Secondary | ICD-10-CM | POA: Diagnosis not present

## 2021-09-16 DIAGNOSIS — Z923 Personal history of irradiation: Secondary | ICD-10-CM | POA: Diagnosis not present

## 2021-09-16 DIAGNOSIS — Z9221 Personal history of antineoplastic chemotherapy: Secondary | ICD-10-CM | POA: Diagnosis not present

## 2021-09-16 MED ORDER — ONDANSETRON HCL 8 MG PO TABS: 8.0000 mg | ORAL_TABLET | Freq: Three times a day (TID) | ORAL | 3 refills | Status: DC | PRN

## 2021-09-16 MED ORDER — TRASTUZUMAB-ANNS CHEMO 150 MG IV SOLR
6.0000 mg/kg | Freq: Once | INTRAVENOUS | Status: AC
  Administered 2021-09-16: 399 mg via INTRAVENOUS
  Filled 2021-09-16: qty 19

## 2021-09-16 MED ORDER — HEPARIN SOD (PORK) LOCK FLUSH 100 UNIT/ML IV SOLN
500.0000 [IU] | Freq: Once | INTRAVENOUS | Status: AC | PRN
  Administered 2021-09-16: 500 [IU]

## 2021-09-16 MED ORDER — LORATADINE 10 MG PO TABS
10.0000 mg | ORAL_TABLET | Freq: Once | ORAL | Status: AC
  Administered 2021-09-16: 10 mg via ORAL
  Filled 2021-09-16: qty 1

## 2021-09-16 MED ORDER — SODIUM CHLORIDE 0.9 % IV SOLN
420.0000 mg | Freq: Once | INTRAVENOUS | Status: AC
  Administered 2021-09-16: 420 mg via INTRAVENOUS
  Filled 2021-09-16: qty 14

## 2021-09-16 MED ORDER — DIPHENHYDRAMINE HCL 25 MG PO CAPS
25.0000 mg | ORAL_CAPSULE | Freq: Once | ORAL | Status: DC
  Filled 2021-09-16: qty 1

## 2021-09-16 MED ORDER — SODIUM CHLORIDE 0.9% FLUSH
10.0000 mL | INTRAVENOUS | Status: DC | PRN
  Administered 2021-09-16: 10 mL

## 2021-09-16 MED ORDER — ACETAMINOPHEN 325 MG PO TABS
650.0000 mg | ORAL_TABLET | Freq: Once | ORAL | Status: AC
  Administered 2021-09-16: 650 mg via ORAL
  Filled 2021-09-16: qty 2

## 2021-09-16 MED ORDER — SODIUM CHLORIDE 0.9 % IV SOLN: Freq: Once | INTRAVENOUS | Status: AC

## 2021-09-16 NOTE — Patient Instructions (Signed)
[facility]  Discharge Instructions: ?Thank you for choosing [facility] to provide your oncology and hematology care.  ? ?If you have a lab appointment with the [facility], please go directly to the [facility] and check in at the registration area. ?  ?Wear comfortable clothing and clothing appropriate for easy access to any Portacath or PICC line.  ? ?We strive to give you quality time with your provider. You may need to reschedule your appointment if you arrive late (15 or more minutes).  Arriving late affects you and other patients whose appointments are after yours.  Also, if you miss three or more appointments without notifying the office, you may be dismissed from the clinic at the provider?s discretion.    ?  ?For prescription refill requests, have your pharmacy contact our office and allow 72 hours for refills to be completed.   ? ?Today you received the following chemotherapy and/or immunotherapy agents herceptin, perjeta    ?  ?To help prevent nausea and vomiting after your treatment, we encourage you to take your nausea medication as directed. ? ?BELOW ARE SYMPTOMS THAT SHOULD BE REPORTED IMMEDIATELY: ?*FEVER GREATER THAN 100.4 F (38 ?C) OR HIGHER ?*CHILLS OR SWEATING ?*NAUSEA AND VOMITING THAT IS NOT CONTROLLED WITH YOUR NAUSEA MEDICATION ?*UNUSUAL SHORTNESS OF BREATH ?*UNUSUAL BRUISING OR BLEEDING ?*URINARY PROBLEMS (pain or burning when urinating, or frequent urination) ?*BOWEL PROBLEMS (unusual diarrhea, constipation, pain near the anus) ?TENDERNESS IN MOUTH AND THROAT WITH OR WITHOUT PRESENCE OF ULCERS (sore throat, sores in mouth, or a toothache) ?UNUSUAL RASH, SWELLING OR PAIN  ?UNUSUAL VAGINAL DISCHARGE OR ITCHING  ? ?Items with * indicate a potential emergency and should be followed up as soon as possible or go to the Emergency Department if any problems should occur. ? ?Please show the CHEMOTHERAPY ALERT CARD or IMMUNOTHERAPY ALERT CARD at  check-in to the Emergency Department and triage nurse. ? ?Should you have questions after your visit or need to cancel or reschedule your appointment, please contact [facility]  Dept: [phone]  and follow the prompts.  Office hours are 8:00 a.m. to 4:30 p.m. Monday - Friday. Please note that voicemails left after 4:00 p.m. may not be returned until the following business day.  We are closed weekends and major holidays. You have access to a nurse at all times for urgent questions. Please call the main number to the clinic Dept: [phone] and follow the prompts. ? ? ?For any non-urgent questions, you may also contact your provider using MyChart. We now offer e-Visits for anyone 107 and older to request care online for non-urgent symptoms. For details visit [email or web address]. ?  ?Also download the MyChart app! Go to the app store, search "MyChart", open the app, select [facility], and log in with your MyChart username and password. ? ?Due to Covid, a mask is required upon entering the hospital/clinic. If you do not have a mask, one will be given to you upon arrival. For doctor visits, patients may have 1 support person aged 77 or older with them. For treatment visits, patients cannot have anyone with them due to current Covid guidelines and our immunocompromised population.  ? ?

## 2021-09-19 ENCOUNTER — Ambulatory Visit: Payer: Self-pay

## 2021-09-23 NOTE — Progress Notes (Signed)
? ?Patient Care Team: ?[name], FNP as PCP - General (Family Medicine) ?Donnie Mesa, MD as Consulting Physician (General Surgery) ?Nicholas Lose, MD as Consulting Physician (Hematology and Oncology) ?Gery Pray, MD as Consulting Physician (Radiation Oncology) ? ?DIAGNOSIS:  ?Encounter Diagnosis  ?Name Primary?  ? Malignant neoplasm of lower-outer quadrant of left breast of female, estrogen receptor positive ([place])   ? ? ?SUMMARY OF ONCOLOGIC HISTORY: ?Oncology History  ?Malignant neoplasm of lower-outer quadrant of left breast of female, estrogen receptor positive ([place])  ?10/01/2020 Initial Diagnosis  ? Screening mammogram showed a left breast mass and calcifications. Diagnostic mammogram and US showed a 1.7cm and 0.5cm mass at the 5 o'clock position in the left breast, with one 0.4cm abnormal right axillary lymph node. 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 ?  ?10/09/2020 Cancer Staging  ? Staging form: Breast, AJCC 8th Edition ?- Clinical stage from 10/09/2020: Stage IB (cT2, cN1, cM0, G3, ER+, PR+, HER2+) - Signed by Nicholas Lose, MD on 10/09/2020 ?Stage prefix: Initial diagnosis ?Histologic grading system: 3 grade system ?Percentage of positive estrogen receptors (%): 5 ?Percentage of positive progesterone receptors (%): 1 ?Ki-67 (%): 20 ? ?  ?10/18/2020 - 02/27/2021 Chemotherapy  ? Patient is on Treatment Plan : BREAST  Docetaxel + Carboplatin + Trastuzumab + Pertuzumab  (TCHP) q21d   ? ?  ?  ?10/23/2020 Genetic Testing  ? Negative genetic testing:  No pathogenic variants detected on the [facility] CancerNext-Expanded + RNAinsight panel. The report date is 10/23/2020.  ? ?The CancerNext-Expanded + RNAinsight gene panel offered by [employer] and includes sequencing and rearrangement analysis for the following 77 genes: AIP, ALK, APC, ATM, AXIN2, BAP1, BARD1, BLM, BMPR1A, BRCA1, BRCA2, BRIP1, CDC73, CDH1, CDK4, CDKN1B, CDKN2A, CHEK2,  CTNNA1, DICER1, FANCC, FH, FLCN, GALNT12, KIF1B, LZTR1, MAX, MEN1, MET, MLH1, MSH2, MSH3, MSH6, MUTYH, NBN, NF1, NF2, NTHL1, PALB2, PHOX2B, PMS2, POT1, PRKAR1A, PTCH1, PTEN, RAD51C, RAD51D, RB1, RECQL, RET, SDHA, SDHAF2, SDHB, SDHC, SDHD, SMAD4, SMARCA4, SMARCB1, SMARCE1, STK11, SUFU, TMEM127, TP53, TSC1, TSC2, VHL and XRCC2 (sequencing and deletion/duplication); EGFR, EGLN1, HOXB13, KIT, MITF, PDGFRA, POLD1 and POLE (sequencing only); EPCAM and GREM1 (deletion/duplication only). RNA data is routinely analyzed for use in variant interpretation for all genes. ?  ?03/31/2021 -  Chemotherapy  ? Patient is on Treatment Plan : BREAST Trastuzumab  + Pertuzumab q21d x 13 cycles  ? ?  ?  ? ? ?CHIEF COMPLIANT: Herceptin and Pertuzumab maintenance ? ?INTERVAL HISTORY: Tammie [name] is a 46 y.o. with above-mentioned history of left breast cancer who completed neoadjuvant chemotherapy. She presents to the clinic today for treatment. She states that she has some nausea. She concern about numbness in both hands and her feet. She states that is painful. She is tolerating the herceptin but she had some constipation. She also states that she has hot flashes. She states the hot flashes are severe. ? ? ?ALLERGIES:  has No Known Allergies. ? ?MEDICATIONS:  ?Current Outpatient Medications  ?Medication Sig Dispense Refill  ? venlafaxine XR (EFFEXOR-XR) 37.5 MG 24 hr capsule Take 1 capsule (37.5 mg total) by mouth daily with breakfast. 30 capsule 6  ? ascorbic acid (VITAMIN C) 1000 MG tablet Take by mouth.    ? Biotin 1 MG CAPS Take 1 capsule by mouth daily. 30 capsule   ? buPROPion (WELLBUTRIN SR) 150 MG 12 hr tablet Take 150 mg by mouth daily.    ?  buPROPion (WELLBUTRIN) 75 MG tablet Take 75 mg by mouth daily.    ? Cholecalciferol (DIALYVITE VITAMIN D 5000) 125 MCG (5000 UT) capsule Take 5,000 Units by mouth daily.    ? cloNIDine (CATAPRES) 0.1 MG tablet Take 0.1 mg by mouth at bedtime as needed.    ? ferrous sulfate 325 (65 FE) MG  tablet Take by mouth.    ? ibuprofen (ADVIL) 800 MG tablet Take 800 mg by mouth 2 (two) times daily.    ? lidocaine-prilocaine (EMLA) cream Apply 1 application topically as needed. 30 g 0  ? Magnesium 500 MG CAPS Take 1 capsule (500 mg total) by mouth daily.  0  ? metoprolol tartrate (LOPRESSOR) 25 MG tablet Take 25 mg by mouth daily.    ? NON FORMULARY Take 900 mg by mouth daily. Hyaluronic Acid Complex    ? ondansetron (ZOFRAN) 8 MG tablet Take 1 tablet (8 mg total) by mouth every 8 (eight) hours as needed for nausea. 30 tablet 3  ? OVER THE COUNTER MEDICATION Take 4 capsules by mouth daily. Laminine supplement- 620 mg    ? Sodium Hyaluronate, oral, (HYALURONIC ACID) 100 MG CAPS Take by mouth.    ? zolpidem (AMBIEN) 10 MG tablet Take 10 mg by mouth at bedtime.    ? ?No current facility-administered medications for this visit.  ? ? ?PHYSICAL EXAMINATION: ?ECOG PERFORMANCE STATUS: 1 - Symptomatic but completely ambulatory ? ?Vitals:  ? 10/07/21 0918  ?BP: 128/79  ?Pulse: 83  ?Resp: 18  ?Temp: 97.8 ?F (36.6 ?C)  ?SpO2: 100%  ? ?Filed Weights  ? 10/07/21 0918  ?Weight: 154 lb 12.8 oz (70.2 kg)  ? ? ?LABORATORY DATA:  ?I have reviewed the data as listed ? ?  Latest Ref Rng & Units 08/26/2021  ?  9:07 AM 07/18/2021  ?  9:26 AM 05/30/2021  ?  7:56 AM  ?CMP  ?Glucose 70 - 99 mg/dL 105   95   95    ?BUN 6 - 20 mg/dL $Remove'20   18   13    'hCSUegU$ ?Creatinine 0.44 - 1.00 mg/dL 0.75   0.68   0.71    ?Sodium 135 - 145 mmol/L 139   138   139    ?Potassium 3.5 - 5.1 mmol/L 3.6   3.9   4.0    ?Chloride 98 - 111 mmol/L 103   103   103    ?CO2 22 - 32 mmol/L $RemoveB'28   28   26    'bQNxBPWR$ ?Calcium 8.9 - 10.3 mg/dL 9.4   9.5   9.5    ?Total Protein 6.5 - 8.1 g/dL 6.7   7.0   7.5    ?Total Bilirubin 0.3 - 1.2 mg/dL 0.3   0.4   0.2    ?Alkaline Phos 38 - 126 U/L 58   54   56    ?AST 15 - 41 U/L $Remo'19   17   14    'XMZVg$ ?ALT 0 - 44 U/L $Remo'16   13   12    'jAOEH$ ? ? ?Lab Results  ?Component Value Date  ? WBC 3.9 (L) 10/07/2021  ? HGB 11.3 (L) 10/07/2021  ? HCT 33.8 (L) 10/07/2021  ? MCV  87.1 10/07/2021  ? PLT 169 10/07/2021  ? NEUTROABS 2.2 10/07/2021  ? ? ?ASSESSMENT & PLAN:  ?Malignant neoplasm of lower-outer quadrant of left breast of female, estrogen receptor positive ([place]) ?10/01/2020:Screening mammogram showed a left breast mass and calcifications. Diagnostic mammogram and US  showed a 1.7cm and 0.5cm mass at the 5 o'clock position in the left breast, with one 0.4cm abnormal right axillary lymph node. 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 ?  ?Treatment Plan: ?1. Neoadjuvant chemotherapy with [place] Perjeta ?6 cycles followed by Herceptin Perjeta maintenance for 1 year ?2. 03/12/2021: Pathologic complete response, 0/6 lymph nodes negative ?3. Followed by adjuvant radiation therapy completed 05/30/2021 ?4.  Followed by antiestrogen therapy although ER is weak ?[facility] nausea study ?Breast MRI: 10/17/20: 2.4 cm left breast mass, intramammary lymph node ?------------------------------------------------------------------------------------------------------------------------- ?Current treatment: Completed 6 cycles of TCHP, currently on Herceptin and Perjeta maintenance (will complete June 2023) ?  ?Toxicities: No adverse effects with Herceptin and Perjeta. ?Severe hot flashes: Menopausal symptoms.  We will check [place] and estradiol today.  I sent a prescription for Effexor today.  If her hot flashes improve from severe to mild to moderate and we can initiate antiestrogen therapy. ? ?Antiestrogen therapy decision will be based upon [facility] and estradiol levels. ?She has an appointment to see Mendel Ryder for survivorship care plan visit with her last treatment.  At that time antiestrogen therapy can be initiated. ? ?Chemo-induced peripheral neuropathy: Recommended that she participate in neuropathy clinical trial with PEA.  We will provide her with information regarding this. ? ?Return to clinic in 3 weeks for her last cycle of Herceptin and  Perjeta. ? ? ? ? ?Orders Placed This Encounter  ?Procedures  ? FSH-Follicle stimulating hormone  ?  Standing Status:   Future  ?  Standing Expiration Date:   10/07/2022  ? Estradiol, Sensitive  ?  Standing

## 2021-09-30 ENCOUNTER — Encounter: Payer: Self-pay | Admitting: *Deleted

## 2021-10-01 ENCOUNTER — Encounter (HOSPITAL_COMMUNITY): Payer: Self-pay

## 2021-10-03 ENCOUNTER — Other Ambulatory Visit: Payer: 59

## 2021-10-03 ENCOUNTER — Ambulatory Visit: Payer: 59 | Admitting: Hematology and Oncology

## 2021-10-03 ENCOUNTER — Ambulatory Visit: Payer: 59

## 2021-10-07 ENCOUNTER — Inpatient Hospital Stay: Payer: Commercial Managed Care - HMO

## 2021-10-07 ENCOUNTER — Other Ambulatory Visit: Payer: Self-pay

## 2021-10-07 ENCOUNTER — Encounter: Payer: Self-pay | Admitting: *Deleted

## 2021-10-07 ENCOUNTER — Inpatient Hospital Stay (HOSPITAL_BASED_OUTPATIENT_CLINIC_OR_DEPARTMENT_OTHER): Payer: Commercial Managed Care - HMO | Admitting: Hematology and Oncology

## 2021-10-07 DIAGNOSIS — C50512 Malignant neoplasm of lower-outer quadrant of left female breast: Secondary | ICD-10-CM | POA: Diagnosis not present

## 2021-10-07 DIAGNOSIS — Z170 Estrogen receptor positive status [ER+]: Secondary | ICD-10-CM | POA: Diagnosis not present

## 2021-10-07 DIAGNOSIS — M7989 Other specified soft tissue disorders: Secondary | ICD-10-CM

## 2021-10-07 DIAGNOSIS — R2232 Localized swelling, mass and lump, left upper limb: Secondary | ICD-10-CM

## 2021-10-07 DIAGNOSIS — Z95828 Presence of other vascular implants and grafts: Secondary | ICD-10-CM

## 2021-10-07 DIAGNOSIS — C50912 Malignant neoplasm of unspecified site of left female breast: Secondary | ICD-10-CM

## 2021-10-07 LAB — CBC WITH DIFFERENTIAL (CANCER CENTER ONLY)
Abs Immature Granulocytes: 0.01 10*3/uL (ref 0.00–0.07)
Basophils Absolute: 0 10*3/uL (ref 0.0–0.1)
Basophils Relative: 0 %
Eosinophils Absolute: 0.1 10*3/uL (ref 0.0–0.5)
Eosinophils Relative: 3 %
HCT: 33.8 % — ABNORMAL LOW (ref 36.0–46.0)
Hemoglobin: 11.3 g/dL — ABNORMAL LOW (ref 12.0–15.0)
Immature Granulocytes: 0 %
Lymphocytes Relative: 29 %
Lymphs Abs: 1.1 10*3/uL (ref 0.7–4.0)
MCH: 29.1 pg (ref 26.0–34.0)
MCHC: 33.4 g/dL (ref 30.0–36.0)
MCV: 87.1 fL (ref 80.0–100.0)
Monocytes Absolute: 0.5 10*3/uL (ref 0.1–1.0)
Monocytes Relative: 12 %
Neutro Abs: 2.2 10*3/uL (ref 1.7–7.7)
Neutrophils Relative %: 56 %
Platelet Count: 169 10*3/uL (ref 150–400)
RBC: 3.88 MIL/uL (ref 3.87–5.11)
RDW: 14.1 % (ref 11.5–15.5)
WBC Count: 3.9 10*3/uL — ABNORMAL LOW (ref 4.0–10.5)
nRBC: 0 % (ref 0.0–0.2)

## 2021-10-07 LAB — CMP (CANCER CENTER ONLY)
ALT: 21 U/L (ref 0–44)
AST: 22 U/L (ref 15–41)
Albumin: 4.2 g/dL (ref 3.5–5.0)
Alkaline Phosphatase: 57 U/L (ref 38–126)
Anion gap: 7 (ref 5–15)
BUN: 20 mg/dL (ref 6–20)
CO2: 31 mmol/L (ref 22–32)
Calcium: 9.3 mg/dL (ref 8.9–10.3)
Chloride: 104 mmol/L (ref 98–111)
Creatinine: 0.77 mg/dL (ref 0.44–1.00)
GFR, Estimated: >60 mL/min (ref >60)
Glucose, Bld: 105 mg/dL — ABNORMAL HIGH (ref 70–99)
Potassium: 3.8 mmol/L (ref 3.5–5.1)
Sodium: 142 mmol/L (ref 135–145)
Total Bilirubin: 0.2 mg/dL — ABNORMAL LOW (ref 0.3–1.2)
Total Protein: 6.7 g/dL (ref 6.5–8.1)

## 2021-10-07 MED ORDER — SODIUM CHLORIDE 0.9% FLUSH
10.0000 mL | INTRAVENOUS | Status: DC | PRN
  Administered 2021-10-07: 10 mL

## 2021-10-07 MED ORDER — VENLAFAXINE HCL ER 37.5 MG PO CP24: 37.5000 mg | ORAL_CAPSULE | Freq: Every day | ORAL | 6 refills | Status: DC

## 2021-10-07 MED ORDER — TRASTUZUMAB-ANNS CHEMO 150 MG IV SOLR
6.0000 mg/kg | Freq: Once | INTRAVENOUS | Status: AC
  Administered 2021-10-07: 399 mg via INTRAVENOUS
  Filled 2021-10-07: qty 19

## 2021-10-07 MED ORDER — LORATADINE 10 MG PO TABS
10.0000 mg | ORAL_TABLET | Freq: Once | ORAL | Status: AC
  Administered 2021-10-07: 10 mg via ORAL
  Filled 2021-10-07: qty 1

## 2021-10-07 MED ORDER — SODIUM CHLORIDE 0.9% FLUSH
10.0000 mL | Freq: Once | INTRAVENOUS | Status: AC
  Administered 2021-10-07: 10 mL

## 2021-10-07 MED ORDER — SODIUM CHLORIDE 0.9 % IV SOLN
420.0000 mg | Freq: Once | INTRAVENOUS | Status: AC
  Administered 2021-10-07: 420 mg via INTRAVENOUS
  Filled 2021-10-07: qty 14

## 2021-10-07 MED ORDER — ACETAMINOPHEN 325 MG PO TABS
650.0000 mg | ORAL_TABLET | Freq: Once | ORAL | Status: AC
  Administered 2021-10-07: 650 mg via ORAL
  Filled 2021-10-07: qty 2

## 2021-10-07 MED ORDER — SODIUM CHLORIDE 0.9 % IV SOLN: Freq: Once | INTRAVENOUS | Status: AC

## 2021-10-07 MED ORDER — HEPARIN SOD (PORK) LOCK FLUSH 100 UNIT/ML IV SOLN
500.0000 [IU] | Freq: Once | INTRAVENOUS | Status: AC | PRN
  Administered 2021-10-07: 500 [IU]

## 2021-10-07 NOTE — Assessment & Plan Note (Addendum)
10/01/2020:Screening mammogram showed a left breast mass and calcifications. Diagnostic mammogram and US showed a 1.7cm and 0.5cm mass at the 5 o'clock position in the left breast, with one 0.4cm abnormal right axillary lymph node. 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 ?? ?Treatment Plan: ?1. Neoadjuvant chemotherapy with [place] Perjeta ?6 cycles followed by Herceptin Perjeta maintenance for 1 year ?2.?03/12/2021: Pathologic complete response, 0/6 lymph nodes negative ?3. Followed by adjuvant radiation therapy?completed 05/30/2021 ?4.??Followed by antiestrogen therapy although ER is weak ?[facility] nausea study ?Breast MRI: 10/17/20:?2.4 cm left breast mass, intramammary lymph node ?------------------------------------------------------------------------------------------------------------------------- ?Current treatment:?Completed 6 cycles of?TCHP, currently on Herceptin and Perjeta maintenance?(will complete June 2023) ?? ?Toxicities: No adverse effects with Herceptin and Perjeta. ?Severe hot flashes: Menopausal symptoms.  We will check [place] and estradiol today.  I sent a prescription for Effexor today.  If her hot flashes improve from severe to mild to moderate and we can initiate antiestrogen therapy. ? ?Antiestrogen therapy decision will be based upon [facility] and estradiol levels. ?She has an appointment to see Mendel Ryder for survivorship care plan visit with her last treatment.  At that time antiestrogen therapy can be initiated. ? ?Chemo-induced peripheral neuropathy: Recommended that she participate in neuropathy clinical trial with PEA.  We will provide her with information regarding this. ? ?Return to clinic in 3 weeks for her last cycle of Herceptin and Perjeta. ? ? ?

## 2021-10-07 NOTE — Research (Signed)
Trial:  ACCRU-[id number] - TREATMENT OF ESTABLISHED CHEMOTHERAPY-INDUCED NEUROPATHY WITH N-PALMITOYLETHANOLAMIDE, A CANNABIMIMETIC NUTRACEUTICAL: A RANDOMIZED DOUBLE-BLIND PHASE II PILOT TRIAL  ?Patient Tammie [name] was identified by Dr. Lindi Adie as a potential candidate for the above listed study.  This Clinical Research Nurse met with Tammie [name], [id number], on 10/07/21 in a manner and location that ensures patient privacy to discuss participation in the above listed research study.  Patient is Unaccompanied.  A copy of the informed consent document with embedded HIPAA language was provided to the patient.  Patient reads, speaks, and understands [place].   ?Patient was provided with the business card of this Nurse and encouraged to contact the research team with any questions.  Approximately 10 minutes were spent with the patient reviewing the informed consent documents.  Patient was provided the option of taking informed consent documents home to review and was encouraged to review at their convenience with their support network, including other care providers. Patient took the consent documents home to review.  ? ?STUDY INTRODUCTION: Patient was asked how much of a problem has numbness, tingling or pain in your fingers and/or toes been in the past week. Patient reported a score of 7.  She states neuropathy started sometime during her chemotherapy treatment which ended on 02/27/21.  It started in her right small toe and lateral foot. She now has neuropathy in her fingers on both hands. Patient verbally confirms that she can swallow oral medications. Patient Denies concurrent use of a cannabis product [facility] and/or CBD).  Patient was instructed she would have to avoid use of cannabis products if she participates in the study. Patient Denies current or previous use of PEA. Patient Denies current use (or planning to use) opioids, duloxetine, gabapentin or pregabalin. Informed patient the study will require a  pregnancy test prior to starting if she is interested.  ?PLAN: Patient expressed interest in participating in this study.  She agreed to read the consent form and for research nurse to call her tomorrow afternoon to follow up. Patient understands if she enrolls on this study she will first have to return to the clinic to sign consent form with nurse, provide urine sample for pregnancy test and complete baseline questionnaires.  Thanked patient for her time and plan to speak with her again tomorrow as agreed.  ?Foye Spurling, BSN, RN, CCRP ?Clinical Research Nurse II ?10/07/2021 11:07 AM ? ? ? ? ? ? ? ? ? ? ?  ? ? ? ? ? ?  ? ? ? ?

## 2021-10-07 NOTE — Progress Notes (Signed)
Pt declined to stay 30 minute post perjeta observation. Stable upon discharge ?

## 2021-10-07 NOTE — Patient Instructions (Signed)
[facility]  Discharge Instructions: ?Thank you for choosing [facility] to provide your oncology and hematology care.  ? ?If you have a lab appointment with the [facility], please go directly to the [facility] and check in at the registration area. ?  ?Wear comfortable clothing and clothing appropriate for easy access to any Portacath or PICC line.  ? ?We strive to give you quality time with your provider. You may need to reschedule your appointment if you arrive late (15 or more minutes).  Arriving late affects you and other patients whose appointments are after yours.  Also, if you miss three or more appointments without notifying the office, you may be dismissed from the clinic at the provider?s discretion.    ?  ?For prescription refill requests, have your pharmacy contact our office and allow 72 hours for refills to be completed.   ? ?Today you received the following chemotherapy and/or immunotherapy agents herceptin, perjeta    ?  ?To help prevent nausea and vomiting after your treatment, we encourage you to take your nausea medication as directed. ? ?BELOW ARE SYMPTOMS THAT SHOULD BE REPORTED IMMEDIATELY: ?*FEVER GREATER THAN 100.4 F (38 ?C) OR HIGHER ?*CHILLS OR SWEATING ?*NAUSEA AND VOMITING THAT IS NOT CONTROLLED WITH YOUR NAUSEA MEDICATION ?*UNUSUAL SHORTNESS OF BREATH ?*UNUSUAL BRUISING OR BLEEDING ?*URINARY PROBLEMS (pain or burning when urinating, or frequent urination) ?*BOWEL PROBLEMS (unusual diarrhea, constipation, pain near the anus) ?TENDERNESS IN MOUTH AND THROAT WITH OR WITHOUT PRESENCE OF ULCERS (sore throat, sores in mouth, or a toothache) ?UNUSUAL RASH, SWELLING OR PAIN  ?UNUSUAL VAGINAL DISCHARGE OR ITCHING  ? ?Items with * indicate a potential emergency and should be followed up as soon as possible or go to the Emergency Department if any problems should occur. ? ?Please show the CHEMOTHERAPY ALERT CARD or IMMUNOTHERAPY ALERT CARD at  check-in to the Emergency Department and triage nurse. ? ?Should you have questions after your visit or need to cancel or reschedule your appointment, please contact [facility]  Dept: [phone]  and follow the prompts.  Office hours are 8:00 a.m. to 4:30 p.m. Monday - Friday. Please note that voicemails left after 4:00 p.m. may not be returned until the following business day.  We are closed weekends and major holidays. You have access to a nurse at all times for urgent questions. Please call the main number to the clinic Dept: [phone] and follow the prompts. ? ? ?For any non-urgent questions, you may also contact your provider using MyChart. We now offer e-Visits for anyone 13 and older to request care online for non-urgent symptoms. For details visit [email or web address]. ?  ?Also download the MyChart app! Go to the app store, search "MyChart", open the app, select [facility], and log in with your MyChart username and password. ? ?Due to Covid, a mask is required upon entering the hospital/clinic. If you do not have a mask, one will be given to you upon arrival. For doctor visits, patients may have 1 support [name] aged 44 or older with them. For treatment visits, patients cannot have anyone with them due to current Covid guidelines and our immunocompromised population.  ? ?

## 2021-10-08 ENCOUNTER — Telehealth: Payer: Self-pay | Admitting: *Deleted

## 2021-10-08 LAB — FOLLICLE STIMULATING HORMONE: FSH: 46.1 m[IU]/mL

## 2021-10-08 NOTE — Telephone Encounter (Signed)
ACCRU-[place]-2102: Patient reports she has read the consent form and wants to participate in the study.  She asks if she will be able to find out if she took placebo or not after the study.  Informed patient that we can find out after she completes the 8 weeks of study drug and we have entered all her data.  Patient denies any other questions.  She reports her last menstrual cycle was 12 months ago.  Her recent [facility] level was in the post menopausal range.  Informed patient she may still need to do a pregnancy test but I will check with the study to find out.  Patient states she is willing to have the pregnancy test if needed.  ?Patient states she can come in to consent and start on the study next Tuesday 10/14/21 at 1:30 pm.  Asked patient to please call if any questions before that visit or if she needs to reschedule for any reason. Patient verbalized understanding. Thanked patient for her time.  ?Found out that pharmacist that is on this study will not be here next Tuesday.  Called patient back to reschedule this visit. Patient states she can come in tomorrow afternoon at 2 pm.  Thanked patient for her flexibility.  ?Foye Spurling, BSN, RN, CCRP ?Clinical Research Nurse II ?10/08/2021 3:20 PM ? ?

## 2021-10-09 ENCOUNTER — Encounter: Payer: Self-pay | Admitting: Hematology and Oncology

## 2021-10-09 ENCOUNTER — Inpatient Hospital Stay: Payer: Commercial Managed Care - HMO | Admitting: *Deleted

## 2021-10-09 ENCOUNTER — Inpatient Hospital Stay: Payer: Commercial Managed Care - HMO

## 2021-10-09 DIAGNOSIS — C50512 Malignant neoplasm of lower-outer quadrant of left female breast: Secondary | ICD-10-CM | POA: Diagnosis not present

## 2021-10-09 DIAGNOSIS — Z170 Estrogen receptor positive status [ER+]: Secondary | ICD-10-CM

## 2021-10-09 LAB — PREGNANCY, URINE: Preg Test, Ur: NEGATIVE

## 2021-10-09 NOTE — Research (Signed)
Trial Name:  ACCRU-[id number] - TREATMENT OF ESTABLISHED CHEMOTHERAPY-INDUCED NEUROPATHY WITH N-PALMITOYLETHANOLAMIDE, A CANNABIMIMETIC NUTRACEUTICAL: A RANDOMIZED DOUBLE-BLIND PHASE II PILOT TRIAL  ? ?CONSENT: Patient Tammie [name] was identified by Dr. Lindi Adie as a potential candidate for the above listed study.  This Clinical Research Nurse met with Tammie [name], Tammie [name] on 10/09/21 in a manner and location that ensures patient privacy to discuss participation in the above listed research study.  Patient is Unaccompanied.  Patient was previously provided with informed consent documents.  Patient confirmed they have read the informed consent documents. ? ?As outlined in the informed consent form, this Nurse and Tammie [name] discussed the purpose of the research study, the investigational nature of the study, study procedures and requirements for study participation, potential risks and benefits of study participation, as well as alternatives to participation.  This study is blinded or double-blinded. The patient understands participation is voluntary and they may withdraw from study participation at any time.  Each study arm was reviewed, and randomization discussed.  Potential side effects were reviewed with patient as outlined in the consent form, and patient made aware there may be side effects not yet known. The chance of receiving placebo was discussed. Patient understands enrollment is pending full eligibility review.  ? ?Confidentiality and how the patient's information will be used as part of study participation were discussed.  Patient was informed there is not reimbursement provided for their time and effort spent on trial participation.  The patient is encouraged to discuss research study participation with their insurance provider to determine what costs they may incur as part of study participation, including research related injury.   ? ?All questions were answered to patient's  satisfaction.  The informed consent with embedded HIPAA language was reviewed page by page.  The patient's mental and emotional status is appropriate to provide informed consent, and the patient verbalizes an understanding of study participation.  Patient has agreed to participate in the above listed research study and has voluntarily signed the informed consent version IRB approved 02 Aug 2020, Revised 16 Sep 2020  on 10/09/21 at 2:25PM.  The patient was provided with a copy of the signed informed consent form with embedded HIPAA language for their reference.  No study specific procedures were obtained prior to the signing of the informed consent document.  Approximately 25 minutes were spent with the patient reviewing the informed consent documents.  Patient was not requested to complete a Release of Information form.  ? ?LABS:  A pregnancy test is required for this patient and completed after patient signed consent. Result was negative.  ? ?LIFE EXPECTANCY: The patient's provider has confirmed that their life expectancy is greater than 6 months. ? ?This Nurse has reviewed this patient's inclusion and exclusion criteria and confirmed Tammie [name] is eligible for study participation.  Patient will continue with enrollment. ? ?Menopausal status (women only): Tammie [name] is considered pre-menopausal at this time and patient agrees to use contraception or abstinence while taking study drug.  ? ?Eligibility confirmed by treating investigator, who also agrees that patient should proceed with enrollment.  ? ?REGISTRATION: Patient was registered to the above listed study and assigned study PID #[id number]. Randomization is required for this study. Randomization information to follow.  ? ?RANDOMIZATION: Patient is randomized to blinded arm of the study.  Stratification criteria were confirmed by this clinical research Nurse and clinical research Nurse  Tammie [name] verified the stratification factors used for  randomization (female and taxane +  cardoplatin). As part of the assigned treatment arm, patient will receive study agent 2 time(s) daily. MD will be notified of patient assignment; treatment will begin tomorrow morning 10/10/21.  Per study protocol, patient will not be notified of their treatment assignment. Patient is successfully enrolled in the above study. ? ?DRUG ORDERING: Pharmacy contact Tammie [name] was notified of enrollment and need for study drug ordering by unblinded pharmacy staff.  ?  ?BASELINE VISIT: Patient was provided baseline questionnaire booklet and completed questionnaires prior to any other visit procedures. Patient was then provided with weekly questionnaire booklets, prelabeled with the week number and cycle dates, in chronological order. Patient was provided with a self-addressed stamped envelope for returning completed questionnaires weekly. Patient was instructed to complete the weekly questionnaire at the end of each week and return completed questionnaires in the provided self-addressed stamped envelope(s). Patient does verbalize understanding.  ? ?INVESTIGATIONAL PRODUCT: Investigational product was prepared and labeled by Tammie [name], Pharmacist. Patient was provided 4 bottle(s) of investigational product PEA/Placebo. Patient was instructed to take one tablet 2 times per day for eight (8) weeks. Patient was instructed to store study drug at room temperature with the lid tight closed. Patient was instructed to swallow capsules whole (do not crush or open capsules) and with food. Patient does verbalize understanding of how to take investigational product.  ? ?FIRST FOLLOW-UP PHONE CALL: Patient agrees to their first weekly follow-up phone call on Friday 10/17/21 in the afternoon. ? ?Tammie [name], BSN, RN, CCRP ?Clinical Research Nurse II ? ? ? ? ? ? ?

## 2021-10-10 ENCOUNTER — Ambulatory Visit: Payer: Self-pay

## 2021-10-10 ENCOUNTER — Other Ambulatory Visit: Payer: Self-pay

## 2021-10-10 ENCOUNTER — Encounter: Payer: Self-pay | Admitting: Hematology and Oncology

## 2021-10-10 ENCOUNTER — Ambulatory Visit: Payer: Self-pay | Admitting: Hematology and Oncology

## 2021-10-10 MED ORDER — INV-PALMITOYLETHANOLAMIDE/PLACEBO 400 MG CAPS ACCRU-~~LOC~~-2102: ORAL_CAPSULE | ORAL | 0 refills | Status: DC

## 2021-10-10 NOTE — Research (Signed)
ACCRU-[place]-2102 - TREATMENT OF ESTABLISHED CHEMOTHERAPY-INDUCED NEUROPATHY WITH N-PALMITOYLETHANOLAMIDE, A CANNABIMIMETIC NUTRACEUTICAL: A RANDOMIZED DOUBLE-BLIND PHASE II PILOT TRIAL ? ?SECOND ELIGIBILITY CHECK- LATE ENTRY ? ?This Nurse has reviewed this patient's inclusion and exclusion criteria on 10/09/2021 and confirmed Tammie [name] is eligible for study participation before enrollment took place on 10/09/2021.  Patient may continue with enrollment. ? ?Wells Guiles 'Learta Codding' Airiana Elman, RN, BSN ?Clinical Research Nurse I ?10/10/21 ?12:57 PM ? ?

## 2021-10-15 ENCOUNTER — Encounter: Payer: Self-pay | Admitting: Hematology and Oncology

## 2021-10-15 LAB — ESTRADIOL, ULTRA SENS: Estradiol, Sensitive: <2.5 pg/mL

## 2021-10-17 ENCOUNTER — Encounter: Payer: Self-pay | Admitting: *Deleted

## 2021-10-17 DIAGNOSIS — C50512 Malignant neoplasm of lower-outer quadrant of left female breast: Secondary | ICD-10-CM

## 2021-10-17 NOTE — Research (Signed)
ACCRU-[place]-2102 - TREATMENT OF ESTABLISHED CHEMOTHERAPY-INDUCED NEUROPATHY WITH N-PALMITOYLETHANOLAMIDE, A CANNABIMIMETIC NUTRACEUTICAL: A RANDOMIZED DOUBLE-BLIND PHASE II PILOT TRIAL ?  ? ?WEEK 1 PHONE CALL: Patient was contacted via phone today to complete study week 1 phone call. Identity was confirmed using two patient identifiers.  ?  ?QUESTIONNAIRE: Patient has completed their weekly questionnaire today and has not mailed them yet but says she  will mail it out today using the provided envelope.  ? ?INVESTIGATIONAL PRODUCT: Patient reports taking 14 capsules since 10/10/21. Patient is always compliant with study medication. Patient is re-educated on the importance of taking study medication as directed twice daily once in the morning with food and once in the evening with food. patient does verbalize understanding.  ?Patient denies use of other cannbinoid products such as CBD and/or THC. Patient denies any new medications.  ? ?ADVERSE EFFECTS: Patient denies any nausea, vomiting diarrhea or any other adverse events since starting    study medication.   ? ?The patient was thanked for their time and continued voluntary participation in this study. Patient has been provided direct contact information and is encouraged to contact this Nurse for any needs or questions. ? ?Foye Spurling, BSN, RN, CCRP ?Clinical Research Nurse II ?10/17/2021 3:19 PM ?  ? ? ? ? ? ?

## 2021-10-19 ENCOUNTER — Encounter: Payer: Self-pay | Admitting: Hematology and Oncology

## 2021-10-22 ENCOUNTER — Encounter: Payer: Self-pay | Admitting: Hematology and Oncology

## 2021-10-24 ENCOUNTER — Encounter: Payer: Self-pay | Admitting: *Deleted

## 2021-10-24 ENCOUNTER — Ambulatory Visit: Payer: 59

## 2021-10-24 ENCOUNTER — Telehealth: Payer: Self-pay | Admitting: *Deleted

## 2021-10-24 DIAGNOSIS — C50512 Malignant neoplasm of lower-outer quadrant of left female breast: Secondary | ICD-10-CM

## 2021-10-24 NOTE — Research (Signed)
ACCRU-[place]-2102 - TREATMENT OF ESTABLISHED CHEMOTHERAPY-INDUCED NEUROPATHY WITH N-PALMITOYLETHANOLAMIDE, A CANNABIMIMETIC NUTRACEUTICAL: A RANDOMIZED DOUBLE-BLIND PHASE II PILOT TRIAL ?  ? ?WEEK 2 PHONE CALL: Patient was contacted via phone today to complete study week 2 phone call. Identity was confirmed using two patient identifiers.  ?  ?QUESTIONNAIRE: Patient has completed their weekly questionnaire yesterday and states she will bring with her to the clinic when she comes in for her next appointment this Tuesday.  ? ?INVESTIGATIONAL PRODUCT: Patient reports taking 14 capsules since 10/17/21. Patient is always compliant with study medication. Patient is re-educated on the importance of taking study medication as directed twice daily once in the morning with food and once in the evening with food. Patient does verbalize understanding.  ?Patient denies use of other cannbinoid products such as CBD and/or THC. Patient denies any new medications.  ? ?ADVERSE EFFECTS: Patient reports one episode of nausea and vomiting last Sunday. She thinks it was something she ate the night before. She took zofran and did not have any further nausea or vomiting. She denies any diarrhea or any other adverse events this past week.   ? ?Study/Protocol: ACCRU [place]-2102 ?Week 2 10/17/21-10/23/21 ?  ?Event Grade Onset Date Resolved Date Drug Name Attribution Treatment Comments  ?Nausea Grade 1 10/19/21 10/19/21 PEA/Placebo Possible Zofran    ?Vomiting Grade 1 10/19/21 10/19/21 PEA/Placebo Possible Zofran   ?  ? ?The patient was thanked for their time and continued voluntary participation in this study. Patient has been provided direct contact information and is encouraged to contact this Nurse for any needs or questions. ? ?Foye Spurling, BSN, RN, CCRP ?Clinical Research Nurse II ?10/24/2021 1:58 PM ?  ? ? ? ? ? ?

## 2021-10-28 ENCOUNTER — Encounter: Payer: Self-pay | Admitting: Hematology and Oncology

## 2021-10-28 ENCOUNTER — Inpatient Hospital Stay: Payer: Commercial Managed Care - HMO | Attending: Hematology and Oncology

## 2021-10-28 ENCOUNTER — Encounter: Payer: Self-pay | Admitting: Adult Health

## 2021-10-28 ENCOUNTER — Encounter: Payer: Self-pay | Admitting: *Deleted

## 2021-10-28 ENCOUNTER — Other Ambulatory Visit: Payer: Self-pay

## 2021-10-28 ENCOUNTER — Ambulatory Visit: Payer: Self-pay | Admitting: Surgery

## 2021-10-28 ENCOUNTER — Inpatient Hospital Stay (HOSPITAL_BASED_OUTPATIENT_CLINIC_OR_DEPARTMENT_OTHER): Payer: Commercial Managed Care - HMO | Admitting: Adult Health

## 2021-10-28 VITALS — BP 126/50 | HR 85 | Temp 97.8°F | Resp 18 | Wt 153.1 lb

## 2021-10-28 VITALS — BP 128/69 | HR 86 | Temp 97.8°F | Resp 16 | Ht 63.0 in | Wt 154.2 lb

## 2021-10-28 DIAGNOSIS — C50512 Malignant neoplasm of lower-outer quadrant of left female breast: Secondary | ICD-10-CM | POA: Insufficient documentation

## 2021-10-28 DIAGNOSIS — Z170 Estrogen receptor positive status [ER+]: Secondary | ICD-10-CM

## 2021-10-28 DIAGNOSIS — Z5111 Encounter for antineoplastic chemotherapy: Secondary | ICD-10-CM | POA: Insufficient documentation

## 2021-10-28 DIAGNOSIS — E2839 Other primary ovarian failure: Secondary | ICD-10-CM

## 2021-10-28 DIAGNOSIS — Z79899 Other long term (current) drug therapy: Secondary | ICD-10-CM | POA: Insufficient documentation

## 2021-10-28 MED ORDER — LIDOCAINE HCL 2 % IJ SOLN: 20.0000 mL | Freq: Once | INTRAMUSCULAR | Status: DC

## 2021-10-28 MED ORDER — ACETAMINOPHEN 325 MG PO TABS
650.0000 mg | ORAL_TABLET | Freq: Once | ORAL | Status: AC
  Administered 2021-10-28: 650 mg via ORAL
  Filled 2021-10-28: qty 2

## 2021-10-28 MED ORDER — LORATADINE 10 MG PO TABS
10.0000 mg | ORAL_TABLET | Freq: Once | ORAL | Status: AC
  Administered 2021-10-28: 10 mg via ORAL
  Filled 2021-10-28: qty 1

## 2021-10-28 MED ORDER — TRASTUZUMAB-ANNS CHEMO 150 MG IV SOLR
6.0000 mg/kg | Freq: Once | INTRAVENOUS | Status: AC
  Administered 2021-10-28: 399 mg via INTRAVENOUS
  Filled 2021-10-28: qty 19

## 2021-10-28 MED ORDER — SODIUM CHLORIDE 0.9 % IV SOLN: Freq: Once | INTRAVENOUS | Status: AC

## 2021-10-28 MED ORDER — HEPARIN SOD (PORK) LOCK FLUSH 100 UNIT/ML IV SOLN
500.0000 [IU] | Freq: Once | INTRAVENOUS | Status: AC | PRN
  Administered 2021-10-28: 500 [IU]

## 2021-10-28 MED ORDER — SODIUM CHLORIDE 0.9 % IV SOLN
420.0000 mg | Freq: Once | INTRAVENOUS | Status: AC
  Administered 2021-10-28: 420 mg via INTRAVENOUS
  Filled 2021-10-28: qty 14

## 2021-10-28 MED ORDER — SODIUM CHLORIDE 0.9% FLUSH
10.0000 mL | INTRAVENOUS | Status: DC | PRN
  Administered 2021-10-28: 10 mL

## 2021-10-28 MED ORDER — ANASTROZOLE 1 MG PO TABS: 1.0000 mg | ORAL_TABLET | Freq: Every day | ORAL | 11 refills | Status: DC

## 2021-10-28 NOTE — Progress Notes (Signed)
Pt declined to stay for 30 minute observation period post Pertuzumab. VSS. No complaints at time of discharge. ?

## 2021-10-28 NOTE — Progress Notes (Signed)
Collected week 2 PROs from patient in infusion room today. Checked for completeness and accuracy. Patient denies any new problems today. Plan to call patient again on Friday for week 3 assessment and she verbalized understanding. Thanked patient for completing the questionnaires and bringing them in with her today.  ?Foye Spurling, BSN, RN, CCRP ?Clinical Research Nurse II ?10/28/2021 10:36 AM ? ?

## 2021-10-28 NOTE — Progress Notes (Signed)
SURVIVORSHIP VISIT: ? ? ?BRIEF ONCOLOGIC HISTORY:  ?Oncology History  ?Malignant neoplasm of lower-outer quadrant of left breast of female, estrogen receptor positive ([place])  ?10/01/2020 Initial Diagnosis  ? Screening mammogram showed a left breast mass and calcifications. Diagnostic mammogram and US showed a 1.7cm and 0.5cm mass at the 5 o'clock position in the left breast, with one 0.4cm abnormal right axillary lymph node. 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 ?  ?10/09/2020 Cancer Staging  ? Staging form: Breast, AJCC 8th Edition ?- Clinical stage from 10/09/2020: Stage IB (cT2, cN1, cM0, G3, ER+, PR+, HER2+) - Signed by Nicholas Lose, MD on 10/09/2020 ?Stage prefix: Initial diagnosis ?Histologic grading system: 3 grade system ?Percentage of positive estrogen receptors (%): 5 ?Percentage of positive progesterone receptors (%): 1 ?Ki-67 (%): 20 ? ?  ?10/18/2020 - 02/27/2021 Chemotherapy  ? Patient is on Treatment Plan : BREAST  Docetaxel + Carboplatin + Trastuzumab + Pertuzumab  (TCHP) q21d   ? ?   ?10/23/2020 Genetic Testing  ? Negative genetic testing:  No pathogenic variants detected on the [employer] CancerNext-Expanded + RNAinsight panel. The report date is 10/23/2020.  ? ?The CancerNext-Expanded + RNAinsight gene panel offered by [employer] and includes sequencing and rearrangement analysis for the following 77 genes: AIP, ALK, APC, ATM, AXIN2, BAP1, BARD1, BLM, BMPR1A, BRCA1, BRCA2, BRIP1, CDC73, CDH1, CDK4, CDKN1B, CDKN2A, CHEK2, CTNNA1, DICER1, FANCC, FH, FLCN, GALNT12, KIF1B, LZTR1, MAX, MEN1, MET, MLH1, MSH2, MSH3, MSH6, MUTYH, NBN, NF1, NF2, NTHL1, PALB2, PHOX2B, PMS2, POT1, PRKAR1A, PTCH1, PTEN, RAD51C, RAD51D, RB1, RECQL, RET, SDHA, SDHAF2, SDHB, SDHC, SDHD, SMAD4, SMARCA4, SMARCB1, SMARCE1, STK11, SUFU, TMEM127, TP53, TSC1, TSC2, VHL and XRCC2 (sequencing and deletion/duplication); EGFR, EGLN1, HOXB13, KIT, MITF, PDGFRA, POLD1 and POLE  (sequencing only); EPCAM and GREM1 (deletion/duplication only). RNA data is routinely analyzed for use in variant interpretation for all genes. ?  ?03/31/2021 -  Chemotherapy  ? Patient is on Treatment Plan : BREAST Trastuzumab  + Pertuzumab q21d x 13 cycles  ? ?   ?04/15/2021 - 05/30/2021 Radiation Therapy  ? Site Technique Total Dose (Gy) Dose per Fx (Gy) Completed Fx Beam Energies  ?Breast, Left: Breast_Lt 3D 50.4/50.4 1.8 28/28 6X  ?Axilla, Left: Axilla_Lt 3D 50.4/50.4 1.8 28/28 6X, 10X  ? ?  ? ? ?INTERVAL HISTORY:  ?Ms. Bernat to review her survivorship care plan detailing her treatment course for breast cancer, as well as monitoring long-term side effects of that treatment, education regarding health maintenance, screening, and overall wellness and health promotion.    ? ?Overall, Ms. Soller reports feeling quite well.  She is taking Effexor daily and notes her hot flashes are improved.  At her last appointment she also underwent Helper and estradiol testing that indicated she is post-menopausal.   ? ?Her last echocardiogram was normal and showed EF of 55-60%.She completes Herceptin today and is ready to get her port out by her surgeon as soon as this can be scheduled.  Overall she is feeling well, she is exercising and has no concerns today.   ? ?REVIEW OF SYSTEMS:  ?Review of Systems  ?Constitutional:  Negative for appetite change, chills, fatigue, fever and unexpected weight change.  ?HENT:   Negative for hearing loss, lump/mass and trouble swallowing.   ?Eyes:  Negative for eye problems and icterus.  ?Respiratory:  Negative for chest tightness, cough and shortness of breath.   ?Cardiovascular:  Negative for chest pain, leg swelling  and palpitations.  ?Gastrointestinal:  Negative for abdominal distention, abdominal pain, constipation, diarrhea, nausea and vomiting.  ?Endocrine: Negative for hot flashes.  ?Genitourinary:  Negative for difficulty urinating.   ?Musculoskeletal:  Negative for arthralgias.  ?Skin:   Negative for itching and rash.  ?Neurological:  Negative for dizziness, extremity weakness, headaches and numbness.  ?Hematological:  Negative for adenopathy. Does not bruise/bleed easily.  ?Psychiatric/Behavioral:  Negative for depression. The patient is not nervous/anxious.   ?Breast: Denies any new nodularity, masses, tenderness, nipple changes, or nipple discharge.  ? ? ?ONCOLOGY TREATMENT TEAM:  ?1. Surgeon:  Dr. Georgette Dover at [facility] ?2. Medical Oncologist: Dr. Lindi Adie  ?3. Radiation Oncologist: Dr. Sondra Come ?  ? ?PAST MEDICAL/SURGICAL HISTORY:  ?Past Medical History:  ?Diagnosis Date  ? Anxiety   ? Arrhythmia   ? palpitation  ? Breast cancer ([place])   ? Depression   ? Family history of breast cancer   ? Family history of leukemia   ? Family history of prostate cancer   ? Family history of stomach cancer   ? Fibroid, uterine   ? Fibromyalgia   ? History of radiation therapy   ? Left breast- 04/15/21-05/30/21- Dr. Gery Pray  ? Hx of degenerative disc disease   ? Hypertension   ? Interstitial cystitis   ? Microadenoma   ? Migraines   ? PONV (postoperative nausea and vomiting)   ? Pre-diabetes   ? ?Past Surgical History:  ?Procedure Laterality Date  ? BREAST LUMPECTOMY Left 03/12/2021  ? BREAST LUMPECTOMY WITH RADIOACTIVE SEED AND SENTINEL LYMPH NODE BIOPSY Left 03/12/2021  ? Procedure: LEFT BREAST LUMPECTOMY WITH RADIOACTIVE SEED X2 AND LEFT SENTINEL LYMPH NODE BIOPSY;  Surgeon: Donnie Mesa, MD;  Location: [facility];  Service: General;  Laterality: Left;  ? CHOLECYSTECTOMY    ? PORTACATH PLACEMENT Right 10/17/2020  ? Procedure: INSERTION PORT-A-CATH, ULTRASOUND GUIDED;  Surgeon: Donnie Mesa, MD;  Location: [place];  Service: General;  Laterality: Right;  ? RADIOACTIVE SEED GUIDED AXILLARY SENTINEL LYMPH NODE Left 03/12/2021  ? Procedure: RADIOACTIVE SEED GUIDED LEFT AXILLARY SENTINEL LYMPH NODE BIOPSY;  Surgeon: Donnie Mesa, MD;  Location: [facility];  Service: General;  Laterality: Left;  ? ? ? ?ALLERGIES:  ?No Known Allergies ? ? ?CURRENT MEDICATIONS:  ?Outpatient Encounter Medications as of 10/28/2021  ?Medication Sig  ? anastrozole (ARIMIDEX) 1 MG tablet Take 1 tablet (1 mg total) by mouth daily.  ? ascorbic acid (VITAMIN C) 1000 MG tablet Take 1,000 mg by mouth daily.  ? BEE POLLEN PO Take 500 mg by mouth 2 (two) times daily. 1 in the morning and 2 at night  ? Biotin 1 MG CAPS Take 1 capsule by mouth daily. (Patient taking differently: Take 5,000 mcg by mouth daily.)  ? Cholecalciferol (DIALYVITE VITAMIN D 5000) 125 MCG (5000 UT) capsule Take 5,000 Units by mouth daily.  ? Coral Calcium 1000 (390 Ca) MG TABS Take 1 capsule by mouth 2 (two) times daily.  ? Investigational palmitoylethanolamide/placebo 400 MG capsule ACCRU-[place]-2102 Take 1 capsule by mouth twice daily for 8 weeks. One capsule (400 mg) in the morning and one capsule (400 mg) in the evening, take with food and swallow whole. Do not crush or open capsule. Store at room temperature. Keep container tightly closed.  ? lidocaine-prilocaine (EMLA) cream Apply 1 application topically as needed.  ? Magnesium 500 MG CAPS Take 1 capsule (500 mg total) by mouth daily.  ? metoprolol tartrate (LOPRESSOR) 25  MG tablet Take 25 mg by mouth daily.  ? NON FORMULARY Take 250 mg by mouth daily. Hyaluronic Acid Complex  ? Omega-3 Fatty Acids (OMEGA 3 500 PO) Take 500 mg by mouth daily.  ? OVER THE COUNTER MEDICATION Take 2 capsules by mouth 2 (two) times daily. Laminine supplement- 620 mg  ? venlafaxine XR (EFFEXOR-XR) 37.5 MG 24 hr capsule Take 1 capsule (37.5 mg total) by mouth daily with breakfast.  ? ibuprofen (ADVIL) 800 MG tablet Take 800 mg by mouth 2 (two) times daily as needed. (Patient not taking: Reported on 10/28/2021)  ? ondansetron (ZOFRAN) 8 MG tablet Take 1 tablet (8 mg total) by mouth every 8 (eight) hours as needed for nausea. (Patient not taking: Reported on 10/28/2021)  ? ?No  facility-administered encounter medications on file as of 10/28/2021.  ? ? ? ?ONCOLOGIC FAMILY HISTORY:  ?Family History  ?Problem Relation Age of Onset  ? Breast cancer Mother 63  ? Prostate cancer Father 72  ? [place]

## 2021-10-28 NOTE — Patient Instructions (Signed)
[facility] MEDICAL ONCOLOGY  Discharge Instructions: Thank you for choosing [facility] to provide your oncology and hematology care.   If you have a lab appointment with the Cancer Center, please go directly to the Cancer Center and check in at the registration area.   Wear comfortable clothing and clothing appropriate for easy access to any Portacath or PICC line.   We strive to give you quality time with your provider. You may need to reschedule your appointment if you arrive late (15 or more minutes).  Arriving late affects you and other patients whose appointments are after yours.  Also, if you miss three or more appointments without notifying the office, you may be dismissed from the clinic at the provider's discretion.      For prescription refill requests, have your pharmacy contact our office and allow 72 hours for refills to be completed.    Today you received the following chemotherapy and/or immunotherapy agents: Trastuzumab, Pertuzumab.      To help prevent nausea and vomiting after your treatment, we encourage you to take your nausea medication as directed.  BELOW ARE SYMPTOMS THAT SHOULD BE REPORTED IMMEDIATELY: *FEVER GREATER THAN 100.4 F (38 C) OR HIGHER *CHILLS OR SWEATING *NAUSEA AND VOMITING THAT IS NOT CONTROLLED WITH YOUR NAUSEA MEDICATION *UNUSUAL SHORTNESS OF BREATH *UNUSUAL BRUISING OR BLEEDING *URINARY PROBLEMS (pain or burning when urinating, or frequent urination) *BOWEL PROBLEMS (unusual diarrhea, constipation, pain near the anus) TENDERNESS IN MOUTH AND THROAT WITH OR WITHOUT PRESENCE OF ULCERS (sore throat, sores in mouth, or a toothache) UNUSUAL RASH, SWELLING OR PAIN  UNUSUAL VAGINAL DISCHARGE OR ITCHING   Items with * indicate a potential emergency and should be followed up as soon as possible or go to the Emergency Department if any problems should occur.  Please show the CHEMOTHERAPY ALERT CARD or IMMUNOTHERAPY ALERT CARD  at check-in to the Emergency Department and triage nurse.  Should you have questions after your visit or need to cancel or reschedule your appointment, please contact [facility]ICAL [facility]COL[facility]  Dept: [phone]  and follow the prompts.  Office hours are 8:00 a.m. to 4:30 p.m. Monday - Friday. Please note that voicemails left after 4:00 p.m. may not be returned until the following business day.  We are closed weekends and major holidays. You have access to a nurse at all times for urgent questions. Please call the main number to the clinic Dept: [phone] and follow the prompts.   For any non-urgent questions, you may also contact your provider using MyChart. We now offer e-Visits for anyone 18 and older to request care online for non-urgent symptoms. For details visit [email or web address].   Also download the MyChart app! Go to the app store, search "MyChart", open the app, select , and log in with your MyChart username and password.  Due to Covid, a mask is required upon entering the hospital/clinic. If you do not have a mask, one will be given to you upon arrival. For doctor visits, patients may have 1 support person aged 18 or older with them. For treatment visits, patients cannot have anyone with them due to current Covid guidelines and our immunocompromised population.  

## 2021-10-29 ENCOUNTER — Telehealth: Payer: Self-pay | Admitting: Hematology and Oncology

## 2021-10-29 NOTE — Telephone Encounter (Signed)
Scheduled appointment per 5/16 los. Patient is aware. ?

## 2021-10-30 ENCOUNTER — Ambulatory Visit: Payer: Commercial Managed Care - HMO | Attending: Surgery | Admitting: Physical Therapy

## 2021-10-30 DIAGNOSIS — Z483 Aftercare following surgery for neoplasm: Secondary | ICD-10-CM | POA: Insufficient documentation

## 2021-10-30 NOTE — Therapy (Signed)
OUTPATIENT PHYSICAL THERAPY SOZO SCREENING NOTE   Patient Name: Tammie [name] MRN: [id number] DOB:05-09-1976, 46 y.o., female Today's Date: 10/30/2021  PCP: [name], FNP REFERRING PROVIDER: Donnie Mesa, MD   PT End of Session - 10/30/21 1144     Visit Number 2    PT Start Time 1144    PT Stop Time 1156    PT Time Calculation (min) 12 min    Activity Tolerance Patient tolerated treatment well    Behavior During Therapy WFL for tasks assessed/performed             Past Medical History:  Diagnosis Date   Anxiety    Arrhythmia    palpitation   Breast cancer ([place])    Depression    Family history of breast cancer    Family history of leukemia    Family history of prostate cancer    Family history of stomach cancer    Fibroid, uterine    Fibromyalgia    History of radiation therapy    Left breast- 04/15/21-05/30/21- Dr. Gery Pray   Hx of degenerative disc disease    Hypertension    Interstitial cystitis    Microadenoma    Migraines    PONV (postoperative nausea and vomiting)    Pre-diabetes    Past Surgical History:  Procedure Laterality Date   BREAST LUMPECTOMY Left 03/12/2021   BREAST LUMPECTOMY WITH RADIOACTIVE SEED AND SENTINEL LYMPH NODE BIOPSY Left 03/12/2021   Procedure: LEFT BREAST LUMPECTOMY WITH RADIOACTIVE SEED X2 AND LEFT SENTINEL LYMPH NODE BIOPSY;  Surgeon: Donnie Mesa, MD;  Location: [facility];  Service: General;  Laterality: Left;   CHOLECYSTECTOMY     PORTACATH PLACEMENT Right 10/17/2020   Procedure: INSERTION PORT-A-CATH, ULTRASOUND GUIDED;  Surgeon: Donnie Mesa, MD;  Location: [place];  Service: General;  Laterality: Right;   RADIOACTIVE SEED GUIDED AXILLARY SENTINEL LYMPH NODE Left 03/12/2021   Procedure: RADIOACTIVE SEED GUIDED LEFT AXILLARY SENTINEL LYMPH NODE BIOPSY;  Surgeon: Donnie Mesa, MD;  Location: [place];  Service: General;  Laterality: Left;   Patient  Active Problem List   Diagnosis Date Noted   Port-A-Cath in place 12/20/2020   Genetic testing 10/23/2020   Family history of breast cancer    Family history of prostate cancer    Family history of stomach cancer    Family history of leukemia    Malignant neoplasm of lower-outer quadrant of left breast of female, estrogen receptor positive ([place]) 10/03/2020    REFERRING DIAG: left breast cancer at risk for lymphedema  THERAPY DIAG:  Aftercare following surgery for neoplasm  PERTINENT HISTORY:  Patient was diagnosed on 08/20/2020 with 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 It is ER/PR weakly positive and HER2 positive with a Ki67 of 20%.          L-DEX FLOWSHEETS - 10/30/21 1200       L-DEX LYMPHEDEMA SCREENING   Measurement Type Unilateral    L-DEX MEASUREMENT EXTREMITY Upper Extremity    POSITION  Standing    DOMINANT SIDE Right    At Risk Side Left    BASELINE SCORE (UNILATERAL) -1.1    L-DEX SCORE (UNILATERAL) 0.9    VALUE CHANGE (UNILAT) 2             PRECAUTIONS: left UE Lymphedema risk  SUBJECTIVE: Pt  here for SOZO screen  PAIN:  Are you having pain? No  SOZO SCREENING: Patient was assessed today using the SOZO machine to determine the lymphedema index score. This was compared to her baseline score. It was determined that she is within the recommended range when compared to her baseline and no further action is needed at this time. She will continue SOZO screenings. These are done every 3 months for 2 years post operatively followed by every 6 months for 2 years, and then annually.  Tammie [name], [place] 10/30/21 12:02 PM

## 2021-10-31 ENCOUNTER — Ambulatory Visit: Payer: Self-pay

## 2021-10-31 ENCOUNTER — Encounter: Payer: Self-pay | Admitting: *Deleted

## 2021-10-31 DIAGNOSIS — C50512 Malignant neoplasm of lower-outer quadrant of left female breast: Secondary | ICD-10-CM

## 2021-10-31 NOTE — Research (Signed)
ACCRU-[place]-2102 - TREATMENT OF ESTABLISHED CHEMOTHERAPY-INDUCED NEUROPATHY WITH N-PALMITOYLETHANOLAMIDE, A CANNABIMIMETIC NUTRACEUTICAL: A RANDOMIZED DOUBLE-BLIND PHASE II PILOT TRIAL   WEEK 3 PHONE CALL: Patient was contacted via phone today to complete study week 3 phone call. Identity was confirmed using two patient identifiers.    QUESTIONNAIRE: Patient states she forgot to complete her weekly questionnaire, but will complete it today and place it in the mail with the pre paid addressed envelope that was provided.    INVESTIGATIONAL PRODUCT: Patient reports taking 14 capsules since 10/24/21. Patient is always compliant with study medication. Patient is re-educated on the importance of taking study medication as directed twice daily once in the morning with food and once in the evening with food. Patient does verbalize understanding.  Patient denies use of other cannbinoid products such as CBD and/or THC. Patient denies any new medications since last week.  She plans on starting Anastrozole tomorrow as prescribed.   ADVERSE EFFECTS: Patient denies any nausea, vomiting or diarrhea in the past week. She does not report any other AEs.      The patient was thanked for their time and continued voluntary participation in this study. Patient has been provided direct contact information and is encouraged to contact this Nurse for any needs or questions.  Foye Spurling, BSN, RN, [place] Nurse II 10/31/2021 2:45 PM

## 2021-11-07 ENCOUNTER — Encounter: Payer: Self-pay | Admitting: *Deleted

## 2021-11-07 DIAGNOSIS — Z170 Estrogen receptor positive status [ER+]: Secondary | ICD-10-CM

## 2021-11-07 NOTE — Research (Signed)
ACCRU-[place]-2102 - TREATMENT OF ESTABLISHED CHEMOTHERAPY-INDUCED NEUROPATHY WITH N-PALMITOYLETHANOLAMIDE, A CANNABIMIMETIC NUTRACEUTICAL: A RANDOMIZED DOUBLE-BLIND PHASE II PILOT TRIAL   WEEK 4 PHONE CALL: Patient was contacted via phone today to complete study week 4 phone call. Identity was confirmed using two patient identifiers.    QUESTIONNAIRE: Patient states completed the weekly questionnaire today and she place it in the mail with the pre paid addressed envelope that was provided.    INVESTIGATIONAL PRODUCT: Patient reports taking 14 capsules since 10/31/21. Patient is always compliant with study medication. Patient is re-educated on the importance of taking study medication as directed twice daily once in the morning with food and once in the evening with food. Patient does verbalize understanding.  Patient denies use of other cannbinoid products such as CBD and/or THC. Patient denies any new medications since last week.   ADVERSE EFFECTS: Patient denies any nausea, vomiting or diarrhea in the past week. She reports lower back pain this past week with burning in her lower back and down her legs mostly on the right side. Patient states history of these symptoms with some "slipped discs" diagnosed years ago prior to this study. She says the burning type pain in her lower back along with the neuropathy pain in her foot makes things doubly uncomfortable. Encouraged patient to see her primary care doctor about her back pain since this is a pre existing condition which seems to be flaring up again. She may need to be assessed to make sure the slipped discs are not getting worse. She verbalized understanding. She does not report any other AEs.     The patient was thanked for their time and continued voluntary participation in this study. Patient has been provided direct contact information and is encouraged to contact this Nurse for any needs or questions.  Foye Spurling, BSN, RN, [employer] Research  Nurse II 11/07/2021 3:05 PM

## 2021-11-12 ENCOUNTER — Encounter: Payer: Self-pay | Admitting: Hematology and Oncology

## 2021-11-14 ENCOUNTER — Inpatient Hospital Stay: Payer: Commercial Managed Care - HMO

## 2021-11-14 ENCOUNTER — Encounter: Payer: Self-pay | Admitting: *Deleted

## 2021-11-14 ENCOUNTER — Telehealth: Payer: Self-pay | Admitting: *Deleted

## 2021-11-14 ENCOUNTER — Other Ambulatory Visit: Payer: Self-pay

## 2021-11-14 ENCOUNTER — Inpatient Hospital Stay: Payer: Commercial Managed Care - HMO | Attending: Hematology and Oncology

## 2021-11-14 ENCOUNTER — Other Ambulatory Visit: Payer: Self-pay | Admitting: *Deleted

## 2021-11-14 ENCOUNTER — Inpatient Hospital Stay (HOSPITAL_BASED_OUTPATIENT_CLINIC_OR_DEPARTMENT_OTHER): Payer: Commercial Managed Care - HMO | Admitting: Physician Assistant

## 2021-11-14 VITALS — BP 110/63 | HR 80 | Resp 16

## 2021-11-14 VITALS — BP 120/71 | HR 92 | Temp 98.6°F | Resp 18 | Wt 151.3 lb

## 2021-11-14 DIAGNOSIS — A080 Rotaviral enteritis: Secondary | ICD-10-CM | POA: Diagnosis not present

## 2021-11-14 DIAGNOSIS — C50512 Malignant neoplasm of lower-outer quadrant of left female breast: Secondary | ICD-10-CM

## 2021-11-14 DIAGNOSIS — E876 Hypokalemia: Secondary | ICD-10-CM | POA: Diagnosis not present

## 2021-11-14 DIAGNOSIS — R197 Diarrhea, unspecified: Secondary | ICD-10-CM

## 2021-11-14 DIAGNOSIS — Z79899 Other long term (current) drug therapy: Secondary | ICD-10-CM | POA: Insufficient documentation

## 2021-11-14 DIAGNOSIS — Z170 Estrogen receptor positive status [ER+]: Secondary | ICD-10-CM | POA: Insufficient documentation

## 2021-11-14 DIAGNOSIS — Z95828 Presence of other vascular implants and grafts: Secondary | ICD-10-CM

## 2021-11-14 DIAGNOSIS — R7401 Elevation of levels of liver transaminase levels: Secondary | ICD-10-CM | POA: Insufficient documentation

## 2021-11-14 DIAGNOSIS — R112 Nausea with vomiting, unspecified: Secondary | ICD-10-CM

## 2021-11-14 DIAGNOSIS — Z923 Personal history of irradiation: Secondary | ICD-10-CM | POA: Diagnosis not present

## 2021-11-14 DIAGNOSIS — E860 Dehydration: Secondary | ICD-10-CM | POA: Insufficient documentation

## 2021-11-14 DIAGNOSIS — D72819 Decreased white blood cell count, unspecified: Secondary | ICD-10-CM | POA: Insufficient documentation

## 2021-11-14 DIAGNOSIS — Z9221 Personal history of antineoplastic chemotherapy: Secondary | ICD-10-CM | POA: Diagnosis not present

## 2021-11-14 LAB — HEPATITIS PANEL, ACUTE
HCV Ab: NONREACTIVE
Hep A IgM: NONREACTIVE
Hep B C IgM: NONREACTIVE
Hepatitis B Surface Ag: NONREACTIVE

## 2021-11-14 LAB — CBC WITH DIFFERENTIAL (CANCER CENTER ONLY)
Abs Immature Granulocytes: 0.01 10*3/uL (ref 0.00–0.07)
Basophils Absolute: 0 10*3/uL (ref 0.0–0.1)
Basophils Relative: 0 %
Eosinophils Absolute: 0 10*3/uL (ref 0.0–0.5)
Eosinophils Relative: 1 %
HCT: 36.7 % (ref 36.0–46.0)
Hemoglobin: 12.8 g/dL (ref 12.0–15.0)
Immature Granulocytes: 0 %
Lymphocytes Relative: 20 %
Lymphs Abs: 0.6 10*3/uL — ABNORMAL LOW (ref 0.7–4.0)
MCH: 29.8 pg (ref 26.0–34.0)
MCHC: 34.9 g/dL (ref 30.0–36.0)
MCV: 85.3 fL (ref 80.0–100.0)
Monocytes Absolute: 0.5 10*3/uL (ref 0.1–1.0)
Monocytes Relative: 17 %
Neutro Abs: 2 10*3/uL (ref 1.7–7.7)
Neutrophils Relative %: 62 %
Platelet Count: 152 10*3/uL (ref 150–400)
RBC: 4.3 MIL/uL (ref 3.87–5.11)
RDW: 13.8 % (ref 11.5–15.5)
WBC Count: 3.2 10*3/uL — ABNORMAL LOW (ref 4.0–10.5)
nRBC: 0 % (ref 0.0–0.2)

## 2021-11-14 LAB — CMP (CANCER CENTER ONLY)
ALT: 155 U/L — ABNORMAL HIGH (ref 0–44)
AST: 86 U/L — ABNORMAL HIGH (ref 15–41)
Albumin: 4.1 g/dL (ref 3.5–5.0)
Alkaline Phosphatase: 56 U/L (ref 38–126)
Anion gap: 8 (ref 5–15)
BUN: 18 mg/dL (ref 6–20)
CO2: 26 mmol/L (ref 22–32)
Calcium: 9.1 mg/dL (ref 8.9–10.3)
Chloride: 104 mmol/L (ref 98–111)
Creatinine: 0.74 mg/dL (ref 0.44–1.00)
GFR, Estimated: >60 mL/min (ref >60)
Glucose, Bld: 106 mg/dL — ABNORMAL HIGH (ref 70–99)
Potassium: 3 mmol/L — ABNORMAL LOW (ref 3.5–5.1)
Sodium: 138 mmol/L (ref 135–145)
Total Bilirubin: 0.5 mg/dL (ref 0.3–1.2)
Total Protein: 7.3 g/dL (ref 6.5–8.1)

## 2021-11-14 LAB — MAGNESIUM: Magnesium: 1.7 mg/dL (ref 1.7–2.4)

## 2021-11-14 LAB — C DIFFICILE QUICK SCREEN W PCR REFLEX
C Diff antigen: NEGATIVE
C Diff interpretation: NOT DETECTED
C Diff toxin: NEGATIVE

## 2021-11-14 MED ORDER — POTASSIUM CHLORIDE CRYS ER 20 MEQ PO TBCR: 20.0000 meq | EXTENDED_RELEASE_TABLET | Freq: Two times a day (BID) | ORAL | 0 refills | Status: DC

## 2021-11-14 MED ORDER — SODIUM CHLORIDE 0.9% FLUSH
10.0000 mL | Freq: Once | INTRAVENOUS | Status: AC
  Administered 2021-11-14: 10 mL

## 2021-11-14 MED ORDER — SODIUM CHLORIDE 0.9 % IV SOLN: Freq: Once | INTRAVENOUS | Status: AC

## 2021-11-14 MED ORDER — HEPARIN SOD (PORK) LOCK FLUSH 100 UNIT/ML IV SOLN
500.0000 [IU] | Freq: Once | INTRAVENOUS | Status: AC
  Administered 2021-11-14: 500 [IU]

## 2021-11-14 MED ORDER — DIPHENOXYLATE-ATROPINE 2.5-0.025 MG PO TABS: 2.0000 | ORAL_TABLET | Freq: Four times a day (QID) | ORAL | 0 refills | Status: AC | PRN

## 2021-11-14 MED ORDER — ONDANSETRON HCL 4 MG/2ML IJ SOLN
8.0000 mg | Freq: Once | INTRAMUSCULAR | Status: AC
  Administered 2021-11-14: 8 mg via INTRAVENOUS
  Filled 2021-11-14: qty 4

## 2021-11-14 MED ORDER — SODIUM CHLORIDE 0.9 % IV SOLN: 8.0000 mg | Freq: Once | INTRAVENOUS | Status: DC

## 2021-11-14 NOTE — Patient Instructions (Signed)
Rehydration, Adult Rehydration is the replacement of body fluids, salts, and minerals (electrolytes) that are lost during dehydration. Dehydration is when there is not enough water or other fluids in the body. This happens when you lose more fluids than you take in. Common causes of dehydration include: Not drinking enough fluids. This can occur when you are ill or doing activities that require a lot of energy, especially in hot weather. Conditions that cause loss of water or other fluids, such as diarrhea, vomiting, sweating, or urinating a lot. Other illnesses, such as fever or infection. Certain medicines, such as those that remove excess fluid from the body (diuretics). Symptoms of mild or moderate dehydration may include thirst, dry lips and mouth, and dizziness. Symptoms of severe dehydration may include increased heart rate, confusion, fainting, and not urinating. For severe dehydration, you may need to get fluids through an IV at the hospital. For mild or moderate dehydration, you can usually rehydrate at home by drinking certain fluids as told by your health care provider. What are the risks? Generally, rehydration is safe. However, taking in too much fluid (overhydration) can be a problem. This is rare. Overhydration can cause an electrolyte imbalance, kidney failure, or a decrease in salt (sodium) levels in the body. Supplies needed You will need an oral rehydration solution (ORS) if your health care provider tells you to use one. This is a drink to treat dehydration. It can be found in pharmacies and retail stores. How to rehydrate Fluids Follow instructions from your health care provider for rehydration. The kind of fluid and the amount you should drink depend on your condition. In general, you should choose drinks that you prefer. If told by your health care provider, drink an ORS. Make an ORS by following instructions on the package. Start by drinking small amounts, about  cup (120  mL) every 5-10 minutes. Slowly increase how much you drink until you have taken the amount recommended by your health care provider. Drink enough clear fluids to keep your urine pale yellow. If you were told to drink an ORS, finish it first, then start slowly drinking other clear fluids. Drink fluids such as: Water. This includes sparkling water and flavored water. Drinking only water can lead to having too little sodium in your body (hyponatremia). Follow the advice of your health care provider. Water from ice chips you suck on. Fruit juice with water you add to it (diluted). Sports drinks. Hot or cold herbal teas. Broth-based soups. Milk or milk products. Food Follow instructions from your health care provider about what to eat while you rehydrate. Your health care provider may recommend that you slowly begin eating regular foods in small amounts. Eat foods that contain a healthy balance of electrolytes, such as bananas, oranges, potatoes, tomatoes, and spinach. Avoid foods that are greasy or contain a lot of sugar. In some cases, you may get nutrition through a feeding tube that is passed through your nose and into your stomach (nasogastric tube, or NG tube). This may be done if you have uncontrolled vomiting or diarrhea. Beverages to avoid  Certain beverages may make dehydration worse. While you rehydrate, avoid drinking alcohol. How to tell if you are recovering from dehydration You may be recovering from dehydration if: You are urinating more often than before you started rehydrating. Your urine is pale yellow. Your energy level improves. You vomit less frequently. You have diarrhea less frequently. Your appetite improves or returns to normal. You feel less dizzy or less light-headed.   Your skin tone and color start to look more normal. Follow these instructions at home: Take over-the-counter and prescription medicines only as told by your health care provider. Do not take sodium  tablets. Doing this can lead to having too much sodium in your body (hypernatremia). Contact a health care provider if: You continue to have symptoms of mild or moderate dehydration, such as: Thirst. Dry lips. Slightly dry mouth. Dizziness. Dark urine or less urine than normal. Muscle cramps. You continue to vomit or have diarrhea. Get help right away if you: Have symptoms of dehydration that get worse. Have a fever. Have a severe headache. Have been vomiting and the following happens: Your vomiting gets worse or does not go away. Your vomit includes blood or green matter (bile). You cannot eat or drink without vomiting. Have problems with urination or bowel movements, such as: Diarrhea that gets worse or does not go away. Blood in your stool (feces). This may cause stool to look black and tarry. Not urinating, or urinating only a small amount of very dark urine, within 6-8 hours. Have trouble breathing. Have symptoms that get worse with treatment. These symptoms may represent a serious problem that is an emergency. Do not wait to see if the symptoms will go away. Get medical help right away. Call your local emergency services (911 in the [place] Do not drive yourself to the hospital. Summary Rehydration is the replacement of body fluids and minerals (electrolytes) that are lost during dehydration. Follow instructions from your health care provider for rehydration. The kind of fluid and amount you should drink depend on your condition. Slowly increase how much you drink until you have taken the amount recommended by your health care provider. Contact your health care provider if you continue to show signs of mild or moderate dehydration. This information is not intended to replace advice given to you by your health care provider. Make sure you discuss any questions you have with your health care provider. Document Revised: 08/02/2019 Document Reviewed: 06/12/2019 Elsevier Patient  Education  2023 Elsevier Inc.  

## 2021-11-14 NOTE — Progress Notes (Signed)
Symptom Management Consult note [place]    Patient Care Team: [name], FNP as PCP - General (Family Medicine) Donnie Mesa, MD as Consulting Physician (General Surgery) Nicholas Lose, MD as Consulting Physician (Hematology and Oncology) Gery Pray, MD as Consulting Physician (Radiation Oncology)    Name of the patient: Tammie [name]  [id number]  01-Dec-1975   Date of visit: 11/14/2021    Chief complaint/ Reason for visit- diarrhea, vomiting  Oncology History  Malignant neoplasm of lower-outer quadrant of left breast of female, estrogen receptor positive ([place])  10/01/2020 Initial Diagnosis   Screening mammogram showed a left breast mass and calcifications. Diagnostic mammogram and US showed a 1.7cm and 0.5cm mass at the 5 o'clock position in the left breast, with one 0.4cm abnormal right axillary lymph node. 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   10/09/2020 Cancer Staging   Staging form: Breast, AJCC 8th Edition - Clinical stage from 10/09/2020: Stage IB (cT2, cN1, cM0, G3, ER+, PR+, HER2+) - Signed by Nicholas Lose, MD on 10/09/2020 Stage prefix: Initial diagnosis Histologic grading system: 3 grade system Percentage of positive estrogen receptors (%): 5 Percentage of positive progesterone receptors (%): 1 Ki-67 (%): 20    10/18/2020 - 02/27/2021 Chemotherapy   Patient is on Treatment Plan : BREAST  Docetaxel + Carboplatin + Trastuzumab + Pertuzumab  (TCHP) q21d       10/23/2020 Genetic Testing   Negative genetic testing:  No pathogenic variants detected on the [employer] CancerNext-Expanded + RNAinsight panel. The report date is 10/23/2020.   The CancerNext-Expanded + RNAinsight gene panel offered by [employer] and includes sequencing and rearrangement analysis for the following 77 genes: AIP, ALK, APC, ATM, AXIN2, BAP1, BARD1, BLM, BMPR1A, BRCA1, BRCA2, BRIP1, CDC73, CDH1,  CDK4, CDKN1B, CDKN2A, CHEK2, CTNNA1, DICER1, FANCC, FH, FLCN, GALNT12, KIF1B, LZTR1, MAX, MEN1, MET, MLH1, MSH2, MSH3, MSH6, MUTYH, NBN, NF1, NF2, NTHL1, PALB2, PHOX2B, PMS2, POT1, PRKAR1A, PTCH1, PTEN, RAD51C, RAD51D, RB1, RECQL, RET, SDHA, SDHAF2, SDHB, SDHC, SDHD, SMAD4, SMARCA4, SMARCB1, SMARCE1, STK11, SUFU, TMEM127, TP53, TSC1, TSC2, VHL and XRCC2 (sequencing and deletion/duplication); EGFR, EGLN1, HOXB13, KIT, MITF, PDGFRA, POLD1 and POLE (sequencing only); EPCAM and GREM1 (deletion/duplication only). RNA data is routinely analyzed for use in variant interpretation for all genes.   03/31/2021 -  Chemotherapy   Patient is on Treatment Plan : BREAST Trastuzumab  + Pertuzumab q21d x 13 cycles      04/15/2021 - 05/30/2021 Radiation Therapy   Site Technique Total Dose (Gy) Dose per Fx (Gy) Completed Fx Beam Energies  Breast, Left: Breast_Lt 3D 50.4/50.4 1.8 28/28 6X  Axilla, Left: Axilla_Lt 3D 50.4/50.4 1.8 28/28 6X, 10X       Current Therapy: Herceptin and Perjeta day 1 cycle 11 on 05/16, Anastrozole started 10/28/21   Interval history- Tammie [name] is a 46 year old female with oncologic history as above seen in [facility] today with chief complaint of nausea vomiting and diarrhea x4 days.  Patient states his symptoms started after eating out at a restaurant.  She was dining with her husband who is asymptomatic she reports.  Patient admits to having over 10 episodes of diarrhea per day.  She describes her stool as liquid and yellow.  She has tried taking Imodium without improvement.  She has vomited 3 times in the last 24 hours.  She states at first it was the food she had been eating now  has progressed to a yellow bile-like color.  She has been taking Zofran which helps with nausea.  She states the Zofran is helping control the nausea and she has been able to keep fluids down over the last 2 days.  She denies any fever, chills, chest pain, abdominal pain, urinary symptoms, blood in stool, rash.  No  recent antibiotic use.  Patient is currently 5 weeks into research study.  Prior to symptoms starting she had been tolerating medication well she reports.      ROS  All other systems are reviewed and are negative for acute change except as noted in the HPI.    No Known Allergies   Past Medical History:  Diagnosis Date   Anxiety    Arrhythmia    palpitation   Breast cancer (HCC)    Depression    Family history of breast cancer    Family history of leukemia    Family history of prostate cancer    Family history of stomach cancer    Fibroid, uterine    Fibromyalgia    History of radiation therapy    Left breast- 04/15/21-05/30/21- Dr. Antony Blackbird   Hx of degenerative disc disease    Hypertension    Interstitial cystitis    Microadenoma    Migraines    PONV (postoperative nausea and vomiting)    Pre-diabetes      Past Surgical History:  Procedure Laterality Date   BREAST LUMPECTOMY Left 03/12/2021   BREAST LUMPECTOMY WITH RADIOACTIVE SEED AND SENTINEL LYMPH NODE BIOPSY Left 03/12/2021   Procedure: LEFT BREAST LUMPECTOMY WITH RADIOACTIVE SEED X2 AND LEFT SENTINEL LYMPH NODE BIOPSY;  Surgeon: Manus Rudd, MD;  Location: [facility];  Service: General;  Laterality: Left;   CHOLECYSTECTOMY     PORTACATH PLACEMENT Right 10/17/2020   Procedure: INSERTION PORT-A-CATH, ULTRASOUND GUIDED;  Surgeon: Manus Rudd, MD;  Location: [facility];  Service: General;  Laterality: Right;   RADIOACTIVE SEED GUIDED AXILLARY SENTINEL LYMPH NODE Left 03/12/2021   Procedure: RADIOACTIVE SEED GUIDED LEFT AXILLARY SENTINEL LYMPH NODE BIOPSY;  Surgeon: Manus Rudd, MD;  Location: [facility];  Service: General;  Laterality: Left;    Social History   Socioeconomic History   Marital status: Married    Spouse name: Not on file   Number of children: Not on file   Years of education: Not on file   Highest education level: Not on file   Occupational History   Not on file  Tobacco Use   Smoking status: Never   Smokeless tobacco: Never  Vaping Use   Vaping Use: Never used  Substance and Sexual Activity   Alcohol use: Never   Drug use: Never   Sexual activity: Not on file  Other Topics Concern   Not on file  Social History Narrative   Not on file   Social Determinants of Health   Financial Resource Strain: Not on file  Food Insecurity: Not on file  Transportation Needs: Not on file  Physical Activity: Not on file  Stress: Not on file  Social Connections: Not on file  Intimate Partner Violence: Not on file    Family History  Problem Relation Age of Onset   Breast cancer Mother 42   Prostate cancer Father 8   Stomach cancer Paternal Grandmother        dx older than 29   Leukemia Paternal Grandfather 62       dx older than 23  Breast cancer Paternal Aunt    Breast cancer Other        mother's first cousin     Current Outpatient Medications:    diphenoxylate-atropine (LOMOTIL) 2.5-0.025 MG tablet, Take 2 tablets by mouth 4 (four) times daily as needed for up to 2 days for diarrhea or loose stools., Disp: 16 tablet, Rfl: 0   potassium chloride SA (KLOR-CON M) 20 MEQ tablet, Take 1 tablet (20 mEq total) by mouth 2 (two) times daily for 7 days., Disp: 14 tablet, Rfl: 0   anastrozole (ARIMIDEX) 1 MG tablet, Take 1 tablet (1 mg total) by mouth daily., Disp: 30 tablet, Rfl: 11   ascorbic acid (VITAMIN C) 1000 MG tablet, Take 1,000 mg by mouth daily., Disp: , Rfl:    BEE POLLEN PO, Take 500 mg by mouth 2 (two) times daily. 1 in the morning and 2 at night, Disp: , Rfl:    Biotin 1 MG CAPS, Take 1 capsule by mouth daily. (Patient taking differently: Take 5,000 mcg by mouth daily.), Disp: 30 capsule, Rfl:    Cholecalciferol (DIALYVITE VITAMIN D 5000) 125 MCG (5000 UT) capsule, Take 5,000 Units by mouth daily., Disp: , Rfl:    Coral Calcium 1000 (390 Ca) MG TABS, Take 1 capsule by mouth 2 (two) times daily., Disp:  , Rfl:    ibuprofen (ADVIL) 800 MG tablet, Take 800 mg by mouth 2 (two) times daily as needed. (Patient not taking: Reported on 10/28/2021), Disp: , Rfl:    Investigational palmitoylethanolamide/placebo 400 MG capsule ACCRU-[place]-2102, Take 1 capsule by mouth twice daily for 8 weeks. One capsule (400 mg) in the morning and one capsule (400 mg) in the evening, take with food and swallow whole. Do not crush or open capsule. Store at room temperature. Keep container tightly closed., Disp: 120 capsule, Rfl: 0   lidocaine-prilocaine (EMLA) cream, Apply 1 application topically as needed., Disp: 30 g, Rfl: 0   Magnesium 500 MG CAPS, Take 1 capsule (500 mg total) by mouth daily., Disp: , Rfl: 0   metoprolol tartrate (LOPRESSOR) 25 MG tablet, Take 25 mg by mouth daily., Disp: , Rfl:    NON FORMULARY, Take 250 mg by mouth daily. Hyaluronic Acid Complex, Disp: , Rfl:    Omega-3 Fatty Acids (OMEGA 3 500 PO), Take 500 mg by mouth daily., Disp: , Rfl:    ondansetron (ZOFRAN) 8 MG tablet, Take 1 tablet (8 mg total) by mouth every 8 (eight) hours as needed for nausea. (Patient not taking: Reported on 10/28/2021), Disp: 30 tablet, Rfl: 3   OVER THE COUNTER MEDICATION, Take 2 capsules by mouth 2 (two) times daily. Laminine supplement- 620 mg, Disp: , Rfl:    venlafaxine XR (EFFEXOR-XR) 37.5 MG 24 hr capsule, Take 1 capsule (37.5 mg total) by mouth daily with breakfast., Disp: 30 capsule, Rfl: 6  PHYSICAL EXAM: ECOG FS:1 - Symptomatic but completely ambulatory    Vitals:   11/14/21 1411  BP: 120/71  Pulse: 92  Resp: 18  Temp: 98.6 F (37 C)  TempSrc: Oral  SpO2: 100%  Weight: 151 lb 4.8 oz (68.6 kg)   Physical Exam Vitals and nursing note reviewed.  Constitutional:      Appearance: She is well-developed. She is not ill-appearing or toxic-appearing.  HENT:     Head: Normocephalic and atraumatic.     Nose: Nose normal.     Mouth/Throat:     Mouth: Mucous membranes are dry.  Eyes:     General: No scleral  icterus.       Right eye: No discharge.        Left eye: No discharge.     Conjunctiva/sclera: Conjunctivae normal.  Neck:     Vascular: No JVD.  Cardiovascular:     Rate and Rhythm: Normal rate and regular rhythm.     Pulses: Normal pulses.     Heart sounds: Normal heart sounds.  Pulmonary:     Effort: Pulmonary effort is normal.     Breath sounds: Normal breath sounds.  Chest:     Comments: Port without signs of infection Abdominal:     General: Bowel sounds are normal. There is no distension.     Palpations: Abdomen is soft. There is no mass.     Tenderness: There is no abdominal tenderness. There is no guarding or rebound.     Hernia: No hernia is present.  Musculoskeletal:        General: Normal range of motion.     Cervical back: Normal range of motion.  Skin:    General: Skin is warm and dry.     Capillary Refill: Capillary refill takes less than 2 seconds.  Neurological:     Mental Status: She is oriented to person, place, and time.     GCS: GCS eye subscore is 4. GCS verbal subscore is 5. GCS motor subscore is 6.     Comments: Fluent speech, no facial droop.  Psychiatric:        Behavior: Behavior normal.       LABORATORY DATA: I have reviewed the data as listed    Latest Ref Rng & Units 11/14/2021    2:03 PM 10/07/2021    9:02 AM 08/26/2021    9:07 AM  CBC  WBC 4.0 - 10.5 K/uL 3.2   3.9   4.2    Hemoglobin 12.0 - 15.0 g/dL 12.8   11.3   10.8    Hematocrit 36.0 - 46.0 % 36.7   33.8   32.3    Platelets 150 - 400 K/uL 152   169   186          Latest Ref Rng & Units 11/14/2021    2:03 PM 10/07/2021    9:02 AM 08/26/2021    9:07 AM  CMP  Glucose 70 - 99 mg/dL 106   105   105    BUN 6 - 20 mg/dL $Remove'18   20   20    'vVEYKgP$ Creatinine 0.44 - 1.00 mg/dL 0.74   0.77   0.75    Sodium 135 - 145 mmol/L 138   142   139    Potassium 3.5 - 5.1 mmol/L 3.0   3.8   3.6    Chloride 98 - 111 mmol/L 104   104   103    CO2 22 - 32 mmol/L $RemoveB'26   31   28    'PNIwphJN$ Calcium 8.9 - 10.3 mg/dL 9.1    9.3   9.4    Total Protein 6.5 - 8.1 g/dL 7.3   6.7   6.7    Total Bilirubin 0.3 - 1.2 mg/dL 0.5   0.2   0.3    Alkaline Phos 38 - 126 U/L 56   57   58    AST 15 - 41 U/L 86   22   19    ALT 0 - 44 U/L 155   21   16         RADIOGRAPHIC STUDIES: I  have personally reviewed the radiological images as listed and agreed with the findings in the report. No images are attached to the encounter. No results found.   ASSESSMENT & PLAN: Patient is a 46 y.o. female  with oncologic history of malignant neoplasm of lower-outer quadrant of left breast, ER + followed by Dr. Lindi Adie.  I have viewed most recent oncology and research note and lab work.  #) Diarrhea and nausea-patient is nontoxic-appearing.  She is afebrile and hemodynamically stable.  Clinically she looks dehydrated likely from high GI output.  She was given a liter of IV fluids and Zofran here in clinic.  C. difficile testing is negative.  Labs today include CBC which shows mild leukopenia with a white count of 3.2, no anemia, ANC WNL.  CMP significant for hypokalemia 3.0 and elevated AST and ALT at 86/155 with normal t. Bili.  Patient has a nontender abdomen, do not feel emergent imaging is needed at this time.  Will treat hypokalemia with p.o. potassium, prescription sent to pharmacy.  Given that Imodium is not helping and C. difficile testing is negative prescription for Lomotil sent to pharmacy as well.  GI PCR panel ordered as well as hepatitis panel which are still in process.  Encourage patient to stay well-hydrated and continue symptom management at home.   Discussed ED precautions should symptoms worsen.  Visit Diagnosis: 1. Nausea and vomiting, unspecified vomiting type   2. Diarrhea, unspecified type   3. Hypokalemia      Orders Placed This Encounter  Procedures   Gastrointestinal Panel by PCR , Stool    All questions were answered. The patient knows to call the clinic with any problems, questions or concerns. No barriers  to learning was detected.  I have spent a total of 30 minutes minutes of face-to-face and non-face-to-face time, preparing to see the patient, obtaining and/or reviewing separately obtained history, performing a medically appropriate examination, counseling and educating the patient, ordering tests,  documenting clinical information in the electronic health record, and care coordination.     Thank you for allowing me to participate in the care of this patient.    Barrie Folk, PA-C Department of Hematology/Oncology [facility] at [facility] Phone: [phone]  [phone]    11/14/2021 5:31 PM

## 2021-11-14 NOTE — Telephone Encounter (Signed)
Received call from pt with complaint of vomiting and diarrhea x 3 days not alleviated by OTC medication.  Pt requesting to be seen in office for further evaluation.  [facility] visit scheduled, pt verbalized understanding of appt date and time.

## 2021-11-14 NOTE — Research (Signed)
ACCRU-[place]-2102 - TREATMENT OF ESTABLISHED CHEMOTHERAPY-INDUCED NEUROPATHY WITH N-PALMITOYLETHANOLAMIDE, A CANNABIMIMETIC NUTRACEUTICAL: A RANDOMIZED DOUBLE-BLIND PHASE II PILOT TRIAL   WEEK 5:  Patient being seen and treated in Symptom Management Clinic today for nausea, vomiting and diarrhea since Tuesday afternoon. Met with patient while she is getting IVFs and worked up for other possible treatment.   INVESTIGATIONAL PRODUCT: Patient reports taking 11 capsules since 11/07/21. She thinks she missed 3 doses due to nausea/vomiting and diarrhea. Patient is always compliant with study medication.  Patient denies use of other cannbinoid products such as CBD and/or THC. Patient denies any new medications since last week.   QUESTIONNAIRE: Patient states she completed the weekly questionnaire yesterday and she place it in the mail with the pre paid addressed envelope that was provided tomorrow.   ADVERSE EFFECTS:    Patient reports nausea started Tuesday afternoon with vomiting every day and diarrhea 4 to 5 times per day (baseline is once per day).  Dr. Lindi Adie is out of the office today. Informed Dr. Alvy Bimler of patient's participation in study, completing 5 out of 8 weeks of PEA/Placebo and now presenting with current symptoms for the past 3 days.  Dr. Alvy Bimler says patient's symptoms could possibly be related to study drug. She instructs for patient to hold the study drug until symptoms resolve. Patient can resume when she feels better but if symptoms return she should stop study drug again.   Study/Protocol: ACCRU [place]-2102 Week 5 -11/07/21-11/13/21   Event Grade Onset Date Resolved Date Drug Name Attribution Treatment Comments  Nausea Grade 2 11/11/21 ongoing PEA/Placebo Possible IVFs  [facility]  Vomiting Grade 2 11/11/21 ongoing PEA/Placebo Possible IVFs  [facility]  Diarrhea Grade 2 11/11/21 ongoing PEA/Placebo Possible IVFs [facility]   PLAN:  Work up and treatment for symptoms per [facility].  Hold study drug until symptoms  resolve. If symptoms return after resuming study drug then hold it again and notify research nurse. Research nurse will call patient on Monday to see how she is feeling.  Patient verbalized understanding.    The patient was thanked for their time and continued voluntary participation in this study. Patient has been provided direct contact information and is encouraged to contact this Nurse for any needs or questions.  Foye Spurling, BSN, RN, CCRP Clinical Research Nurse II 11/14/2021 2:46 PM

## 2021-11-14 NOTE — Patient Instructions (Signed)
If C. Diff testing is NEGATIVE you can take Lomotil to help stop the diarrhea. If positive do not take Lomotil and we will need to call you in an antibiotic.  Prescription for potassium pills sent to your pharmacy. Take these as prescribed.   Do your best to stay hydrated by drinking plenty of fluids.  If symptoms worsen you should go to the emergency department or call 911.

## 2021-11-15 LAB — GASTROINTESTINAL PANEL BY PCR, STOOL (REPLACES STOOL CULTURE)

## 2021-11-17 ENCOUNTER — Telehealth: Payer: Self-pay | Admitting: *Deleted

## 2021-11-17 NOTE — Telephone Encounter (Signed)
Patient called research nurse back to report that that she started having her "period" this past Friday 11/14/21.  Patient states she thought she was post menopausal based on hormone levels done in April and that patient has not had her period in about one year.  Patient says it feels like a "normal period" so far.  She also started taking Anastrozole 11/01/21.  She wants to know if anything to worry about or is this normal?  Message sent to Dr. Lindi Adie. Foye Spurling, BSN, RN, [employer] Research Nurse II 11/17/2021 11:19 AM

## 2021-11-17 NOTE — Telephone Encounter (Addendum)
ACCRU XA-1287- Symptoms Follow up;  Patient reports she is feeling better today but not fully recovered. She has not vomited since Friday. She continues to have mild nausea managed with Zofran. She has "a little" diarrhea with one stool this morning.  Patient states she is drinking "Liquid I.V." which has electrolytes including potassium. She is also eating bland foods. Encouraged patient to continue to drink plenty of fluids and eat bland foods as tolerated. Suggested toast, rice, bananas, applesauce, soups, etc.. Instructed patient to call the cancer center if her symptoms worsen again so we can provide IVFs if indicated.  Informed patient stool specimen was positive for Rotovirus which she could have contracted about anywhere. It is possible to eat contaminated food from a restaurant as she feels she started getting sick after she ate out last week.  Encouraged patient to practice good handwashing.  She verbalized understanding.  PEA/Placebo; Patient reports she started taking the study drug again this morning.  Instructed patient to hold it again if her symptoms worsen/return. She verbalized understanding. Plan; Research nurse will call patient this Friday for scheduled week 6 phone call. Instructed patient to call if any problems or questions before Friday. She verbalized understanding.   Study/Protocol: ACCRU [id number] Adverse Events Table Week 5 & 6 11/07/21-11/17/21   Event Grade Onset Date Resolved Date Drug Name Attribution Treatment Comments  RotoVirus  symptoms included nausea, vomiting, diarrhea and resultant hypokalemia Grade 2 11/11/21 Ongoing PEA/Placebo Unrelated IVFs, Zofran      Foye Spurling, BSN, RN, [place] Nurse II 11/17/2021 10:41 AM

## 2021-11-18 ENCOUNTER — Inpatient Hospital Stay: Payer: Commercial Managed Care - HMO

## 2021-11-18 ENCOUNTER — Telehealth: Payer: Self-pay | Admitting: *Deleted

## 2021-11-18 ENCOUNTER — Other Ambulatory Visit: Payer: Self-pay

## 2021-11-18 DIAGNOSIS — Z95828 Presence of other vascular implants and grafts: Secondary | ICD-10-CM

## 2021-11-18 DIAGNOSIS — C50512 Malignant neoplasm of lower-outer quadrant of left female breast: Secondary | ICD-10-CM

## 2021-11-18 MED ORDER — SODIUM CHLORIDE 0.9% FLUSH
10.0000 mL | Freq: Once | INTRAVENOUS | Status: AC
  Administered 2021-11-18: 10 mL

## 2021-11-18 MED ORDER — HEPARIN SOD (PORK) LOCK FLUSH 100 UNIT/ML IV SOLN
500.0000 [IU] | Freq: Once | INTRAVENOUS | Status: AC
  Administered 2021-11-18: 500 [IU]

## 2021-11-18 NOTE — Telephone Encounter (Signed)
Received call from pt with complaint of menstrual bleeding starting Friday 11/14/21.  Pt states she has not had any menstrual bleeding x1 year and has recently started Anastrozole on 11/01/21.  RN reviewed with NP and verbal orders received to obtain repeat hormone levels to assess pt menopause status.  Orders placed, appt scheduled and pt verbalized understanding of appt date and time.

## 2021-11-20 LAB — FOLLICLE STIMULATING HORMONE: FSH: 43.7 m[IU]/mL

## 2021-11-20 LAB — LUTEINIZING HORMONE: LH: 39.7 m[IU]/mL

## 2021-11-21 ENCOUNTER — Other Ambulatory Visit: Payer: Self-pay

## 2021-11-21 ENCOUNTER — Encounter (HOSPITAL_BASED_OUTPATIENT_CLINIC_OR_DEPARTMENT_OTHER): Payer: Self-pay | Admitting: Surgery

## 2021-11-21 ENCOUNTER — Telehealth: Payer: Self-pay

## 2021-11-21 DIAGNOSIS — C50512 Malignant neoplasm of lower-outer quadrant of left female breast: Secondary | ICD-10-CM

## 2021-11-21 LAB — ESTRADIOL, ULTRA SENS: Estradiol, Sensitive: 7.1 pg/mL

## 2021-11-21 NOTE — Research (Signed)
ACCRU-[place]-2102 - TREATMENT OF ESTABLISHED CHEMOTHERAPY-INDUCED NEUROPATHY WITH N-PALMITOYLETHANOLAMIDE, A CANNABIMIMETIC NUTRACEUTICAL: A RANDOMIZED DOUBLE-BLIND PHASE II PILOT TRIAL   WEEK 6:  Patient was contacted via phone today to complete study week 6 phone call. Identity was confirmed using two patient identifiers.   INVESTIGATIONAL PRODUCT: Patient reports taking ~8 capsules since 11/14/21. She thinks she missed ~6 doses over the weekend while she was recovering from [place]; drug was held per MD. Patient restarted on 11/17/21 and has been compliant since that date. Patient is usually compliant with study medication.  Patient denies use of other cannbinoid products such as CBD and/or THC. Patient denies any new medications since last week.   QUESTIONNAIRE: Patient states she completed the weekly questionnaire and she will place it in the mail with the pre paid addressed envelope that was provided on Sunday when she returns from vacation.   ADVERSE EFFECTS:    Patient reports rotovirus symptoms (nausea, vomiting, diarrhea) resolved on Tuesday, 11/18/21.   Study/Protocol: ACCRU [place]-2102 Adverse Events Table Week 6 11/14/21-11/20/21   Event Grade Onset Date Resolved Date Drug Name Attribution Treatment Comments  RotoVirus  symptoms included nausea, vomiting, diarrhea and resultant hypokalemia Grade 2 11/11/21 11/18/21 PEA/Placebo Unrelated IVFs, Zofran      The patient was thanked for their time and continued voluntary participation in this study. Patient has been provided direct contact information and is encouraged to contact this Nurse for any needs or questions.  Vickii Penna, RN, BSN, CPN Clinical Research Nurse I [phone]  11/21/2021 3:42 PM

## 2021-11-21 NOTE — Telephone Encounter (Signed)
Called pt, left VM and requested call back.  Need to discuss labs with pt and need her to follow up with OBGYN

## 2021-11-27 ENCOUNTER — Encounter (HOSPITAL_BASED_OUTPATIENT_CLINIC_OR_DEPARTMENT_OTHER)
Admission: RE | Admit: 2021-11-27 | Discharge: 2021-11-27 | Disposition: A | Discharge status: Home / Self Care | Payer: Commercial Managed Care - HMO | Source: Ambulatory Visit | Attending: Surgery | Admitting: Surgery

## 2021-11-27 DIAGNOSIS — E876 Hypokalemia: Secondary | ICD-10-CM | POA: Diagnosis not present

## 2021-11-27 DIAGNOSIS — Z01812 Encounter for preprocedural laboratory examination: Secondary | ICD-10-CM | POA: Diagnosis present

## 2021-11-27 LAB — BASIC METABOLIC PANEL
Anion gap: 12 (ref 5–15)
BUN: 15 mg/dL (ref 6–20)
CO2: 27 mmol/L (ref 22–32)
Calcium: 9.9 mg/dL (ref 8.9–10.3)
Chloride: 103 mmol/L (ref 98–111)
Creatinine, Ser: 0.68 mg/dL (ref 0.44–1.00)
GFR, Estimated: >60 mL/min (ref >60)
Glucose, Bld: 102 mg/dL — ABNORMAL HIGH (ref 70–99)
Potassium: 4 mmol/L (ref 3.5–5.1)
Sodium: 142 mmol/L (ref 135–145)

## 2021-11-28 ENCOUNTER — Encounter: Payer: Self-pay | Admitting: *Deleted

## 2021-11-28 DIAGNOSIS — Z170 Estrogen receptor positive status [ER+]: Secondary | ICD-10-CM

## 2021-11-28 NOTE — Research (Signed)
ACCRU-[place]-2102 - TREATMENT OF ESTABLISHED CHEMOTHERAPY-INDUCED NEUROPATHY WITH N-PALMITOYLETHANOLAMIDE, A CANNABIMIMETIC NUTRACEUTICAL: A RANDOMIZED DOUBLE-BLIND PHASE II PILOT TRIAL   WEEK 7:  Patient was contacted via phone today to complete study week 7 phone call. Identity was confirmed using two patient identifiers.   INVESTIGATIONAL PRODUCT: Patient reports taking 14 capsules since 11/21/21. Patient is always compliant with study medication. Instructed patient to take last dose of study drug next week Thursday 12/04/21. Do not take any more study drug after Thursday and keep leftover study drug to return to research nurse.  She verbalized understanding.  Patient denies use of other cannbinoid products such as CBD and/or THC. Patient denies any new medications since last week.   QUESTIONNAIRE: Patient states she completed the weekly questionnaire today and and she will place it in the mail with the pre paid addressed envelope that was provided.   ADVERSE EFFECTS: Patient denies any new symptoms. She reports some lingering mild nausea since having Rotovirus last week. She says her stomach still feels a little "sensitive"  but overall she is feeling much better.  The rotovirus and related symptoms was not related to study drug.   The patient was thanked for their time and continued voluntary participation in this study. Patient has been provided direct contact information and is encouraged to contact this Nurse for any needs or questions.  Foye Spurling, BSN, RN, [place] Nurse II 11/28/2021 3:51 PM

## 2021-12-02 ENCOUNTER — Encounter (HOSPITAL_BASED_OUTPATIENT_CLINIC_OR_DEPARTMENT_OTHER): Payer: Self-pay | Admitting: Surgery

## 2021-12-02 ENCOUNTER — Ambulatory Visit (HOSPITAL_BASED_OUTPATIENT_CLINIC_OR_DEPARTMENT_OTHER): Payer: Commercial Managed Care - HMO | Admitting: Certified Registered"

## 2021-12-02 ENCOUNTER — Ambulatory Visit (HOSPITAL_BASED_OUTPATIENT_CLINIC_OR_DEPARTMENT_OTHER)
Admission: RE | Admit: 2021-12-02 | Discharge: 2021-12-02 | Disposition: A | Discharge status: Home / Self Care | Payer: Commercial Managed Care - HMO | Attending: Surgery | Admitting: Surgery

## 2021-12-02 ENCOUNTER — Other Ambulatory Visit: Payer: Self-pay

## 2021-12-02 ENCOUNTER — Encounter (HOSPITAL_BASED_OUTPATIENT_CLINIC_OR_DEPARTMENT_OTHER)
Admission: RE | Disposition: A | Discharge status: Home / Self Care | Payer: Self-pay | Source: Home / Self Care | Attending: Surgery

## 2021-12-02 DIAGNOSIS — M797 Fibromyalgia: Secondary | ICD-10-CM | POA: Diagnosis not present

## 2021-12-02 DIAGNOSIS — Z853 Personal history of malignant neoplasm of breast: Secondary | ICD-10-CM | POA: Insufficient documentation

## 2021-12-02 DIAGNOSIS — Z452 Encounter for adjustment and management of vascular access device: Secondary | ICD-10-CM | POA: Insufficient documentation

## 2021-12-02 DIAGNOSIS — Z9221 Personal history of antineoplastic chemotherapy: Secondary | ICD-10-CM | POA: Insufficient documentation

## 2021-12-02 DIAGNOSIS — F418 Other specified anxiety disorders: Secondary | ICD-10-CM

## 2021-12-02 DIAGNOSIS — F32A Depression, unspecified: Secondary | ICD-10-CM | POA: Diagnosis not present

## 2021-12-02 DIAGNOSIS — I10 Essential (primary) hypertension: Secondary | ICD-10-CM | POA: Diagnosis not present

## 2021-12-02 DIAGNOSIS — F419 Anxiety disorder, unspecified: Secondary | ICD-10-CM | POA: Diagnosis not present

## 2021-12-02 DIAGNOSIS — I110 Hypertensive heart disease with heart failure: Secondary | ICD-10-CM | POA: Diagnosis not present

## 2021-12-02 DIAGNOSIS — Z8589 Personal history of malignant neoplasm of other organs and systems: Secondary | ICD-10-CM | POA: Diagnosis not present

## 2021-12-02 DIAGNOSIS — E876 Hypokalemia: Secondary | ICD-10-CM

## 2021-12-02 HISTORY — PX: PORT-A-CATH REMOVAL: SHX5289

## 2021-12-02 LAB — NO BLOOD PRODUCTS

## 2021-12-02 LAB — POCT PREGNANCY, URINE: Preg Test, Ur: NEGATIVE

## 2021-12-02 SURGERY — REMOVAL PORT-A-CATH
Anesthesia: Monitor Anesthesia Care | Site: Chest | Laterality: Right

## 2021-12-02 MED ORDER — PROPOFOL 10 MG/ML IV BOLUS
INTRAVENOUS | Status: AC
  Filled 2021-12-02: qty 20

## 2021-12-02 MED ORDER — CHLORHEXIDINE GLUCONATE CLOTH 2 % EX PADS
6.0000 | MEDICATED_PAD | Freq: Once | CUTANEOUS | Status: AC
  Administered 2021-12-02: 6 via TOPICAL

## 2021-12-02 MED ORDER — ONDANSETRON HCL 4 MG/2ML IJ SOLN
INTRAMUSCULAR | Status: DC | PRN
  Administered 2021-12-02: 4 mg via INTRAVENOUS

## 2021-12-02 MED ORDER — MIDAZOLAM HCL 2 MG/2ML IJ SOLN
INTRAMUSCULAR | Status: AC
  Filled 2021-12-02: qty 2

## 2021-12-02 MED ORDER — ACETAMINOPHEN 500 MG PO TABS
ORAL_TABLET | ORAL | Status: AC
  Filled 2021-12-02: qty 2

## 2021-12-02 MED ORDER — LIDOCAINE 2% (20 MG/ML) 5 ML SYRINGE
INTRAMUSCULAR | Status: DC | PRN
  Administered 2021-12-02: 50 mg via INTRAVENOUS

## 2021-12-02 MED ORDER — ONDANSETRON HCL 4 MG/2ML IJ SOLN
INTRAMUSCULAR | Status: AC
  Filled 2021-12-02: qty 2

## 2021-12-02 MED ORDER — LIDOCAINE 2% (20 MG/ML) 5 ML SYRINGE
INTRAMUSCULAR | Status: AC
  Filled 2021-12-02: qty 5

## 2021-12-02 MED ORDER — OXYCODONE HCL 5 MG PO TABS: 5.0000 mg | ORAL_TABLET | Freq: Once | ORAL | Status: DC | PRN

## 2021-12-02 MED ORDER — MIDAZOLAM HCL 2 MG/2ML IJ SOLN
INTRAMUSCULAR | Status: DC | PRN
  Administered 2021-12-02: 2 mg via INTRAVENOUS

## 2021-12-02 MED ORDER — PROPOFOL 500 MG/50ML IV EMUL
INTRAVENOUS | Status: DC | PRN
  Administered 2021-12-02: 150 ug/kg/min via INTRAVENOUS

## 2021-12-02 MED ORDER — ACETAMINOPHEN 500 MG PO TABS
1000.0000 mg | ORAL_TABLET | ORAL | Status: AC
  Administered 2021-12-02: 1000 mg via ORAL

## 2021-12-02 MED ORDER — CHLORHEXIDINE GLUCONATE CLOTH 2 % EX PADS: 6.0000 | MEDICATED_PAD | Freq: Once | CUTANEOUS | Status: DC

## 2021-12-02 MED ORDER — KETOROLAC TROMETHAMINE 30 MG/ML IJ SOLN: 30.0000 mg | Freq: Once | INTRAMUSCULAR | Status: DC | PRN

## 2021-12-02 MED ORDER — PROPOFOL 500 MG/50ML IV EMUL
INTRAVENOUS | Status: AC
  Filled 2021-12-02: qty 50

## 2021-12-02 MED ORDER — FENTANYL CITRATE (PF) 100 MCG/2ML IJ SOLN
INTRAMUSCULAR | Status: DC | PRN
  Administered 2021-12-02: 25 ug via INTRAVENOUS
  Administered 2021-12-02: 50 ug via INTRAVENOUS
  Administered 2021-12-02: 25 ug via INTRAVENOUS

## 2021-12-02 MED ORDER — BUPIVACAINE-EPINEPHRINE 0.25% -1:200000 IJ SOLN
INTRAMUSCULAR | Status: DC | PRN
  Administered 2021-12-02: 10 mL

## 2021-12-02 MED ORDER — DEXAMETHASONE SODIUM PHOSPHATE 10 MG/ML IJ SOLN
INTRAMUSCULAR | Status: DC | PRN
  Administered 2021-12-02: 5 mg via INTRAVENOUS

## 2021-12-02 MED ORDER — FENTANYL CITRATE (PF) 100 MCG/2ML IJ SOLN
INTRAMUSCULAR | Status: AC
  Filled 2021-12-02: qty 2

## 2021-12-02 MED ORDER — OXYCODONE HCL 5 MG/5ML PO SOLN: 5.0000 mg | Freq: Once | ORAL | Status: DC | PRN

## 2021-12-02 MED ORDER — LACTATED RINGERS IV SOLN: INTRAVENOUS | Status: DC

## 2021-12-02 MED ORDER — AMISULPRIDE (ANTIEMETIC) 5 MG/2ML IV SOLN: 10.0000 mg | Freq: Once | INTRAVENOUS | Status: DC | PRN

## 2021-12-02 MED ORDER — FENTANYL CITRATE (PF) 100 MCG/2ML IJ SOLN: 25.0000 ug | INTRAMUSCULAR | Status: DC | PRN

## 2021-12-02 SURGICAL SUPPLY — 45 items
APL PRP STRL LF DISP 70% ISPRP (MISCELLANEOUS) ×1
APL SKNCLS STERI-STRIP NONHPOA (GAUZE/BANDAGES/DRESSINGS) ×1
APL SWBSTK 6 STRL LF DISP (MISCELLANEOUS)
APPLICATOR COTTON TIP 6 STRL (MISCELLANEOUS) IMPLANT
APPLICATOR COTTON TIP 6IN STRL (MISCELLANEOUS)
BENZOIN TINCTURE PRP APPL 2/3 (GAUZE/BANDAGES/DRESSINGS) ×2 IMPLANT
BLADE HEX COATED 2.75 (ELECTRODE) IMPLANT
BLADE SURG 15 STRL LF DISP TIS (BLADE) ×1 IMPLANT
BLADE SURG 15 STRL SS (BLADE) ×2
CANISTER SUCT 1200ML W/VALVE (MISCELLANEOUS) IMPLANT
CHLORAPREP W/TINT 26 (MISCELLANEOUS) ×2 IMPLANT
COVER BACK TABLE 60X90IN (DRAPES) ×2 IMPLANT
COVER MAYO STAND STRL (DRAPES) ×2 IMPLANT
DRAPE LAPAROTOMY 100X72 PEDS (DRAPES) ×2 IMPLANT
DRAPE UTILITY XL STRL (DRAPES) ×2 IMPLANT
DRSG TEGADERM 2-3/8X2-3/4 SM (GAUZE/BANDAGES/DRESSINGS) ×1 IMPLANT
DRSG TEGADERM 4X4.75 (GAUZE/BANDAGES/DRESSINGS) IMPLANT
ELECT REM PT RETURN 9FT ADLT (ELECTROSURGICAL) ×2
ELECTRODE REM PT RTRN 9FT ADLT (ELECTROSURGICAL) ×1 IMPLANT
GAUZE SPONGE 4X4 12PLY STRL LF (GAUZE/BANDAGES/DRESSINGS) IMPLANT
GLOVE BIO SURGEON STRL SZ7 (GLOVE) ×2 IMPLANT
GLOVE BIOGEL PI IND STRL 7.0 (GLOVE) IMPLANT
GLOVE BIOGEL PI IND STRL 7.5 (GLOVE) ×1 IMPLANT
GLOVE BIOGEL PI INDICATOR 7.0 (GLOVE) ×2
GLOVE BIOGEL PI INDICATOR 7.5 (GLOVE) ×1
GLOVE SURG SS PI 6.5 STRL IVOR (GLOVE) ×1 IMPLANT
GOWN STRL REUS W/ TWL LRG LVL3 (GOWN DISPOSABLE) ×2 IMPLANT
GOWN STRL REUS W/TWL LRG LVL3 (GOWN DISPOSABLE) ×6
NDL HYPO 25X1 1.5 SAFETY (NEEDLE) ×1 IMPLANT
NEEDLE HYPO 25X1 1.5 SAFETY (NEEDLE) ×2 IMPLANT
NS IRRIG 1000ML POUR BTL (IV SOLUTION) IMPLANT
PACK BASIN DAY SURGERY FS (CUSTOM PROCEDURE TRAY) ×2 IMPLANT
PENCIL SMOKE EVACUATOR (MISCELLANEOUS) ×2 IMPLANT
SPIKE FLUID TRANSFER (MISCELLANEOUS) IMPLANT
SPONGE GAUZE 2X2 8PLY STRL LF (GAUZE/BANDAGES/DRESSINGS) ×1 IMPLANT
SPONGE T-LAP 4X18 ~~LOC~~+RFID (SPONGE) ×2 IMPLANT
STRIP CLOSURE SKIN 1/2X4 (GAUZE/BANDAGES/DRESSINGS) ×2 IMPLANT
SUT MON AB 4-0 PC3 18 (SUTURE) ×2 IMPLANT
SUT VIC AB 3-0 SH 27 (SUTURE) ×2
SUT VIC AB 3-0 SH 27X BRD (SUTURE) ×1 IMPLANT
SYR BULB EAR ULCER 3OZ GRN STR (SYRINGE) IMPLANT
SYR CONTROL 10ML LL (SYRINGE) ×2 IMPLANT
TOWEL GREEN STERILE FF (TOWEL DISPOSABLE) ×4 IMPLANT
TUBE CONNECTING 20X1/4 (TUBING) IMPLANT
YANKAUER SUCT BULB TIP NO VENT (SUCTIONS) IMPLANT

## 2021-12-02 NOTE — H&P (Signed)
Tammie [name] is an 46 y.o. female.   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  She underwent chemotherapy as well as lumpectomy with targeted axillary lymph node dissection.  She has completed chemotherapy.  She presents now for port removal.  Past Medical History:  Diagnosis Date   Anxiety    Arrhythmia    palpitation   Breast cancer ([place])    Depression    Family history of breast cancer    Family history of leukemia    Family history of prostate cancer    Family history of stomach cancer    Fibroid, uterine    Fibromyalgia    History of radiation therapy    Left breast- 04/15/21-05/30/21- Dr. Gery Pray   Hx of degenerative disc disease    Hypertension    Interstitial cystitis    Microadenoma    Migraines    PONV (postoperative nausea and vomiting)    Pre-diabetes     Past Surgical History:  Procedure Laterality Date   BREAST LUMPECTOMY Left 03/12/2021   BREAST LUMPECTOMY WITH RADIOACTIVE SEED AND SENTINEL LYMPH NODE BIOPSY Left 03/12/2021   Procedure: LEFT BREAST LUMPECTOMY WITH RADIOACTIVE SEED X2 AND LEFT SENTINEL LYMPH NODE BIOPSY;  Surgeon: Donnie Mesa, MD;  Location: [facility];  Service: General;  Laterality: Left;   CHOLECYSTECTOMY     PORTACATH PLACEMENT Right 10/17/2020   Procedure: INSERTION PORT-A-CATH, ULTRASOUND GUIDED;  Surgeon: Donnie Mesa, MD;  Location: [facility];  Service: General;  Laterality: Right;   RADIOACTIVE SEED GUIDED AXILLARY SENTINEL LYMPH NODE Left 03/12/2021   Procedure: RADIOACTIVE SEED GUIDED LEFT AXILLARY SENTINEL LYMPH NODE BIOPSY;  Surgeon: Donnie Mesa, MD;  Location: [facility];  Service: General;  Laterality: Left;    Family History  Problem Relation Age of Onset   Breast cancer Mother 15   Prostate cancer Father 34   Stomach cancer Paternal  Grandmother        dx older than 69   Leukemia Paternal Grandfather 54       dx older than 33   Breast cancer Paternal Aunt    Breast cancer Other        mother's first cousin   Social History:  reports that she has never smoked. She has never used smokeless tobacco. She reports that she does not drink alcohol and does not use drugs.  Allergies: No Known Allergies  Medications Prior to Admission  Medication Sig Dispense Refill   anastrozole (ARIMIDEX) 1 MG tablet Take 1 tablet (1 mg total) by mouth daily. 30 tablet 11   ascorbic acid (VITAMIN C) 1000 MG tablet Take 1,000 mg by mouth daily.     BEE POLLEN PO Take 500 mg by mouth 2 (two) times daily. 1 in the morning and 2 at night     Biotin 1 MG CAPS Take 1 capsule by mouth daily. (Patient taking differently: Take 5,000 mcg by mouth daily.) 30 capsule    Cholecalciferol (DIALYVITE VITAMIN D 5000) 125 MCG (5000 UT) capsule Take 5,000 Units by mouth daily.     Coral Calcium 1000 (390 Ca) MG TABS Take 1 capsule by mouth 2 (two) times daily.     Investigational palmitoylethanolamide/placebo 400 MG capsule ACCRU-[facility]-2102 Take 1 capsule by mouth twice daily for 8 weeks. One capsule (400 mg) in the morning and one capsule (400 mg) in  the evening, take with food and swallow whole. Do not crush or open capsule. Store at room temperature. Keep container tightly closed. 120 capsule 0   lidocaine-prilocaine (EMLA) cream Apply 1 application topically as needed. 30 g 0   Magnesium 500 MG CAPS Take 1 capsule (500 mg total) by mouth daily.  0   metoprolol tartrate (LOPRESSOR) 25 MG tablet Take 25 mg by mouth daily.     NON FORMULARY Take 250 mg by mouth daily. Hyaluronic Acid Complex     Omega-3 Fatty Acids (OMEGA 3 500 PO) Take 500 mg by mouth daily.     OVER THE COUNTER MEDICATION Take 2 capsules by mouth 2 (two) times daily. Laminine supplement- 620 mg     potassium chloride SA (KLOR-CON M) 20 MEQ tablet Take 1 tablet (20 mEq total) by mouth 2 (two)  times daily for 7 days. 14 tablet 0   venlafaxine XR (EFFEXOR-XR) 37.5 MG 24 hr capsule Take 1 capsule (37.5 mg total) by mouth daily with breakfast. 30 capsule 6   ibuprofen (ADVIL) 800 MG tablet Take 800 mg by mouth 2 (two) times daily as needed. (Patient not taking: Reported on 10/28/2021)     ondansetron (ZOFRAN) 8 MG tablet Take 1 tablet (8 mg total) by mouth every 8 (eight) hours as needed for nausea. (Patient not taking: Reported on 10/28/2021) 30 tablet 3    Results for orders placed or performed during the hospital encounter of 12/02/21 (from the past 48 hour(s))  Pregnancy, urine POC     Status: None   Collection Time: 12/02/21  7:24 AM  Result Value Ref Range   Preg Test, Ur NEGATIVE NEGATIVE    Comment:        THE SENSITIVITY OF THIS METHODOLOGY IS >24 mIU/mL   No blood products     Status: None   Collection Time: 12/02/21  7:35 AM  Result Value Ref Range   Transfuse no blood products      TRANSFUSE NO BLOOD PRODUCTS, VERIFIED BY Tish Frederickson, RN Performed at [facility], [address] [place], [place] [address]    Blood pressure 112/72, pulse 72, temperature 97.9 F (36.6 C), temperature source Oral, resp. rate 18, height '5\' 3"'$  (1.6 m), weight 69.2 kg, last menstrual period 10/29/2021, SpO2 100 %. Physical Exam  Constitutional:  WDWN in NAD, conversant, no obvious deformities; lying in bed comfortably Eyes:  Pupils equal, round; sclera anicteric; moist conjunctiva; no lid lag HENT:  Oral mucosa moist; good dentition  Neck:  No masses palpated, trachea midline; no thyromegaly Lungs:  CTA bilaterally; normal respiratory effort Breasts:  symmetric, well-healed left breast incision ,no nipple changes; no palpable masses or lymphadenopathy on either side Port site - c/d/i CV:  Regular rate and rhythm; no murmurs; extremities well-perfused with no edema Abd:  +bowel sounds, soft, non-tender, no palpable organomegaly; no palpable hernias Musc:  Unable to assess gait; no  apparent clubbing or cyanosis in extremities Lymphatic:  No palpable cervical or axillary lymphadenopathy Skin:  Warm, dry; no sign of jaundice Psychiatric - alert and oriented x 4; calm mood and affect Assessment/Pla Plan port removal.  The surgical procedure has been discussed with the patient.  Potential risks, benefits, alternative treatments, and expected outcomes have been explained.  All of the patient's questions at this time have been answered.  The likelihood of reaching the patient's treatment goal is good.  The patient understand the proposed surgical procedure and wishes to proceed.   Rodman Key  Oren Section, MD 12/02/2021, 8:16 AM

## 2021-12-02 NOTE — Transfer of Care (Signed)
Immediate Anesthesia Transfer of Care Note  Patient: Tammie [name]  Procedure(s) Performed: REMOVAL PORT-A-CATH (Right: Chest)  Patient Location: PACU  Anesthesia Type:MAC  Level of Consciousness: drowsy  Airway & Oxygen Therapy: Patient Spontanous Breathing and Patient connected to face mask oxygen  Post-op Assessment: Report given to RN and Post -op Vital signs reviewed and stable  Post vital signs: Reviewed and stable  Last Vitals:  Vitals Value Taken Time  BP 98/57 12/02/21 0906  Temp    Pulse 76 12/02/21 0907  Resp 17 12/02/21 0907  SpO2 98 % 12/02/21 0907  Vitals shown include unvalidated device data.  Last Pain:  Vitals:   12/02/21 0732  TempSrc: Oral         Complications: No notable events documented.

## 2021-12-02 NOTE — Discharge Instructions (Addendum)
[facility] Office Phone Number [phone] POST OP INSTRUCTIONS  Always review your discharge instruction sheet given to you by the facility where your surgery was performed.  IF YOU HAVE DISABILITY OR FAMILY LEAVE FORMS, YOU MUST BRING THEM TO THE OFFICE FOR PROCESSING.  DO NOT GIVE THEM TO YOUR DOCTOR.  A prescription for pain medication may be given to you upon discharge.  Take your pain medication as prescribed, if needed.  If narcotic pain medicine is not needed, then you may take acetaminophen (Tylenol) or ibuprofen (Advil) as needed. Take your usually prescribed medications unless otherwise directed You should eat very light the first 24 hours after surgery, such as soup, crackers, pudding, etc.  Resume your normal diet the day after surgery. Most patients will experience some swelling and bruising around the surgical site.  Ice packs will help.  Swelling and bruising can take several days to resolve.  You may remove your bandages 48 hours after surgery, and you may shower at that time.  You will have steri-strips (small skin tapes) in place directly over the incision.  These strips should be left on the skin for 7-10 days.   ACTIVITIES:  You may resume regular daily activities (gradually increasing) beginning the next day.   You may have sexual intercourse when it is comfortable. You may drive when you no longer are taking prescription pain medication, you can comfortably wear a seatbelt, and you can safely maneuver your car and apply brakes. RETURN TO WORK:  1--2 days WHEN TO CALL YOUR DOCTOR: Fever over 101.0 Nausea and/or vomiting. Extreme swelling or bruising. Continued bleeding from incision. Increased pain, redness, or drainage from the incision.  The clinic staff is available to answer your questions during regular business hours.  Please don't hesitate to call and ask to speak to one of the nurses for clinical concerns.  If you have a medical emergency, go to  the nearest emergency room or call 911.  A surgeon from [facility] is always on call at the hospital.  For further questions, please visit [email or web address]    No Tylenol before 1:45pm if needed.  Post Anesthesia Home Care Instructions  Activity: Get plenty of rest for the remainder of the day. A responsible individual must stay with you for 24 hours following the procedure.  For the next 24 hours, DO NOT: -Drive a car -Paediatric nurse -Drink alcoholic beverages -Take any medication unless instructed by your physician -Make any legal decisions or sign important papers.  Meals: Start with liquid foods such as gelatin or soup. Progress to regular foods as tolerated. Avoid greasy, spicy, heavy foods. If nausea and/or vomiting occur, drink only clear liquids until the nausea and/or vomiting subsides. Call your physician if vomiting continues.  Special Instructions/Symptoms: Your throat may feel dry or sore from the anesthesia or the breathing tube placed in your throat during surgery. If this causes discomfort, gargle with warm salt water. The discomfort should disappear within 24 hours.  If you had a scopolamine patch placed behind your ear for the management of post- operative nausea and/or vomiting:  1. The medication in the patch is effective for 72 hours, after which it should be removed.  Wrap patch in a tissue and discard in the trash. Wash hands thoroughly with soap and water. 2. You may remove the patch earlier than 72 hours if you experience unpleasant side effects which may include dry mouth, dizziness or visual disturbances. 3. Avoid touching the patch. Wash your hands  with soap and water after contact with the patch.

## 2021-12-02 NOTE — Anesthesia Preprocedure Evaluation (Addendum)
Anesthesia Evaluation  Patient identified by MRN, date of birth, ID band Patient awake    Reviewed: Allergy & Precautions, NPO status , Patient's Chart, lab work & pertinent test results  Airway Mallampati: I  TM Distance: >3 FB Neck ROM: Full    Dental no notable dental hx.    Pulmonary neg pulmonary ROS,    Pulmonary exam normal        Cardiovascular hypertension, Normal cardiovascular exam     Neuro/Psych  Headaches, PSYCHIATRIC DISORDERS Anxiety Depression  Neuromuscular disease    GI/Hepatic negative GI ROS, Neg liver ROS,   Endo/Other  negative endocrine ROS  Renal/GU negative Renal ROS     Musculoskeletal  (+) Fibromyalgia -  Abdominal   Peds  Hematology  (+) REFUSES BLOOD PRODUCTS,   Anesthesia Other Findings BREAST CANCER  Reproductive/Obstetrics hcg negative                           Anesthesia Physical Anesthesia Plan  ASA: 2  Anesthesia Plan: MAC   Post-op Pain Management:    Induction: Intravenous  PONV Risk Score and Plan: 2 and Ondansetron, Dexamethasone, Propofol infusion, Midazolam and Treatment may vary due to age or medical condition  Airway Management Planned: Simple Face Mask  Additional Equipment:   Intra-op Plan:   Post-operative Plan:   Informed Consent: I have reviewed the patients History and Physical, chart, labs and discussed the procedure including the risks, benefits and alternatives for the proposed anesthesia with the patient or authorized representative who has indicated his/her understanding and acceptance.     Dental advisory given  Plan Discussed with: CRNA  Anesthesia Plan Comments:        Anesthesia Quick Evaluation

## 2021-12-02 NOTE — Op Note (Signed)
Preop diagnosis: Personal history of breast cancer Postop diagnosis: Same Procedure performed: Port removal Surgeon:[name] Anesthesia: Local MAC Indications: This is a 46 year old female who is status post neoadjuvant chemotherapy as well as lumpectomy with targeted lymph node dissection for breast cancer.  She has completed chemotherapy.  Presents now for port removal.  Description of procedure: The patient is brought to the operating room placed in the supine position on the operating room table.  After an adequate level of intravenous sedation was given, her right chest was prepped with ChloraPrep and draped sterile fashion.  A timeout was taken to ensure the proper patient and proper procedure.  We infiltrated the area of the port with local anesthetic.  I opened her old incision.  We dissected down to the port with cautery.  The 2 Prolene stay sutures were removed.  The port was removed.  Direct pressure was held at the insertion site for several minutes.  We excised most of the capsule around the port.  Once we are satisfied with hemostasis, we closed the wound with 3-0 Vicryl 4-0 Monocryl.  Benzoin and Steri-Strips were applied.  She was awakened and brought to the recovery room in stable condition.  All sponge, instrument, and needle counts are correct.  Tammie [name]. Georgette Dover, MD, [facility]  General Surgery   12/02/2021 9:04 AM

## 2021-12-02 NOTE — Anesthesia Postprocedure Evaluation (Signed)
Anesthesia Post Note  Patient: Tammie [name]  Procedure(s) Performed: REMOVAL PORT-A-CATH (Right: Chest)     Patient location during evaluation: PACU Anesthesia Type: MAC Level of consciousness: awake Pain management: pain level controlled Vital Signs Assessment: post-procedure vital signs reviewed and stable Respiratory status: spontaneous breathing, nonlabored ventilation, respiratory function stable and patient connected to nasal cannula oxygen Cardiovascular status: stable and blood pressure returned to baseline Postop Assessment: no apparent nausea or vomiting Anesthetic complications: no   No notable events documented.  Last Vitals:  Vitals:   12/02/21 0945 12/02/21 1008  BP: 97/62 (!) 103/54  Pulse: (!) 57 67  Resp: 16 20  Temp:  36.4 C  SpO2: 98% 98%    Last Pain:  Vitals:   12/02/21 1008  TempSrc: Oral  PainSc: 0-No pain                 Renella Steig P Shermar Friedland

## 2021-12-03 ENCOUNTER — Encounter (HOSPITAL_BASED_OUTPATIENT_CLINIC_OR_DEPARTMENT_OTHER): Payer: Self-pay | Admitting: Surgery

## 2021-12-04 ENCOUNTER — Other Ambulatory Visit: Payer: Self-pay | Admitting: Nurse Practitioner

## 2021-12-05 ENCOUNTER — Encounter: Payer: Self-pay | Admitting: *Deleted

## 2021-12-05 DIAGNOSIS — C50512 Malignant neoplasm of lower-outer quadrant of left female breast: Secondary | ICD-10-CM

## 2021-12-05 NOTE — Research (Signed)
ACCRU-[place]-2102 - TREATMENT OF ESTABLISHED CHEMOTHERAPY-INDUCED NEUROPATHY WITH N-PALMITOYLETHANOLAMIDE, A CANNABIMIMETIC NUTRACEUTICAL: A RANDOMIZED DOUBLE-BLIND PHASE II PILOT TRIAL   WEEK 8:  Patient was contacted via phone today to complete study week 8 phone call. Identity was confirmed using two patient identifiers.   INVESTIGATIONAL PRODUCT: Patient reports taking 13 capsules since 11/28/21. Patient is always compliant with study medication. Patient reports she took last dose of study drug yesterday 12/04/21.   Patient denies use of other cannbinoid products such as CBD and/or THC. Patient denies any new medications since last week.   QUESTIONNAIRE: Patient states she completed the weekly questionnaire today and and she will place it in the mail with the pre paid addressed envelope that was provided.   ADVERSE EFFECTS: Patient denies any new symptoms. She denies nausea, vomiting or diarrhea. She feels she has recovered from the Rotovirus.   PLAN: Patient states she may have time next week on Thursday to bring back the remaining study drug. She will call when she is planning on dropping it off.  Patient would like to be unblinded and know what study drug she was taking to help decide if she wants to try any other treatment. Dr. Pamelia Hoit agrees patient should be unblinded. Informed patient that once we receive the week 8 questionnaires and left over study drug, we can request the study to unblind. Patient verbalized understanding.    The patient was thanked for their time and continued voluntary participation in this study. Patient has been provided direct contact information and is encouraged to contact this Nurse for any needs or questions.  Domenica Reamer, BSN, RN, [employer] Research Nurse II 12/05/2021 4:45 PM

## 2021-12-15 ENCOUNTER — Encounter: Payer: Self-pay | Admitting: Hematology and Oncology

## 2021-12-17 ENCOUNTER — Encounter: Payer: Self-pay | Admitting: Hematology and Oncology

## 2021-12-17 ENCOUNTER — Telehealth: Payer: Self-pay | Admitting: *Deleted

## 2021-12-17 NOTE — Progress Notes (Signed)
Patient Care Team: [name], FNP as PCP - General (Family Medicine) Donnie Mesa, MD as Consulting Physician (General Surgery) Nicholas Lose, MD as Consulting Physician (Hematology and Oncology) Gery Pray, MD as Consulting Physician (Radiation Oncology)  DIAGNOSIS:  Encounter Diagnosis  Name Primary?   Malignant neoplasm of lower-outer quadrant of left breast of female, estrogen receptor positive ([place])     SUMMARY OF ONCOLOGIC HISTORY: Oncology History  Malignant neoplasm of lower-outer quadrant of left breast of female, estrogen receptor positive ([place])  10/01/2020 Initial Diagnosis   Screening mammogram showed a left breast mass and calcifications. Diagnostic mammogram and US showed a 1.7cm and 0.5cm mass at the 5 o'clock position in the left breast, with one 0.4cm abnormal right axillary lymph node. 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   10/09/2020 Cancer Staging   Staging form: Breast, AJCC 8th Edition - Clinical stage from 10/09/2020: Stage IB (cT2, cN1, cM0, G3, ER+, PR+, HER2+) - Signed by Nicholas Lose, MD on 10/09/2020 Stage prefix: Initial diagnosis Histologic grading system: 3 grade system Percentage of positive estrogen receptors (%): 5 Percentage of positive progesterone receptors (%): 1 Ki-67 (%): 20   10/18/2020 - 02/27/2021 Chemotherapy   Patient is on Treatment Plan : BREAST  Docetaxel + Carboplatin + Trastuzumab + Pertuzumab  (TCHP) q21d      10/23/2020 Genetic Testing   Negative genetic testing:  No pathogenic variants detected on the [employer] CancerNext-Expanded + RNAinsight panel. The report date is 10/23/2020.   The CancerNext-Expanded + RNAinsight gene panel offered by [employer] and includes sequencing and rearrangement analysis for the following 77 genes: AIP, ALK, APC, ATM, AXIN2, BAP1, BARD1, BLM, BMPR1A, BRCA1, BRCA2, BRIP1, CDC73, CDH1, CDK4, CDKN1B, CDKN2A, CHEK2, CTNNA1,  DICER1, FANCC, FH, FLCN, GALNT12, KIF1B, LZTR1, MAX, MEN1, MET, MLH1, MSH2, MSH3, MSH6, MUTYH, NBN, NF1, NF2, NTHL1, PALB2, PHOX2B, PMS2, POT1, PRKAR1A, PTCH1, PTEN, RAD51C, RAD51D, RB1, RECQL, RET, SDHA, SDHAF2, SDHB, SDHC, SDHD, SMAD4, SMARCA4, SMARCB1, SMARCE1, STK11, SUFU, TMEM127, TP53, TSC1, TSC2, VHL and XRCC2 (sequencing and deletion/duplication); EGFR, EGLN1, HOXB13, KIT, MITF, PDGFRA, POLD1 and POLE (sequencing only); EPCAM and GREM1 (deletion/duplication only). RNA data is routinely analyzed for use in variant interpretation for all genes.   03/31/2021 - 10/28/2021 Chemotherapy   Patient is on Treatment Plan : BREAST Trastuzumab  + Pertuzumab q21d x 13 cycles     04/15/2021 - 05/30/2021 Radiation Therapy   Site Technique Total Dose (Gy) Dose per Fx (Gy) Completed Fx Beam Energies  Breast, Left: Breast_Lt 3D 50.4/50.4 1.8 28/28 6X  Axilla, Left: Axilla_Lt 3D 50.4/50.4 1.8 28/28 6X, 10X       CHIEF COMPLIANT: Follow-up on Anastrozole     INTERVAL HISTORY: Tammie [name] is a 46 y.o. with above-mentioned history of left breast cancer who completed neoadjuvant chemotherapy. She presents to the clinic today for a follow-up. States that she has some mild hot flashes. States that she has arthritis but denies joint stiffness. Complains of numbness in the tips of fingers. She is walking and getting time for exercise.     ALLERGIES:  has No Known Allergies.  MEDICATIONS:  Current Outpatient Medications  Medication Sig Dispense Refill   ascorbic acid (VITAMIN C) 1000 MG tablet Take 1,000 mg by mouth daily.     BEE POLLEN PO Take 500 mg by mouth 2 (two) times daily. 1 in the morning and 2 at night     Cholecalciferol (  DIALYVITE VITAMIN D 5000) 125 MCG (5000 UT) capsule Take 5,000 Units by mouth daily.     cloNIDine (CATAPRES) 0.1 MG tablet Take 0.1 mg by mouth at bedtime.     Coral Calcium 1000 (390 Ca) MG TABS Take 1 capsule by mouth 2 (two) times daily.     ibuprofen (ADVIL) 800 MG  tablet Take 800 mg by mouth 2 (two) times daily as needed.     Magnesium 500 MG CAPS Take 1 capsule (500 mg total) by mouth daily.  0   metoprolol tartrate (LOPRESSOR) 25 MG tablet Take 25 mg by mouth daily.     NON FORMULARY Take 250 mg by mouth daily. Hyaluronic Acid Complex     Omega-3 Fatty Acids (OMEGA 3 500 PO) Take 500 mg by mouth daily.     OVER THE COUNTER MEDICATION Take 2 capsules by mouth 2 (two) times daily. Laminine supplement- 620 mg     venlafaxine XR (EFFEXOR-XR) 37.5 MG 24 hr capsule Take 1 capsule (37.5 mg total) by mouth daily with breakfast. 30 capsule 6   anastrozole (ARIMIDEX) 1 MG tablet Take 1 tablet (1 mg total) by mouth daily. 90 tablet 3   ondansetron (ZOFRAN) 8 MG tablet Take 1 tablet (8 mg total) by mouth every 8 (eight) hours as needed for nausea. (Patient not taking: Reported on 10/28/2021) 30 tablet 3   No current facility-administered medications for this visit.    PHYSICAL EXAMINATION: ECOG PERFORMANCE STATUS: 1 - Symptomatic but completely ambulatory  Vitals:   12/30/21 0847  BP: 136/82  Pulse: 77  Resp: 18  Temp: (!) 97.2 F (36.2 C)  SpO2: 100%   Filed Weights   12/30/21 0847  Weight: 155 lb 4.8 oz (70.4 kg)      LABORATORY DATA:  I have reviewed the data as listed    Latest Ref Rng & Units 11/27/2021    1:50 PM 11/14/2021    2:03 PM 10/07/2021    9:02 AM  CMP  Glucose 70 - 99 mg/dL 102  106  105   BUN 6 - 20 mg/dL _0 Creatinine 0.44 - 1.00 mg/dL 0.68  0.74  0.77   Sodium 135 - 145 mmol/L 142  138  142   Potassium 3.5 - 5.1 mmol/L 4.0  3.0  3.8   Chloride 98 - 111 mmol/L 103  104  104   CO2 22 - 32 mmol/L _1 Calcium 8.9 - 10.3 mg/dL 9.9  9.1  9.3   Total Protein 6.5 - 8.1 g/dL  7.3  6.7   Total Bilirubin 0.3 - 1.2 mg/dL  0.5  0.2   Alkaline Phos 38 - 126 U/L  56  57   AST 15 - 41 U/L  86  22   ALT 0 - 44 U/L  155  21     Lab Results  Component Value Date   WBC 5.0 12/30/2021   HGB 12.6 12/30/2021   HCT 36.6  12/30/2021   MCV 87.4 12/30/2021   PLT 188 12/30/2021   NEUTROABS 2.8 12/30/2021    ASSESSMENT & PLAN:  Malignant neoplasm of lower-outer quadrant of left breast of female, estrogen receptor positive ([place]) 10/01/2020:Screening mammogram showed a left breast mass and calcifications. Diagnostic mammogram and US showed a 1.7cm and 0.5cm mass at the 5 o'clock position in the left breast, with one 0.4cm abnormal right axillary lymph node. Biopsy showed invasive and in situ ductal  carcinoma, grade 3, HER-2 positive (3+), ER+ 5% weak, PR+ 1%, Ki67 20%.   Treatment Plan: 1. Neoadjuvant chemotherapy with [facility] Perjeta 6 cycles followed by Herceptin Perjeta maintenance for 1 year completed June 2023 2. 03/12/2021: Pathologic complete response, 0/6 lymph nodes negative 3. Followed by adjuvant radiation therapy completed 05/30/2021 4.  Followed by antiestrogen therapy although ER is weak URCC nausea study Breast MRI: 10/17/20: 2.4 cm left breast mass, intramammary lymph node ------------------------------------------------------------------------------------------------------------------------- Severe hot flashes: Menopausal symptoms. On Effexor.  If her hot flashes improve from severe to mild to moderate and we can initiate antiestrogen therapy. Chemo-induced peripheral neuropathy: Participated in clinical trial with PEA: Did not notice any improvement  Current treatment: Anastrozole 1 mg daily started 10/28/2021 Anastrozole toxicities:  Breast cancer surveillance: 1.  Breast exam May 2023: Benign 2. mammogram left breast 07/21/2021: Postoperative seroma 3.2 cm, recommended left axillary ultrasound in 6 months 3.  Bone density 12/22/2021: T score -1.3 (mild osteopenia)  Return to clinic in 1 year for follow-up    No orders of the defined types were placed in this encounter.  The patient has a good understanding of the overall plan. she agrees with it. she will call with any problems that may develop  before the next visit here. Total time spent: 30 mins including face to face time and time spent for planning, charting and co-ordination of care   Harriette Ohara, MD 12/30/21    I Gardiner Coins am scribing for Dr. Lindi Adie  I have reviewed the above documentation for accuracy and completeness, and I agree with the above.

## 2021-12-17 NOTE — Telephone Encounter (Signed)
ACCRU [id number]: Patient called to say she can drop off the remainder of her study drug to [place] on Monday afternoon 12/22/21. She will call research nurse when she is on her way and nurse plans to meet patient out front of clinic in car circle to collect study drug. Thanked patient for calling and arranging to return the study drug.   Foye Spurling, BSN, RN, [employer] Research Nurse II 12/17/2021 2:11 PM

## 2021-12-22 ENCOUNTER — Ambulatory Visit (HOSPITAL_BASED_OUTPATIENT_CLINIC_OR_DEPARTMENT_OTHER)
Admission: RE | Admit: 2021-12-22 | Discharge: 2021-12-22 | Disposition: A | Discharge status: Home / Self Care | Payer: No Typology Code available for payment source | Source: Ambulatory Visit | Attending: Adult Health | Admitting: Adult Health

## 2021-12-22 ENCOUNTER — Encounter: Payer: Self-pay | Admitting: *Deleted

## 2021-12-22 DIAGNOSIS — E2839 Other primary ovarian failure: Secondary | ICD-10-CM | POA: Insufficient documentation

## 2021-12-22 DIAGNOSIS — Z170 Estrogen receptor positive status [ER+]: Secondary | ICD-10-CM

## 2021-12-22 NOTE — Research (Signed)
ACCRU-[place]-2102 - TREATMENT OF ESTABLISHED CHEMOTHERAPY-INDUCED NEUROPATHY WITH N-PALMITOYLETHANOLAMIDE, A CANNABIMIMETIC NUTRACEUTICAL: A RANDOMIZED DOUBLE-BLIND PHASE II PILOT TRIAL   Drug Return:  Patient drove to [place] and this Research nurse met patient outside in car circle to collect her remaining study drug, PEA/Placebo.  Patient returned 2 bottles, one was empty and the other had 24 capsules left.  This was not unexpected. Patient was not sure exactly how many tablets she missed while she was sick with Rotovirus.  Patient reports she threw away the first 2 empty bottles at home because she didn't know she was supposed to return them.  Overall patient reports she did not feel any improvement in her neuropathy symptoms taking the study drug. Patient requests to know if she was taking PEA or Placebo.  Dr. Lindi Adie agrees and unblinding will be requested from the study. Informed patient research nurse will contact her when she has been unblinded. Thanked patient for bringing back the remaining study drug and for her participation. Patient verbalized understanding.  1 empty study drug bottle and 1 with 24 capsules in it was returned to the [place] and given to Raul Del, Pharmacist for recording.  Foye Spurling, BSN, RN, [employer] Research Nurse II 12/22/2021 2:15 PM

## 2021-12-23 ENCOUNTER — Telehealth: Payer: Self-pay

## 2021-12-23 ENCOUNTER — Telehealth: Payer: Self-pay | Admitting: *Deleted

## 2021-12-23 NOTE — Telephone Encounter (Signed)
-----   Message from Gardenia Phlegm, NP sent at 12/23/2021  7:38 AM EDT ----- Overall her bone density looks good.  Only very mild osteopenia.  REcommend repeat in 2 years.  ----- Message ----- From: Interface, Rad Results In Sent: 12/22/2021   2:17 PM EDT To: Gardenia Phlegm, NP

## 2021-12-23 NOTE — Telephone Encounter (Signed)
Called pt to make aware of results. She verbalized understanding and knows to call with any concerns.

## 2021-12-23 NOTE — Telephone Encounter (Signed)
ACCRU [id number] Study:  Patient was unblinded by study and reveals patient was taking PEA 800 mg (400 mg BID).  Informed patient she was taking the PEA and not Placebo.  Patient reports her neuropathy did not improve on study. She will discuss her neuropathy symptoms with Dr. Lindi Adie at her appointment next week. Thanked patient for participating and informed of follow up call in 6 months and one year. She verbalized understanding.  Dr. Lindi Adie informed of unblinded Arm of study.  Foye Spurling, BSN, RN, CCRP Clinical Research Nurse II 12/23/2021 10:44 AM

## 2021-12-23 NOTE — Telephone Encounter (Signed)
error 

## 2021-12-30 ENCOUNTER — Other Ambulatory Visit: Payer: Self-pay

## 2021-12-30 ENCOUNTER — Inpatient Hospital Stay (HOSPITAL_BASED_OUTPATIENT_CLINIC_OR_DEPARTMENT_OTHER): Payer: No Typology Code available for payment source | Admitting: Hematology and Oncology

## 2021-12-30 ENCOUNTER — Other Ambulatory Visit: Payer: Self-pay | Admitting: *Deleted

## 2021-12-30 ENCOUNTER — Inpatient Hospital Stay: Payer: No Typology Code available for payment source | Attending: Hematology and Oncology

## 2021-12-30 DIAGNOSIS — Z923 Personal history of irradiation: Secondary | ICD-10-CM | POA: Insufficient documentation

## 2021-12-30 DIAGNOSIS — Z79811 Long term (current) use of aromatase inhibitors: Secondary | ICD-10-CM | POA: Diagnosis not present

## 2021-12-30 DIAGNOSIS — Z170 Estrogen receptor positive status [ER+]: Secondary | ICD-10-CM

## 2021-12-30 DIAGNOSIS — Z9221 Personal history of antineoplastic chemotherapy: Secondary | ICD-10-CM | POA: Insufficient documentation

## 2021-12-30 DIAGNOSIS — C50512 Malignant neoplasm of lower-outer quadrant of left female breast: Secondary | ICD-10-CM | POA: Diagnosis not present

## 2021-12-30 LAB — CBC WITH DIFFERENTIAL (CANCER CENTER ONLY)
Abs Immature Granulocytes: 0.01 10*3/uL (ref 0.00–0.07)
Basophils Absolute: 0 10*3/uL (ref 0.0–0.1)
Basophils Relative: 0 %
Eosinophils Absolute: 0.1 10*3/uL (ref 0.0–0.5)
Eosinophils Relative: 2 %
HCT: 36.6 % (ref 36.0–46.0)
Hemoglobin: 12.6 g/dL (ref 12.0–15.0)
Immature Granulocytes: 0 %
Lymphocytes Relative: 29 %
Lymphs Abs: 1.4 10*3/uL (ref 0.7–4.0)
MCH: 30.1 pg (ref 26.0–34.0)
MCHC: 34.4 g/dL (ref 30.0–36.0)
MCV: 87.4 fL (ref 80.0–100.0)
Monocytes Absolute: 0.6 10*3/uL (ref 0.1–1.0)
Monocytes Relative: 11 %
Neutro Abs: 2.8 10*3/uL (ref 1.7–7.7)
Neutrophils Relative %: 58 %
Platelet Count: 188 10*3/uL (ref 150–400)
RBC: 4.19 MIL/uL (ref 3.87–5.11)
RDW: 13.9 % (ref 11.5–15.5)
WBC Count: 5 10*3/uL (ref 4.0–10.5)
nRBC: 0 % (ref 0.0–0.2)

## 2021-12-30 LAB — CMP (CANCER CENTER ONLY)
ALT: 30 U/L (ref 0–44)
AST: 21 U/L (ref 15–41)
Albumin: 4.5 g/dL (ref 3.5–5.0)
Alkaline Phosphatase: 65 U/L (ref 38–126)
Anion gap: 7 (ref 5–15)
BUN: 25 mg/dL — ABNORMAL HIGH (ref 6–20)
CO2: 28 mmol/L (ref 22–32)
Calcium: 9.9 mg/dL (ref 8.9–10.3)
Chloride: 104 mmol/L (ref 98–111)
Creatinine: 0.71 mg/dL (ref 0.44–1.00)
GFR, Estimated: >60 mL/min (ref >60)
Glucose, Bld: 85 mg/dL (ref 70–99)
Potassium: 3.7 mmol/L (ref 3.5–5.1)
Sodium: 139 mmol/L (ref 135–145)
Total Bilirubin: 0.2 mg/dL — ABNORMAL LOW (ref 0.3–1.2)
Total Protein: 7.2 g/dL (ref 6.5–8.1)

## 2021-12-30 MED ORDER — ANASTROZOLE 1 MG PO TABS: 1.0000 mg | ORAL_TABLET | Freq: Every day | ORAL | 3 refills | Status: DC

## 2021-12-30 NOTE — Assessment & Plan Note (Signed)
10/01/2020:Screening mammogram showed a left breast mass and calcifications. Diagnostic mammogram and US showed a 1.7cm and 0.5cm mass at the 5 o'clock position in the left breast, with one 0.4cm abnormal right axillary lymph node. 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  Treatment Plan: 1. Neoadjuvant chemotherapy with [facility] Perjeta 6 cycles followed by Herceptin Perjeta maintenance for 1 year completed June 2023 2.03/12/2021: Pathologic complete response, 0/6 lymph nodes negative 3. Followed by adjuvant radiation therapycompleted 05/30/2021 4.Followed by antiestrogen therapy although ER is weak URCC nausea study Breast MRI: 10/17/20:2.4 cm left breast mass, intramammary lymph node ------------------------------------------------------------------------------------------------------------------------- Severe hot flashes: Menopausal symptoms. On Effexor.  If her hot flashes improve from severe to mild to moderate and we can initiate antiestrogen therapy. Chemo-induced peripheral neuropathy: Participated in clinical trial with PEA: Did not notice any improvement  Current treatment: Anastrozole 1 mg daily started 10/28/2021 Anastrozole toxicities:  Breast cancer surveillance: 1.  Breast exam 12/30/2021: Benign 2. mammogram left breast 07/21/2021: Postoperative seroma 3.2 cm, recommended left axillary ultrasound in 6 months 3.  Bone density 12/22/2021: T score -1.3 (mild osteopenia)  Return to clinic in 1 year for follow-up

## 2022-01-20 ENCOUNTER — Ambulatory Visit
Admission: RE | Admit: 2022-01-20 | Discharge: 2022-01-20 | Disposition: A | Discharge status: Home / Self Care | Payer: No Typology Code available for payment source | Source: Ambulatory Visit | Attending: Adult Health | Admitting: Adult Health

## 2022-01-20 ENCOUNTER — Ambulatory Visit
Admission: RE | Admit: 2022-01-20 | Discharge: 2022-01-20 | Disposition: A | Discharge status: Home / Self Care | Payer: Self-pay | Source: Ambulatory Visit | Attending: Hematology and Oncology | Admitting: Hematology and Oncology

## 2022-01-20 DIAGNOSIS — Z170 Estrogen receptor positive status [ER+]: Secondary | ICD-10-CM

## 2022-01-26 ENCOUNTER — Ambulatory Visit: Payer: No Typology Code available for payment source | Attending: Surgery

## 2022-01-26 VITALS — Wt 156.5 lb

## 2022-01-26 DIAGNOSIS — Z483 Aftercare following surgery for neoplasm: Secondary | ICD-10-CM | POA: Insufficient documentation

## 2022-01-26 NOTE — Therapy (Signed)
OUTPATIENT PHYSICAL THERAPY SOZO SCREENING NOTE   Patient Name: Tammie [name] MRN: [id number] DOB:10/06/1975, 46 y.o., female Today's Date: 01/26/2022  PCP: [name], FNP REFERRING PROVIDER: Donnie Mesa, MD   PT End of Session - 01/26/22 1558     Visit Number 2   # unchanged due to screen only   PT Start Time 42    PT Stop Time 1600    PT Time Calculation (min) 4 min    Activity Tolerance Patient tolerated treatment well    Behavior During Therapy WFL for tasks assessed/performed             Past Medical History:  Diagnosis Date   Anxiety    Arrhythmia    palpitation   Breast cancer ([place])    Depression    Family history of breast cancer    Family history of leukemia    Family history of prostate cancer    Family history of stomach cancer    Fibroid, uterine    Fibromyalgia    History of radiation therapy    Left breast- 04/15/21-05/30/21- Dr. Gery Pray   Hx of degenerative disc disease    Hypertension    Interstitial cystitis    Microadenoma    Migraines    PONV (postoperative nausea and vomiting)    Pre-diabetes    Past Surgical History:  Procedure Laterality Date   BREAST LUMPECTOMY Left 03/12/2021   BREAST LUMPECTOMY WITH RADIOACTIVE SEED AND SENTINEL LYMPH NODE BIOPSY Left 03/12/2021   Procedure: LEFT BREAST LUMPECTOMY WITH RADIOACTIVE SEED X2 AND LEFT SENTINEL LYMPH NODE BIOPSY;  Surgeon: Donnie Mesa, MD;  Location: [place];  Service: General;  Laterality: Left;   CHOLECYSTECTOMY     PORT-A-CATH REMOVAL Right 12/02/2021   Procedure: REMOVAL PORT-A-CATH;  Surgeon: Donnie Mesa, MD;  Location: [place];  Service: General;  Laterality: Right;   PORTACATH PLACEMENT Right 10/17/2020   Procedure: INSERTION PORT-A-CATH, ULTRASOUND GUIDED;  Surgeon: Donnie Mesa, MD;  Location: [place];  Service: General;  Laterality: Right;   RADIOACTIVE SEED GUIDED AXILLARY SENTINEL LYMPH NODE Left  03/12/2021   Procedure: RADIOACTIVE SEED GUIDED LEFT AXILLARY SENTINEL LYMPH NODE BIOPSY;  Surgeon: Donnie Mesa, MD;  Location: [place];  Service: General;  Laterality: Left;   Patient Active Problem List   Diagnosis Date Noted   Port-A-Cath in place 12/20/2020   Genetic testing 10/23/2020   Family history of breast cancer    Family history of prostate cancer    Family history of stomach cancer    Family history of leukemia    Malignant neoplasm of lower-outer quadrant of left breast of female, estrogen receptor positive ([place]) 10/03/2020    REFERRING DIAG: left breast cancer at risk for lymphedema  THERAPY DIAG: Aftercare following surgery for neoplasm  PERTINENT HISTORY:  Patient was diagnosed on 08/20/2020 with 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 It is ER/PR weakly positive and HER2 positive with a Ki67 of 20%.            PRECAUTIONS: left UE Lymphedema risk  SUBJECTIVE: Pt here for SOZO screen  PAIN:  Are you having pain? No  SOZO SCREENING: Patient was assessed today using the SOZO machine to determine the lymphedema index score. This was compared to her baseline score. It was determined that she is within  the recommended range when compared to her baseline and no further action is needed at this time. She will continue SOZO screenings. These are done every 3 months for 2 years post operatively followed by every 6 months for 2 years, and then annually.   L-DEX FLOWSHEETS - 01/26/22 1500       L-DEX LYMPHEDEMA SCREENING   Measurement Type Unilateral    L-DEX MEASUREMENT EXTREMITY Upper Extremity    POSITION  Standing    DOMINANT SIDE Right    At Risk Side Left    BASELINE SCORE (UNILATERAL) -1.1    L-DEX SCORE (UNILATERAL) 2.1    VALUE CHANGE (UNILAT) 3.2             Collie Siad, PTA 01/26/22 4:00 PM

## 2022-04-16 ENCOUNTER — Telehealth: Payer: Self-pay | Admitting: *Deleted

## 2022-04-16 NOTE — Telephone Encounter (Signed)
ACCRU-[place]-2102 - TREATMENT OF ESTABLISHED CHEMOTHERAPY-INDUCED NEUROPATHY WITH N-PALMITOYLETHANOLAMIDE, A CANNABIMIMETIC NUTRACEUTICAL: A RANDOMIZED DOUBLE-BLIND PHASE II PILOT TRIAL    SURVIVAL FOLLOW-UP PHONE CALL: Patient was contacted via phone today to complete the 6 Months follow-up phone call. Identify was confirmed using two patient identifiers.   NEUROPATHY STATUS: Patient reports the numbness in her hands is unchanged. She does not feel it is any better or worse in the past 6 months. She has not started any new medications for it but has started wearing a brace on her right wrist/hand at night. She says this helps relieve some of the numbness.  Her right hand feels worse than her left and she currently only using the brace on her right side.  She says the numbness in her toes and feet has resolved in the past few months.   CANCER RECURRENCE: Patient has not been diagnosed with disease recurrence.   The patient was thanked for their time and continued voluntary participation in this study. Patient has been provided direct contact information and is encouraged to contact this Nurse for any needs or questions.  Foye Spurling, BSN, RN, [place] Nurse II [phone] 04/16/2022 3:17 PM

## 2022-04-27 ENCOUNTER — Ambulatory Visit: Payer: No Typology Code available for payment source

## 2022-04-28 ENCOUNTER — Telehealth: Payer: Self-pay | Admitting: *Deleted

## 2022-04-28 NOTE — Assessment & Plan Note (Signed)
10/01/2020:Screening mammogram showed a left breast mass and calcifications. Diagnostic mammogram and US showed a 1.7cm and 0.5cm mass at the 5 o'clock position in the left breast, with one 0.4cm abnormal right axillary lymph node. 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   Treatment Plan: 1. Neoadjuvant chemotherapy with [facility] Perjeta 6 cycles followed by Herceptin Perjeta maintenance for 1 year completed June 2023 2. 03/12/2021: Pathologic complete response, 0/6 lymph nodes negative 3. Followed by adjuvant radiation therapy completed 05/30/2021 4.  Followed by antiestrogen therapy although ER is weak URCC nausea study Breast MRI: 10/17/20: 2.4 cm left breast mass, intramammary lymph node ------------------------------------------------------------------------------------------------------------------------- Severe hot flashes: Menopausal symptoms. On Effexor.  If her hot flashes improve from severe to mild to moderate and we can initiate antiestrogen therapy. Chemo-induced peripheral neuropathy: Participated in clinical trial with PEA: Did not notice any improvement   Current treatment: Anastrozole 1 mg daily started 10/28/2021 Anastrozole toxicities: Diffuse muscle aches and pains: I instructed the patient to stop anastrozole.  We will reassess her symptoms in a month and then make a decision. Hot flashes have not improved with Effexor therefore she discontinued it.  Peripheral neuropathy: Continues to remain.  Nothing appears to make it any better.   Breast cancer surveillance: 1.  Breast exam May 2023: Benign 2. mammogram left breast 07/21/2021: Postoperative seroma 3.2 cm, recommended left axillary ultrasound in 6 months 3.  Bone density 12/22/2021: T score -1.3 (mild osteopenia)   Telephone visit 1 month to discuss symptoms and whether they got better after stopping anastrozole

## 2022-04-28 NOTE — Telephone Encounter (Signed)
Received call from pt with complaint of severe numbness and tingling in bilateral lower extremities and bilateral upper extremities.  Pt describes pain as constant and severe requesting to see MD for further evaluation and tx.  Appt scheduled, pt verbalized understating of date and time.

## 2022-04-29 ENCOUNTER — Inpatient Hospital Stay
Payer: No Typology Code available for payment source | Attending: Hematology and Oncology | Admitting: Hematology and Oncology

## 2022-04-29 VITALS — BP 139/83 | HR 87 | Temp 97.7°F | Resp 18 | Ht 63.0 in | Wt 162.4 lb

## 2022-04-29 DIAGNOSIS — Z9221 Personal history of antineoplastic chemotherapy: Secondary | ICD-10-CM | POA: Insufficient documentation

## 2022-04-29 DIAGNOSIS — C50512 Malignant neoplasm of lower-outer quadrant of left female breast: Secondary | ICD-10-CM | POA: Diagnosis present

## 2022-04-29 DIAGNOSIS — Z170 Estrogen receptor positive status [ER+]: Secondary | ICD-10-CM | POA: Diagnosis not present

## 2022-04-29 DIAGNOSIS — Z923 Personal history of irradiation: Secondary | ICD-10-CM | POA: Insufficient documentation

## 2022-04-29 DIAGNOSIS — Z79811 Long term (current) use of aromatase inhibitors: Secondary | ICD-10-CM | POA: Diagnosis not present

## 2022-04-29 NOTE — Progress Notes (Signed)
Patient Care Team: [name], FNP as PCP - General (Family Medicine) Donnie Mesa, MD as Consulting Physician (General Surgery) Nicholas Lose, MD as Consulting Physician (Hematology and Oncology) Gery Pray, MD as Consulting Physician (Radiation Oncology)  DIAGNOSIS:  Encounter Diagnosis  Name Primary?   Malignant neoplasm of lower-outer quadrant of left breast of female, estrogen receptor positive ([place]) Yes    SUMMARY OF ONCOLOGIC HISTORY: Oncology History  Malignant neoplasm of lower-outer quadrant of left breast of female, estrogen receptor positive ([place])  10/01/2020 Initial Diagnosis   Screening mammogram showed a left breast mass and calcifications. Diagnostic mammogram and US showed a 1.7cm and 0.5cm mass at the 5 o'clock position in the left breast, with one 0.4cm abnormal right axillary lymph node. 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   10/09/2020 Cancer Staging   Staging form: Breast, AJCC 8th Edition - Clinical stage from 10/09/2020: Stage IB (cT2, cN1, cM0, G3, ER+, PR+, HER2+) - Signed by Nicholas Lose, MD on 10/09/2020 Stage prefix: Initial diagnosis Histologic grading system: 3 grade system Percentage of positive estrogen receptors (%): 5 Percentage of positive progesterone receptors (%): 1 Ki-67 (%): 20   10/18/2020 - 02/27/2021 Chemotherapy   Patient is on Treatment Plan : BREAST  Docetaxel + Carboplatin + Trastuzumab + Pertuzumab  (TCHP) q21d      10/23/2020 Genetic Testing   Negative genetic testing:  No pathogenic variants detected on the [employer] CancerNext-Expanded + RNAinsight panel. The report date is 10/23/2020.   The CancerNext-Expanded + RNAinsight gene panel offered by [employer] and includes sequencing and rearrangement analysis for the following 77 genes: AIP, ALK, APC, ATM, AXIN2, BAP1, BARD1, BLM, BMPR1A, BRCA1, BRCA2, BRIP1, CDC73, CDH1, CDK4, CDKN1B, CDKN2A, CHEK2,  CTNNA1, DICER1, FANCC, FH, FLCN, GALNT12, KIF1B, LZTR1, MAX, MEN1, MET, MLH1, MSH2, MSH3, MSH6, MUTYH, NBN, NF1, NF2, NTHL1, PALB2, PHOX2B, PMS2, POT1, PRKAR1A, PTCH1, PTEN, RAD51C, RAD51D, RB1, RECQL, RET, SDHA, SDHAF2, SDHB, SDHC, SDHD, SMAD4, SMARCA4, SMARCB1, SMARCE1, STK11, SUFU, TMEM127, TP53, TSC1, TSC2, VHL and XRCC2 (sequencing and deletion/duplication); EGFR, EGLN1, HOXB13, KIT, MITF, PDGFRA, POLD1 and POLE (sequencing only); EPCAM and GREM1 (deletion/duplication only). RNA data is routinely analyzed for use in variant interpretation for all genes.   03/31/2021 - 10/28/2021 Chemotherapy   Patient is on Treatment Plan : BREAST Trastuzumab  + Pertuzumab q21d x 13 cycles     04/15/2021 - 05/30/2021 Radiation Therapy   Site Technique Total Dose (Gy) Dose per Fx (Gy) Completed Fx Beam Energies  Breast, Left: Breast_Lt 3D 50.4/50.4 1.8 28/28 6X  Axilla, Left: Axilla_Lt 3D 50.4/50.4 1.8 28/28 6X, 10X       CHIEF COMPLIANT: Follow-up to discuss neuropathy  INTERVAL HISTORY: Tammie [name] is a 46 y.o. with above-mentioned history of left breast cancer who completed neoadjuvant chemotherapy. She presents to the clinic today for a follow-up to discuss neuropathy. She reports a lot of pain in legs feet and arms. She has been feeling like this in over a week. She have numbness and pain. She also says she is gaining a lot of weight. Denies any pain or discomfort in breast.   ALLERGIES:  has No Known Allergies.  MEDICATIONS:  Current Outpatient Medications  Medication Sig Dispense Refill   ascorbic acid (VITAMIN C) 1000 MG tablet Take 1,000 mg by mouth daily.     BEE POLLEN PO Take 500 mg by mouth 2 (two) times daily. 1 in the morning  and 2 at night     Cholecalciferol (DIALYVITE VITAMIN D 5000) 125 MCG (5000 UT) capsule Take 5,000 Units by mouth daily.     Coral Calcium 1000 (390 Ca) MG TABS Take 1 capsule by mouth 2 (two) times daily.     ibuprofen (ADVIL) 800 MG tablet Take 800 mg by mouth  2 (two) times daily as needed.     Magnesium 500 MG CAPS Take 1 capsule (500 mg total) by mouth daily.  0   metoprolol tartrate (LOPRESSOR) 25 MG tablet Take 25 mg by mouth daily.     NON FORMULARY Take 250 mg by mouth daily. Hyaluronic Acid Complex     Omega-3 Fatty Acids (OMEGA 3 500 PO) Take 500 mg by mouth daily.     OVER THE COUNTER MEDICATION Take 2 capsules by mouth 2 (two) times daily. Laminine supplement- 620 mg     anastrozole (ARIMIDEX) 1 MG tablet Take 1 tablet (1 mg total) by mouth daily. (Patient not taking: Reported on 04/29/2022) 90 tablet 3   cloNIDine (CATAPRES) 0.1 MG tablet Take 0.1 mg by mouth at bedtime.     ondansetron (ZOFRAN) 8 MG tablet Take 1 tablet (8 mg total) by mouth every 8 (eight) hours as needed for nausea. (Patient not taking: Reported on 10/28/2021) 30 tablet 3   No current facility-administered medications for this visit.    PHYSICAL EXAMINATION: ECOG PERFORMANCE STATUS: 1 - Symptomatic but completely ambulatory  Vitals:   04/29/22 1123  BP: 139/83  Pulse: 87  Resp: 18  Temp: 97.7 F (36.5 C)  SpO2: 97%   Filed Weights   04/29/22 1123  Weight: 162 lb 6.4 oz (73.7 kg)      LABORATORY DATA:  I have reviewed the data as listed    Latest Ref Rng & Units 12/30/2021    8:31 AM 11/27/2021    1:50 PM 11/14/2021    2:03 PM  CMP  Glucose 70 - 99 mg/dL 85  102  106   BUN 6 - 20 mg/dL _0 Creatinine 0.44 - 1.00 mg/dL 0.71  0.68  0.74   Sodium 135 - 145 mmol/L 139  142  138   Potassium 3.5 - 5.1 mmol/L 3.7  4.0  3.0   Chloride 98 - 111 mmol/L 104  103  104   CO2 22 - 32 mmol/L _1 Calcium 8.9 - 10.3 mg/dL 9.9  9.9  9.1   Total Protein 6.5 - 8.1 g/dL 7.2   7.3   Total Bilirubin 0.3 - 1.2 mg/dL 0.2   0.5   Alkaline Phos 38 - 126 U/L 65   56   AST 15 - 41 U/L 21   86   ALT 0 - 44 U/L 30   155     Lab Results  Component Value Date   WBC 5.0 12/30/2021   HGB 12.6 12/30/2021   HCT 36.6 12/30/2021   MCV 87.4 12/30/2021   PLT  188 12/30/2021   NEUTROABS 2.8 12/30/2021    ASSESSMENT & PLAN:  Malignant neoplasm of lower-outer quadrant of left breast of female, estrogen receptor positive ([place]) 10/01/2020:Screening mammogram showed a left breast mass and calcifications. Diagnostic mammogram and US showed a 1.7cm and 0.5cm mass at the 5 o'clock position in the left breast, with one 0.4cm abnormal right axillary lymph node. Biopsy showed invasive and in situ ductal carcinoma, grade 3, HER-2 positive (3+), ER+ 5% weak, PR+  1%, Ki67 20%.   Treatment Plan: 1. Neoadjuvant chemotherapy with [facility] Perjeta 6 cycles followed by Herceptin Perjeta maintenance for 1 year completed June 2023 2. 03/12/2021: Pathologic complete response, 0/6 lymph nodes negative 3. Followed by adjuvant radiation therapy completed 05/30/2021 4.  Followed by antiestrogen therapy although ER is weak URCC nausea study Breast MRI: 10/17/20: 2.4 cm left breast mass, intramammary lymph node ------------------------------------------------------------------------------------------------------------------------- Severe hot flashes: Menopausal symptoms. On Effexor.  If her hot flashes improve from severe to mild to moderate and we can initiate antiestrogen therapy. Chemo-induced peripheral neuropathy: Participated in clinical trial with PEA: Did not notice any improvement   Current treatment: Anastrozole 1 mg daily started 10/28/2021 Anastrozole toxicities: Diffuse muscle aches and pains: I instructed the patient to stop anastrozole.  We will reassess her symptoms in a month and then make a decision. Hot flashes have not improved with Effexor therefore she discontinued it.  Peripheral neuropathy: Continues to remain.  Nothing appears to make it any better.   Breast cancer surveillance: 1.  Breast exam May 2023: Benign 2. mammogram left breast 07/21/2021: Postoperative seroma 3.2 cm, recommended left axillary ultrasound in 6 months 3.  Bone density 12/22/2021: T  score -1.3 (mild osteopenia)   Telephone visit 1 month to discuss symptoms and whether they got better after stopping anastrozole      No orders of the defined types were placed in this encounter.  The patient has a good understanding of the overall plan. she agrees with it. she will call with any problems that may develop before the next visit here. Total time spent: 30 mins including face to face time and time spent for planning, charting and co-ordination of care   Harriette Ohara, MD 04/29/22    I Gardiner Coins am scribing for Dr. Lindi Adie  I have reviewed the above documentation for accuracy and completeness, and I agree with the above.

## 2022-05-25 ENCOUNTER — Ambulatory Visit: Payer: No Typology Code available for payment source | Attending: Surgery

## 2022-05-25 VITALS — Wt 165.2 lb

## 2022-05-25 DIAGNOSIS — Z483 Aftercare following surgery for neoplasm: Secondary | ICD-10-CM | POA: Insufficient documentation

## 2022-05-25 NOTE — Therapy (Signed)
OUTPATIENT PHYSICAL THERAPY SOZO SCREENING NOTE   Patient Name: Tammie [name] MRN: [id number] DOB:[birth year], 46 y.o., female Today's Date: 05/25/2022  PCP: [name], FNP REFERRING PROVIDER: Donnie Mesa, MD   PT End of Session - 05/25/22 1600     Visit Number 2   # unchanged due to screen only   PT Start Time 1559    PT Stop Time 1603    PT Time Calculation (min) 4 min    Activity Tolerance Patient tolerated treatment well    Behavior During Therapy WFL for tasks assessed/performed             Past Medical History:  Diagnosis Date   Anxiety    Arrhythmia    palpitation   Breast cancer ([place])    Depression    Family history of breast cancer    Family history of leukemia    Family history of prostate cancer    Family history of stomach cancer    Fibroid, uterine    Fibromyalgia    History of radiation therapy    Left breast- 04/15/21-05/30/21- Dr. Gery Pray   Hx of degenerative disc disease    Hypertension    Interstitial cystitis    Microadenoma    Migraines    PONV (postoperative nausea and vomiting)    Pre-diabetes    Past Surgical History:  Procedure Laterality Date   BREAST LUMPECTOMY Left 03/12/2021   BREAST LUMPECTOMY WITH RADIOACTIVE SEED AND SENTINEL LYMPH NODE BIOPSY Left 03/12/2021   Procedure: LEFT BREAST LUMPECTOMY WITH RADIOACTIVE SEED X2 AND LEFT SENTINEL LYMPH NODE BIOPSY;  Surgeon: Donnie Mesa, MD;  Location: [facility];  Service: General;  Laterality: Left;   CHOLECYSTECTOMY     PORT-A-CATH REMOVAL Right 12/02/2021   Procedure: REMOVAL PORT-A-CATH;  Surgeon: Donnie Mesa, MD;  Location: [facility];  Service: General;  Laterality: Right;   PORTACATH PLACEMENT Right 10/17/2020   Procedure: INSERTION PORT-A-CATH, ULTRASOUND GUIDED;  Surgeon: Donnie Mesa, MD;  Location: [facility];  Service: General;  Laterality: Right;   RADIOACTIVE SEED GUIDED AXILLARY SENTINEL LYMPH NODE Left  03/12/2021   Procedure: RADIOACTIVE SEED GUIDED LEFT AXILLARY SENTINEL LYMPH NODE BIOPSY;  Surgeon: Donnie Mesa, MD;  Location: [facility];  Service: General;  Laterality: Left;   Patient Active Problem List   Diagnosis Date Noted   Port-A-Cath in place 12/20/2020   Genetic testing 10/23/2020   Family history of breast cancer    Family history of prostate cancer    Family history of stomach cancer    Family history of leukemia    Malignant neoplasm of lower-outer quadrant of left breast of female, estrogen receptor positive ([place]) 10/03/2020    REFERRING DIAG: left breast cancer at risk for lymphedema  THERAPY DIAG: Aftercare following surgery for neoplasm  PERTINENT HISTORY:  Patient was diagnosed on 08/20/2020 with 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 It is ER/PR weakly positive and HER2 positive with a Ki67 of 20%.            PRECAUTIONS: left UE Lymphedema risk  SUBJECTIVE: Pt here for SOZO screen  PAIN:  Are you having pain? No  SOZO SCREENING: Patient was assessed today using the SOZO machine to determine the lymphedema index score. This was compared to her baseline score. It was determined that she is within  the recommended range when compared to her baseline and no further action is needed at this time. She will continue SOZO screenings. These are done every 3 months for 2 years post operatively followed by every 6 months for 2 years, and then annually.   L-DEX FLOWSHEETS - 05/25/22 1600       L-DEX LYMPHEDEMA SCREENING   Measurement Type Unilateral    L-DEX MEASUREMENT EXTREMITY Upper Extremity    POSITION  Standing    DOMINANT SIDE Right    At Risk Side Left    BASELINE SCORE (UNILATERAL) -1.1    L-DEX SCORE (UNILATERAL) 0.8    VALUE CHANGE (UNILAT) 1.9             Collie Siad, PTA 05/25/22 4:01 PM

## 2022-05-26 ENCOUNTER — Inpatient Hospital Stay
Payer: No Typology Code available for payment source | Attending: Hematology and Oncology | Admitting: Hematology and Oncology

## 2022-05-26 DIAGNOSIS — Z170 Estrogen receptor positive status [ER+]: Secondary | ICD-10-CM | POA: Diagnosis not present

## 2022-05-26 DIAGNOSIS — C50512 Malignant neoplasm of lower-outer quadrant of left female breast: Secondary | ICD-10-CM | POA: Diagnosis not present

## 2022-05-26 MED ORDER — LETROZOLE 2.5 MG PO TABS: 2.5000 mg | ORAL_TABLET | Freq: Every day | ORAL | 3 refills | Status: DC

## 2022-05-26 NOTE — Progress Notes (Signed)
HEMATOLOGY-ONCOLOGY TELEPHONE VISIT PROGRESS NOTE  I connected with our patient on 05/26/22 at 11:15 AM EST by telephone and verified that I am speaking with the correct person using two identifiers.  I discussed the limitations, risks, security and privacy concerns of performing an evaluation and management service by telephone and the availability of in person appointments.  I also discussed with the patient that there may be a patient responsible charge related to this service. The patient expressed understanding and agreed to proceed.   History of Present Illness:Tammie [name] is a 46 y.o. with above-mentioned history of left breast cancer who completed neoadjuvant chemotherapy. She presents to the clinic today for a telephone follow-up discuss symptoms and whether they got better after stopping anastrozole.  She reports that the joint aches and pains have improved significantly since she stopped anastrozole.  Hot flashes however have not improved.  Oncology History  Malignant neoplasm of lower-outer quadrant of left breast of female, estrogen receptor positive ([place])  10/01/2020 Initial Diagnosis   Screening mammogram showed a left breast mass and calcifications. Diagnostic mammogram and US showed a 1.7cm and 0.5cm mass at the 5 o'clock position in the left breast, with one 0.4cm abnormal right axillary lymph node. 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   10/09/2020 Cancer Staging   Staging form: Breast, AJCC 8th Edition - Clinical stage from 10/09/2020: Stage IB (cT2, cN1, cM0, G3, ER+, PR+, HER2+) - Signed by Nicholas Lose, MD on 10/09/2020 Stage prefix: Initial diagnosis Histologic grading system: 3 grade system Percentage of positive estrogen receptors (%): 5 Percentage of positive progesterone receptors (%): 1 Ki-67 (%): 20   10/18/2020 - 02/27/2021 Chemotherapy   Patient is on Treatment Plan : BREAST  Docetaxel +  Carboplatin + Trastuzumab + Pertuzumab  (TCHP) q21d      10/23/2020 Genetic Testing   Negative genetic testing:  No pathogenic variants detected on the [employer] CancerNext-Expanded + RNAinsight panel. The report date is 10/23/2020.   The CancerNext-Expanded + RNAinsight gene panel offered by [employer] and includes sequencing and rearrangement analysis for the following 77 genes: AIP, ALK, APC, ATM, AXIN2, BAP1, BARD1, BLM, BMPR1A, BRCA1, BRCA2, BRIP1, CDC73, CDH1, CDK4, CDKN1B, CDKN2A, CHEK2, CTNNA1, DICER1, FANCC, FH, FLCN, GALNT12, KIF1B, LZTR1, MAX, MEN1, MET, MLH1, MSH2, MSH3, MSH6, MUTYH, NBN, NF1, NF2, NTHL1, PALB2, PHOX2B, PMS2, POT1, PRKAR1A, PTCH1, PTEN, RAD51C, RAD51D, RB1, RECQL, RET, SDHA, SDHAF2, SDHB, SDHC, SDHD, SMAD4, SMARCA4, SMARCB1, SMARCE1, STK11, SUFU, TMEM127, TP53, TSC1, TSC2, VHL and XRCC2 (sequencing and deletion/duplication); EGFR, EGLN1, HOXB13, KIT, MITF, PDGFRA, POLD1 and POLE (sequencing only); EPCAM and GREM1 (deletion/duplication only). RNA data is routinely analyzed for use in variant interpretation for all genes.   03/31/2021 - 10/28/2021 Chemotherapy   Patient is on Treatment Plan : BREAST Trastuzumab  + Pertuzumab q21d x 13 cycles     04/15/2021 - 05/30/2021 Radiation Therapy   Site Technique Total Dose (Gy) Dose per Fx (Gy) Completed Fx Beam Energies  Breast, Left: Breast_Lt 3D 50.4/50.4 1.8 28/28 6X  Axilla, Left: Axilla_Lt 3D 50.4/50.4 1.8 28/28 6X, 10X       REVIEW OF SYSTEMS:   Constitutional: Denies fevers, chills or abnormal weight loss All other systems were reviewed with the patient and are negative. Observations/Objective:     Assessment Plan:  Malignant neoplasm of lower-outer quadrant of left breast of female, estrogen receptor positive ([place]) 10/01/2020:Screening mammogram showed a left breast mass and calcifications.  Diagnostic mammogram and US showed a 1.7cm and 0.5cm mass at the 5 o'clock position in the left breast, with one 0.4cm abnormal  right axillary lymph node. 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   Treatment Plan: 1. Neoadjuvant chemotherapy with [facility] Perjeta 6 cycles followed by Herceptin Perjeta maintenance for 1 year completed June 2023 2. 03/12/2021: Pathologic complete response, 0/6 lymph nodes negative 3. Followed by adjuvant radiation therapy completed 05/30/2021 4.  Followed by antiestrogen therapy although ER is weak URCC nausea study Breast MRI: 10/17/20: 2.4 cm left breast mass, intramammary lymph node ------------------------------------------------------------------------------------------------------------------------- Severe hot flashes: Menopausal symptoms. On Effexor.  If her hot flashes improve from severe to mild to moderate and we can initiate antiestrogen therapy. Chemo-induced peripheral neuropathy: Participated in clinical trial with PEA: Did not notice any improvement   Current treatment: Anastrozole 1 mg daily started 10/28/2021 stopped 05/14/22 switched to letrozole 05/26/2022  Anastrozole toxicities: Diffuse muscle aches and pains:  Improved with stopping the medication Hot flashes have not improved with Effexor therefore she discontinued it.  Hot flashes have not improved in spite of stopping anastrozole.   Peripheral neuropathy: Continues to remain.  Nothing appears to make it any better.   Breast cancer surveillance: 1.  Breast exam 05/26/2022: Benign 2. mammogram 01/20/2022: Stable postoperative changes 3.  Bone density 12/22/2021: T score -1.3 (mild osteopenia)  Telephone visit in 1 month to assess tolerance to letrozole therapy.   I discussed the assessment and treatment plan with the patient. The patient was provided an opportunity to ask questions and all were answered. The patient agreed with the plan and demonstrated an understanding of the instructions. The patient was advised to call back or seek an in-person  evaluation if the symptoms worsen or if the condition fails to improve as anticipated.   I provided 12 minutes of non-face-to-face time during this encounter.  This includes time for charting and coordination of care   Harriette Ohara, MD  I Gardiner Coins am acting as a scribe for Dr.Cinsere Mizrahi  I have reviewed the above documentation for accuracy and completeness, and I agree with the above.

## 2022-05-26 NOTE — Assessment & Plan Note (Signed)
10/01/2020:Screening mammogram showed a left breast mass and calcifications. Diagnostic mammogram and US showed a 1.7cm and 0.5cm mass at the 5 o'clock position in the left breast, with one 0.4cm abnormal right axillary lymph node. 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   Treatment Plan: 1. Neoadjuvant chemotherapy with [facility] Perjeta 6 cycles followed by Herceptin Perjeta maintenance for 1 year completed June 2023 2. 03/12/2021: Pathologic complete response, 0/6 lymph nodes negative 3. Followed by adjuvant radiation therapy completed 05/30/2021 4.  Followed by antiestrogen therapy although ER is weak URCC nausea study Breast MRI: 10/17/20: 2.4 cm left breast mass, intramammary lymph node ------------------------------------------------------------------------------------------------------------------------- Severe hot flashes: Menopausal symptoms. On Effexor.  If her hot flashes improve from severe to mild to moderate and we can initiate antiestrogen therapy. Chemo-induced peripheral neuropathy: Participated in clinical trial with PEA: Did not notice any improvement   Current treatment: Anastrozole 1 mg daily started 10/28/2021 Anastrozole toxicities: Diffuse muscle aches and pains: I instructed the patient to stop anastrozole.  We will reassess her symptoms in a month and then make a decision. Hot flashes have not improved with Effexor therefore she discontinued it.   Peripheral neuropathy: Continues to remain.  Nothing appears to make it any better.   Breast cancer surveillance: 1.  Breast exam 05/26/2022: Benign 2. mammogram 01/20/2022: Stable postoperative changes 3.  Bone density 12/22/2021: T score -1.3 (mild osteopenia)

## 2022-05-27 ENCOUNTER — Telehealth: Payer: Self-pay | Admitting: Hematology and Oncology

## 2022-05-27 NOTE — Telephone Encounter (Signed)
Scheduled appointment per 12/12 los. Patient is aware.

## 2022-06-01 ENCOUNTER — Telehealth: Payer: Self-pay | Admitting: *Deleted

## 2022-06-01 NOTE — Telephone Encounter (Signed)
Received call from pt stating she has been experiencing neck pain and her PCP has sent her to physical therapy.  Pt states PT is wanting to proceed with an ultrasound of her neck as well as tens unit therapy.  Per MD okay for pt to proceed with tx.  Pt educated and verbalized understanding.

## 2022-06-10 ENCOUNTER — Telehealth: Payer: Self-pay | Admitting: Hematology and Oncology

## 2022-06-10 NOTE — Telephone Encounter (Signed)
Rescheduled appointment per provider on-call. Patient is aware of the changes made to her upcoming appointment. 

## 2022-06-16 NOTE — Progress Notes (Signed)
HEMATOLOGY-ONCOLOGY TELEPHONE VISIT PROGRESS NOTE  I connected with our patient on 06/24/22 at 12:15 PM EST by telephone and verified that I am speaking with the correct person using two identifiers.  I discussed the limitations, risks, security and privacy concerns of performing an evaluation and management service by telephone and the availability of in person appointments.  I also discussed with the patient that there may be a patient responsible charge related to this service. The patient expressed understanding and agreed to proceed.   History of Present Illness: Tammie [name] is a 47 y.o. with above-mentioned history of left breast cancer who completed neoadjuvant chemotherapy. She presents to the clinic today for a telephone follow-up to assess tolerance to letrozole therapy.  She is tolerating letrozole much better with less muscle aches and pains.  She still however continues to have hot flashes.    Oncology History  Malignant neoplasm of lower-outer quadrant of left breast of female, estrogen receptor positive ([place])  10/01/2020 Initial Diagnosis   Screening mammogram showed a left breast mass and calcifications. Diagnostic mammogram and US showed a 1.7cm and 0.5cm mass at the 5 o'clock position in the left breast, with one 0.4cm abnormal right axillary lymph node. 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   10/09/2020 Cancer Staging   Staging form: Breast, AJCC 8th Edition - Clinical stage from 10/09/2020: Stage IB (cT2, cN1, cM0, G3, ER+, PR+, HER2+) - Signed by Nicholas Lose, MD on 10/09/2020 Stage prefix: Initial diagnosis Histologic grading system: 3 grade system Percentage of positive estrogen receptors (%): 5 Percentage of positive progesterone receptors (%): 1 Ki-67 (%): 20   10/18/2020 - 02/27/2021 Chemotherapy   Patient is on Treatment Plan : BREAST  Docetaxel + Carboplatin + Trastuzumab + Pertuzumab   (TCHP) q21d      10/23/2020 Genetic Testing   Negative genetic testing:  No pathogenic variants detected on the [employer] CancerNext-Expanded + RNAinsight panel. The report date is 10/23/2020.   The CancerNext-Expanded + RNAinsight gene panel offered by [employer] and includes sequencing and rearrangement analysis for the following 77 genes: AIP, ALK, APC, ATM, AXIN2, BAP1, BARD1, BLM, BMPR1A, BRCA1, BRCA2, BRIP1, CDC73, CDH1, CDK4, CDKN1B, CDKN2A, CHEK2, CTNNA1, DICER1, FANCC, FH, FLCN, GALNT12, KIF1B, LZTR1, MAX, MEN1, MET, MLH1, MSH2, MSH3, MSH6, MUTYH, NBN, NF1, NF2, NTHL1, PALB2, PHOX2B, PMS2, POT1, PRKAR1A, PTCH1, PTEN, RAD51C, RAD51D, RB1, RECQL, RET, SDHA, SDHAF2, SDHB, SDHC, SDHD, SMAD4, SMARCA4, SMARCB1, SMARCE1, STK11, SUFU, TMEM127, TP53, TSC1, TSC2, VHL and XRCC2 (sequencing and deletion/duplication); EGFR, EGLN1, HOXB13, KIT, MITF, PDGFRA, POLD1 and POLE (sequencing only); EPCAM and GREM1 (deletion/duplication only). RNA data is routinely analyzed for use in variant interpretation for all genes.   03/31/2021 - 10/28/2021 Chemotherapy   Patient is on Treatment Plan : BREAST Trastuzumab  + Pertuzumab q21d x 13 cycles     04/15/2021 - 05/30/2021 Radiation Therapy   Site Technique Total Dose (Gy) Dose per Fx (Gy) Completed Fx Beam Energies  Breast, Left: Breast_Lt 3D 50.4/50.4 1.8 28/28 6X  Axilla, Left: Axilla_Lt 3D 50.4/50.4 1.8 28/28 6X, 10X       REVIEW OF SYSTEMS:   Constitutional: Denies fevers, chills or abnormal weight loss All other systems were reviewed with the patient and are negative. Observations/Objective:     Assessment Plan:  Malignant neoplasm of lower-outer quadrant of left breast of female, estrogen receptor positive ([place]) 10/01/2020:Screening mammogram showed a left breast mass and calcifications. Diagnostic mammogram  and US showed a 1.7cm and 0.5cm mass at the 5 o'clock position in the left breast, with one 0.4cm abnormal right axillary lymph node. Biopsy showed  invasive and in situ ductal carcinoma, grade 3, HER-2 positive (3+), ER+ 5% weak, PR+ 1%, Ki67 20%.   Treatment Plan: 1. Neoadjuvant chemotherapy with [facility] Perjeta 6 cycles followed by Herceptin Perjeta maintenance for 1 year completed June 2023 2. 03/12/2021: Pathologic complete response, 0/6 lymph nodes negative 3. Followed by adjuvant radiation therapy completed 05/30/2021 4.  Followed by antiestrogen therapy although ER is weak URCC nausea study Breast MRI: 10/17/20: 2.4 cm left breast mass, intramammary lymph node ------------------------------------------------------------------------------------------------------------------------- Severe hot flashes: Menopausal symptoms. On Effexor.  If her hot flashes improve from severe to mild to moderate and we can initiate antiestrogen therapy. Chemo-induced peripheral neuropathy: Participated in clinical trial with PEA: Did not notice any improvement   Current treatment: Anastrozole 1 mg daily started 10/28/2021 stopped 05/14/22 switched to letrozole 05/26/2022 (due to diffuse muscle aches and pains and hot flashes)   Letrozole toxicities: Tolerating it much better than the last. Hot flashes; still significant   Peripheral neuropathy: Continues to remain.  Nothing appears to make it any better.   Breast cancer surveillance: 1.  Breast exam 05/26/2022: Benign 2. mammogram 01/20/2022: Stable postoperative changes 3.  Bone density 12/22/2021: T score -1.3 (mild osteopenia)   She has an appointment in July for follow-up with [place].  After that she can be seen on a once a year basis.    I discussed the assessment and treatment plan with the patient. The patient was provided an opportunity to ask questions and all were answered. The patient agreed with the plan and demonstrated an understanding of the instructions. The patient was advised to call back or seek an in-person evaluation if the symptoms worsen or if the condition fails to improve as  anticipated.   I provided 12 minutes of non-face-to-face time during this encounter.  This includes time for charting and coordination of care   Harriette Ohara, MD  I Gardiner Coins am acting as a scribe for Dr.Cherry Turlington  I have reviewed the above documentation for accuracy and completeness, and I agree with the above.

## 2022-06-23 ENCOUNTER — Telehealth: Payer: No Typology Code available for payment source | Admitting: Hematology and Oncology

## 2022-06-23 NOTE — Assessment & Plan Note (Signed)
10/01/2020:Screening mammogram showed a left breast mass and calcifications. Diagnostic mammogram and US showed a 1.7cm and 0.5cm mass at the 5 o'clock position in the left breast, with one 0.4cm abnormal right axillary lymph node. 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   Treatment Plan: 1. Neoadjuvant chemotherapy with [facility] Perjeta 6 cycles followed by Herceptin Perjeta maintenance for 1 year completed June 2023 2. 03/12/2021: Pathologic complete response, 0/6 lymph nodes negative 3. Followed by adjuvant radiation therapy completed 05/30/2021 4.  Followed by antiestrogen therapy although ER is weak URCC nausea study Breast MRI: 10/17/20: 2.4 cm left breast mass, intramammary lymph node ------------------------------------------------------------------------------------------------------------------------- Severe hot flashes: Menopausal symptoms. On Effexor.  If her hot flashes improve from severe to mild to moderate and we can initiate antiestrogen therapy. Chemo-induced peripheral neuropathy: Participated in clinical trial with PEA: Did not notice any improvement   Current treatment: Anastrozole 1 mg daily started 10/28/2021 stopped 05/14/22 switched to letrozole 05/26/2022 (due to diffuse muscle aches and pains and hot flashes)   Letrozole toxicities:    Peripheral neuropathy: Continues to remain.  Nothing appears to make it any better.   Breast cancer surveillance: 1.  Breast exam 05/26/2022: Benign 2. mammogram 01/20/2022: Stable postoperative changes 3.  Bone density 12/22/2021: T score -1.3 (mild osteopenia)   Telephone visit in 1 month to assess tolerance to letrozole therapy.

## 2022-06-24 ENCOUNTER — Inpatient Hospital Stay
Payer: No Typology Code available for payment source | Attending: Hematology and Oncology | Admitting: Hematology and Oncology

## 2022-06-24 DIAGNOSIS — Z170 Estrogen receptor positive status [ER+]: Secondary | ICD-10-CM | POA: Diagnosis not present

## 2022-06-24 DIAGNOSIS — C50512 Malignant neoplasm of lower-outer quadrant of left female breast: Secondary | ICD-10-CM | POA: Diagnosis not present

## 2022-09-07 ENCOUNTER — Ambulatory Visit: Payer: No Typology Code available for payment source | Attending: Surgery

## 2022-09-07 VITALS — Wt 164.4 lb

## 2022-09-07 DIAGNOSIS — Z483 Aftercare following surgery for neoplasm: Secondary | ICD-10-CM | POA: Insufficient documentation

## 2022-09-07 NOTE — Therapy (Signed)
OUTPATIENT PHYSICAL THERAPY SOZO SCREENING NOTE   Patient Name: Tammie [name] MRN: [id number] DOB:[birth year], 47 y.o., female Today's Date: 09/07/2022  PCP: [name], FNP REFERRING PROVIDER: Donnie Mesa, MD   PT End of Session - 09/07/22 1643     Visit Number 2   # unchanged due to screen only   PT Start Time 1642    PT Stop Time 1645    PT Time Calculation (min) 3 min    Activity Tolerance Patient tolerated treatment well    Behavior During Therapy WFL for tasks assessed/performed             Past Medical History:  Diagnosis Date   Anxiety    Arrhythmia    palpitation   Breast cancer ([place])    Depression    Family history of breast cancer    Family history of leukemia    Family history of prostate cancer    Family history of stomach cancer    Fibroid, uterine    Fibromyalgia    History of radiation therapy    Left breast- 04/15/21-05/30/21- Dr. Gery Pray   Hx of degenerative disc disease    Hypertension    Interstitial cystitis    Microadenoma    Migraines    PONV (postoperative nausea and vomiting)    Pre-diabetes    Past Surgical History:  Procedure Laterality Date   BREAST LUMPECTOMY Left 03/12/2021   BREAST LUMPECTOMY WITH RADIOACTIVE SEED AND SENTINEL LYMPH NODE BIOPSY Left 03/12/2021   Procedure: LEFT BREAST LUMPECTOMY WITH RADIOACTIVE SEED X2 AND LEFT SENTINEL LYMPH NODE BIOPSY;  Surgeon: Donnie Mesa, MD;  Location: [place];  Service: General;  Laterality: Left;   CHOLECYSTECTOMY     PORT-A-CATH REMOVAL Right 12/02/2021   Procedure: REMOVAL PORT-A-CATH;  Surgeon: Donnie Mesa, MD;  Location: [place];  Service: General;  Laterality: Right;   PORTACATH PLACEMENT Right 10/17/2020   Procedure: INSERTION PORT-A-CATH, ULTRASOUND GUIDED;  Surgeon: Donnie Mesa, MD;  Location: [place];  Service: General;  Laterality: Right;   RADIOACTIVE SEED GUIDED AXILLARY SENTINEL LYMPH NODE Left  03/12/2021   Procedure: RADIOACTIVE SEED GUIDED LEFT AXILLARY SENTINEL LYMPH NODE BIOPSY;  Surgeon: Donnie Mesa, MD;  Location: [place];  Service: General;  Laterality: Left;   Patient Active Problem List   Diagnosis Date Noted   Port-A-Cath in place 12/20/2020   Genetic testing 10/23/2020   Family history of breast cancer    Family history of prostate cancer    Family history of stomach cancer    Family history of leukemia    Malignant neoplasm of lower-outer quadrant of left breast of female, estrogen receptor positive ([place]) 10/03/2020    REFERRING DIAG: left breast cancer at risk for lymphedema  THERAPY DIAG: Aftercare following surgery for neoplasm  PERTINENT HISTORY:  Patient was diagnosed on 08/20/2020 with 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 It is ER/PR weakly positive and HER2 positive with a Ki67 of 20%.            PRECAUTIONS: left UE Lymphedema risk  SUBJECTIVE: Pt here for SOZO screen  PAIN:  Are you having pain? No  SOZO SCREENING: Patient was assessed today using the SOZO machine to determine the lymphedema index score. This was compared to her baseline score. It was determined that she is within  the recommended range when compared to her baseline and no further action is needed at this time. She will continue SOZO screenings. These are done every 3 months for 2 years post operatively followed by every 6 months for 2 years, and then annually.   L-DEX FLOWSHEETS - 09/07/22 1600       L-DEX LYMPHEDEMA SCREENING   Measurement Type Unilateral    L-DEX MEASUREMENT EXTREMITY Upper Extremity    POSITION  Standing    DOMINANT SIDE Right    At Risk Side Left    BASELINE SCORE (UNILATERAL) -1.1    L-DEX SCORE (UNILATERAL) 2.9    VALUE CHANGE (UNILAT) 4             Collie Siad, PTA 09/07/22 4:44 PM

## 2022-10-06 ENCOUNTER — Telehealth: Payer: Self-pay | Admitting: *Deleted

## 2022-10-06 NOTE — Telephone Encounter (Signed)
[id number] - TREATMENT OF ESTABLISHED CHEMOTHERAPY-INDUCED NEUROPATHY WITH N-PALMITOYLETHANOLAMIDE, A CANNABIMIMETIC NUTRACEUTICAL: A RANDOMIZED DOUBLE-BLIND PHASE II PILOT TRIAL   SURVIVAL FOLLOW-UP PHONE CALL: Patient was contacted via phone today to complete the 12 Months follow-up phone call. Identify was confirmed using two patient identifiers.   NEUROPATHY STATUS: Patient reports her neuropathy has resolved in the past 6 months. She did not take any medication for it and it has gotten better on it's own.    CANCER RECURRENCE: Patient has not been diagnosed with disease recurrence.   Informed patient this is the last contact for the study and patient has completed the study. The patient was thanked for their support and participation.  She verbalized understanding.   Domenica Reamer, BSN, RN, [employer] Nurse II [phone] 10/06/2022 3:37 PM

## 2022-12-14 ENCOUNTER — Other Ambulatory Visit: Payer: Self-pay | Admitting: Hematology and Oncology

## 2022-12-14 DIAGNOSIS — Z9889 Other specified postprocedural states: Secondary | ICD-10-CM

## 2022-12-21 ENCOUNTER — Ambulatory Visit: Payer: No Typology Code available for payment source | Attending: Surgery

## 2022-12-21 VITALS — Wt 165.2 lb

## 2022-12-21 DIAGNOSIS — Z483 Aftercare following surgery for neoplasm: Secondary | ICD-10-CM | POA: Insufficient documentation

## 2022-12-21 NOTE — Therapy (Signed)
OUTPATIENT PHYSICAL THERAPY SOZO SCREENING NOTE   Patient Name: Tammie [name] MRN: [id number] DOB:1976/05/15, 47 y.o., female Today's Date: 12/21/2022  PCP: Jerrye Bushy, FNP REFERRING PROVIDER: Manus Rudd, MD   PT End of Session - 12/21/22 1643     Visit Number 2   # unchanged due to screen only   PT Start Time 1641    PT Stop Time 1645    PT Time Calculation (min) 4 min    Activity Tolerance Patient tolerated treatment well    Behavior During Therapy WFL for tasks assessed/performed             Past Medical History:  Diagnosis Date   Anxiety    Arrhythmia    palpitation   Breast cancer ([facility])    Depression    Family history of breast cancer    Family history of leukemia    Family history of prostate cancer    Family history of stomach cancer    Fibroid, uterine    Fibromyalgia    History of radiation therapy    Left breast- 04/15/21-05/30/21- Dr. Antony Blackbird   Hx of degenerative disc disease    Hypertension    Interstitial cystitis    Microadenoma    Migraines    PONV (postoperative nausea and vomiting)    Pre-diabetes    Past Surgical History:  Procedure Laterality Date   BREAST LUMPECTOMY Left 03/12/2021   BREAST LUMPECTOMY WITH RADIOACTIVE SEED AND SENTINEL LYMPH NODE BIOPSY Left 03/12/2021   Procedure: LEFT BREAST LUMPECTOMY WITH RADIOACTIVE SEED X2 AND LEFT SENTINEL LYMPH NODE BIOPSY;  Surgeon: Manus Rudd, MD;  Location: [facility];  Service: General;  Laterality: Left;   CHOLECYSTECTOMY     PORT-A-CATH REMOVAL Right 12/02/2021   Procedure: REMOVAL PORT-A-CATH;  Surgeon: Manus Rudd, MD;  Location: [facility];  Service: General;  Laterality: Right;   PORTACATH PLACEMENT Right 10/17/2020   Procedure: INSERTION PORT-A-CATH, ULTRASOUND GUIDED;  Surgeon: Manus Rudd, MD;  Location: [facility];  Service: General;  Laterality: Right;   RADIOACTIVE SEED GUIDED AXILLARY SENTINEL LYMPH NODE Left  03/12/2021   Procedure: RADIOACTIVE SEED GUIDED LEFT AXILLARY SENTINEL LYMPH NODE BIOPSY;  Surgeon: Manus Rudd, MD;  Location: [facility];  Service: General;  Laterality: Left;   Patient Active Problem List   Diagnosis Date Noted   Port-A-Cath in place 12/20/2020   Genetic testing 10/23/2020   Family history of breast cancer    Family history of prostate cancer    Family history of stomach cancer    Family history of leukemia    Malignant neoplasm of lower-outer quadrant of left breast of female, estrogen receptor positive ([facility]) 10/03/2020    REFERRING DIAG: left breast cancer at risk for lymphedema  THERAPY DIAG: Aftercare following surgery for neoplasm  PERTINENT HISTORY:  Patient was diagnosed on 08/20/2020 with 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 It is ER/PR weakly positive and HER2 positive with a Ki67 of 20%.            PRECAUTIONS: left UE Lymphedema risk  SUBJECTIVE: Pt returns for her 3 month L-Dex screen.   PAIN:  Are you having pain? No  SOZO SCREENING: Patient was assessed today using the SOZO machine to determine the lymphedema index score. This was compared to her baseline score. It was determined  that she is within the recommended range when compared to her baseline and no further action is needed at this time. She will continue SOZO screenings. These are done every 3 months for 2 years post operatively followed by every 6 months for 2 years, and then annually.   L-DEX FLOWSHEETS - 12/21/22 1600       L-DEX LYMPHEDEMA SCREENING   Measurement Type Unilateral    L-DEX MEASUREMENT EXTREMITY Upper Extremity    POSITION  Standing    DOMINANT SIDE Right    At Risk Side Left    BASELINE SCORE (UNILATERAL) -1.1    L-DEX SCORE (UNILATERAL) -0.6    VALUE CHANGE (UNILAT) 0.5             Berna Spare, PTA 12/21/22 4:44 PM

## 2022-12-29 NOTE — Progress Notes (Signed)
Patient Care Team: Jerrye Bushy, FNP as PCP - General (Family Medicine) Manus Rudd, MD as Consulting Physician (General Surgery) Serena Croissant, MD as Consulting Physician (Hematology and Oncology) Antony Blackbird, MD as Consulting Physician (Radiation Oncology)  DIAGNOSIS:  Encounter Diagnosis  Name Primary?   Malignant neoplasm of lower-outer quadrant of left breast of female, estrogen receptor positive (HCC) Yes    SUMMARY OF ONCOLOGIC HISTORY: Oncology History  Malignant neoplasm of lower-outer quadrant of left breast of female, estrogen receptor positive (HCC)  10/01/2020 Initial Diagnosis   Screening mammogram showed a left breast mass and calcifications. Diagnostic mammogram and US showed a 1.7cm and 0.5cm mass at the 5 o'clock position in the left breast, with one 0.4cm abnormal right axillary lymph node. 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   10/09/2020 Cancer Staging   Staging form: Breast, AJCC 8th Edition - Clinical stage from 10/09/2020: Stage IB (cT2, cN1, cM0, G3, ER+, PR+, HER2+) - Signed by Serena Croissant, MD on 10/09/2020 Stage prefix: Initial diagnosis Histologic grading system: 3 grade system Percentage of positive estrogen receptors (%): 5 Percentage of positive progesterone receptors (%): 1 Ki-67 (%): 20   10/18/2020 - 02/27/2021 Chemotherapy   Patient is on Treatment Plan : BREAST  Docetaxel + Carboplatin + Trastuzumab + Pertuzumab  (TCHP) q21d      10/23/2020 Genetic Testing   Negative genetic testing:  No pathogenic variants detected on the [employer] CancerNext-Expanded + RNAinsight panel. The report date is 10/23/2020.   The CancerNext-Expanded + RNAinsight gene panel offered by [employer] and includes sequencing and rearrangement analysis for the following 77 genes: AIP, ALK, APC, ATM, AXIN2, BAP1, BARD1, BLM, BMPR1A, BRCA1, BRCA2, BRIP1, CDC73, CDH1, CDK4, CDKN1B, CDKN2A, CHEK2,  CTNNA1, DICER1, FANCC, FH, FLCN, GALNT12, KIF1B, LZTR1, MAX, MEN1, MET, MLH1, MSH2, MSH3, MSH6, MUTYH, NBN, NF1, NF2, NTHL1, PALB2, PHOX2B, PMS2, POT1, PRKAR1A, PTCH1, PTEN, RAD51C, RAD51D, RB1, RECQL, RET, SDHA, SDHAF2, SDHB, SDHC, SDHD, SMAD4, SMARCA4, SMARCB1, SMARCE1, STK11, SUFU, TMEM127, TP53, TSC1, TSC2, VHL and XRCC2 (sequencing and deletion/duplication); EGFR, EGLN1, HOXB13, KIT, MITF, PDGFRA, POLD1 and POLE (sequencing only); EPCAM and GREM1 (deletion/duplication only). RNA data is routinely analyzed for use in variant interpretation for all genes.   03/31/2021 - 10/28/2021 Chemotherapy   Patient is on Treatment Plan : BREAST Trastuzumab  + Pertuzumab q21d x 13 cycles     04/15/2021 - 05/30/2021 Radiation Therapy   Site Technique Total Dose (Gy) Dose per Fx (Gy) Completed Fx Beam Energies  Breast, Left: Breast_Lt 3D 50.4/50.4 1.8 28/28 6X  Axilla, Left: Axilla_Lt 3D 50.4/50.4 1.8 28/28 6X, 10X       CHIEF COMPLIANT: Follow-up letrozole   INTERVAL HISTORY: Tammie [name] is a 47 y.o. with above-mentioned history of left breast cancer who completed neoadjuvant chemotherapy. She presents to the clinic today for a follow-up. Pt reports that she is tolerating the letrozole fairly well. She states that she has started back bleeding. Her main complaint is she not able to loose weight. She watching what she eats and making a lot of changes to be healthy.  Hot flashes has subsided. Denies any pain or discomfort in breast. She does aerobics and walk on the treadmill for exercise.   ALLERGIES:  has No Known Allergies.  MEDICATIONS:  Current Outpatient Medications  Medication Sig Dispense Refill   ascorbic acid (VITAMIN C) 1000 MG tablet Take 1,000 mg by mouth daily.  BEE POLLEN PO Take 500 mg by mouth 2 (two) times daily. 1 in the morning and 2 at night     Cholecalciferol (DIALYVITE VITAMIN D 5000) 125 MCG (5000 UT) capsule Take 5,000 Units by mouth daily.     Coral Calcium 1000 (390  Ca) MG TABS Take 1 capsule by mouth 2 (two) times daily.     ibuprofen (ADVIL) 800 MG tablet Take 800 mg by mouth 2 (two) times daily as needed.     letrozole (FEMARA) 2.5 MG tablet Take 1 tablet (2.5 mg total) by mouth daily. 90 tablet 3   Magnesium 500 MG CAPS Take 1 capsule (500 mg total) by mouth daily.  0   metoprolol tartrate (LOPRESSOR) 25 MG tablet Take 25 mg by mouth daily.     NON FORMULARY Take 250 mg by mouth daily. Hyaluronic Acid Complex     Omega-3 Fatty Acids (OMEGA 3 500 PO) Take 500 mg by mouth daily.     OVER THE COUNTER MEDICATION Take 2 capsules by mouth 2 (two) times daily. Laminine supplement- 620 mg     No current facility-administered medications for this visit.    PHYSICAL EXAMINATION: ECOG PERFORMANCE STATUS: 1 - Symptomatic but completely ambulatory  Vitals:   12/31/22 0929  BP: 128/76  Pulse: 85  Resp: 18  Temp: (!) 97.3 F (36.3 C)  SpO2: 98%   Filed Weights   12/31/22 0929  Weight: 164 lb 9.6 oz (74.7 kg)    BREAST: No palpable masses or nodules in either right or left breasts. No palpable axillary supraclavicular or infraclavicular adenopathy no breast tenderness or nipple discharge. (exam performed in the presence of a chaperone)  LABORATORY DATA:  I have reviewed the data as listed    Latest Ref Rng & Units 12/30/2021    8:31 AM 11/27/2021    1:50 PM 11/14/2021    2:03 PM  CMP  Glucose 70 - 99 mg/dL 85  409  811   BUN 6 - 20 mg/dL 25  15  18    Creatinine 0.44 - 1.00 mg/dL 9.14  7.82  9.56   Sodium 135 - 145 mmol/L 139  142  138   Potassium 3.5 - 5.1 mmol/L 3.7  4.0  3.0   Chloride 98 - 111 mmol/L 104  103  104   CO2 22 - 32 mmol/L 28  27  26    Calcium 8.9 - 10.3 mg/dL 9.9  9.9  9.1   Total Protein 6.5 - 8.1 g/dL 7.2   7.3   Total Bilirubin 0.3 - 1.2 mg/dL 0.2   0.5   Alkaline Phos 38 - 126 U/L 65   56   AST 15 - 41 U/L 21   86   ALT 0 - 44 U/L 30   155     Lab Results  Component Value Date   WBC 5.0 12/30/2021   HGB 12.6  12/30/2021   HCT 36.6 12/30/2021   MCV 87.4 12/30/2021   PLT 188 12/30/2021   NEUTROABS 2.8 12/30/2021    ASSESSMENT & PLAN:  Malignant neoplasm of lower-outer quadrant of left breast of female, estrogen receptor positive (HCC) 10/01/2020:Screening mammogram showed a left breast mass and calcifications. Diagnostic mammogram and US showed a 1.7cm and 0.5cm mass at the 5 o'clock position in the left breast, with one 0.4cm abnormal right axillary lymph node. 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  Treatment Plan: 1. Neoadjuvant chemotherapy with [facility] Perjeta 6 cycles followed by Herceptin Perjeta maintenance for 1 year completed June 2023 2. 03/12/2021: Pathologic complete response, 0/6 lymph nodes negative 3. Followed by adjuvant radiation therapy completed 05/30/2021 4.  Followed by antiestrogen therapy although ER is weak URCC nausea study Breast MRI: 10/17/20: 2.4 cm left breast mass, intramammary lymph node ------------------------------------------------------------------------------------------------------------------------- Severe hot flashes: Menopausal symptoms. On Effexor.  If her hot flashes improve from severe to mild to moderate and we can initiate antiestrogen therapy. Chemo-induced peripheral neuropathy: Participated in clinical trial with [facility]: Did not notice any improvement   Current treatment: Anastrozole 1 mg daily started 10/28/2021 stopped 05/14/22 switched to letrozole 05/26/2022 (due to diffuse muscle aches and pains and hot flashes)   Letrozole toxicities: Tolerating it much better than the last. Hot flashes; still significant 3.  Weight gain issues: She is watching her diet and trying to lose a few pounds.  Breast cancer surveillance: 1.  Breast exam 12/31/2022: Benign 2. mammogram 01/20/2022: Stable postoperative changes 3.  Bone density 12/22/2021: T score -1.3 (mild osteopenia)  Uterine  bleeding: Unclear etiology.  Last year we have checked twice for menopause and she was not menopausal range.  We will recheck it again today.  I will call her in a week to go over the test results.  Return to clinic in 1 year for follow-up    Orders Placed This Encounter  Procedures   FSH-Follicle stimulating hormone    Standing Status:   Future    Number of Occurrences:   1    Standing Expiration Date:   12/31/2023   Estradiol, Sensitive    Standing Status:   Future    Number of Occurrences:   1    Standing Expiration Date:   12/31/2023   The patient has a good understanding of the overall plan. she agrees with it. she will call with any problems that may develop before the next visit here. Total time spent: 30 mins including face to face time and time spent for planning, charting and co-ordination of care   Tamsen Meek, MD 12/31/22    I Janan Ridge am acting as a Neurosurgeon for [employer]  I have reviewed the above documentation for accuracy and completeness, and I agree with the above.

## 2022-12-31 ENCOUNTER — Inpatient Hospital Stay
Payer: No Typology Code available for payment source | Attending: Hematology and Oncology | Admitting: Hematology and Oncology

## 2022-12-31 ENCOUNTER — Inpatient Hospital Stay: Payer: No Typology Code available for payment source

## 2022-12-31 ENCOUNTER — Other Ambulatory Visit: Payer: Self-pay

## 2022-12-31 VITALS — BP 128/76 | HR 85 | Temp 97.3°F | Resp 18 | Ht 63.0 in | Wt 164.6 lb

## 2022-12-31 DIAGNOSIS — Z923 Personal history of irradiation: Secondary | ICD-10-CM | POA: Insufficient documentation

## 2022-12-31 DIAGNOSIS — Z9221 Personal history of antineoplastic chemotherapy: Secondary | ICD-10-CM | POA: Diagnosis not present

## 2022-12-31 DIAGNOSIS — Z170 Estrogen receptor positive status [ER+]: Secondary | ICD-10-CM | POA: Insufficient documentation

## 2022-12-31 DIAGNOSIS — C50512 Malignant neoplasm of lower-outer quadrant of left female breast: Secondary | ICD-10-CM | POA: Insufficient documentation

## 2022-12-31 NOTE — Assessment & Plan Note (Signed)
10/01/2020:Screening mammogram showed a left breast mass and calcifications. Diagnostic mammogram and US showed a 1.7cm and 0.5cm mass at the 5 o'clock position in the left breast, with one 0.4cm abnormal right axillary lymph node. 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   Treatment Plan: 1. Neoadjuvant chemotherapy with [facility] Perjeta 6 cycles followed by Herceptin Perjeta maintenance for 1 year completed June 2023 2. 03/12/2021: Pathologic complete response, 0/6 lymph nodes negative 3. Followed by adjuvant radiation therapy completed 05/30/2021 4.  Followed by antiestrogen therapy although ER is weak URCC nausea study Breast MRI: 10/17/20: 2.4 cm left breast mass, intramammary lymph node ------------------------------------------------------------------------------------------------------------------------- Severe hot flashes: Menopausal symptoms. On Effexor.  If her hot flashes improve from severe to mild to moderate and we can initiate antiestrogen therapy. Chemo-induced peripheral neuropathy: Participated in clinical trial with [facility]A: Did not notice any improvement   Current treatment: Anastrozole 1 mg daily started 10/28/2021 stopped 05/14/22 switched to letrozole 05/26/2022 (due to diffuse muscle aches and pains and hot flashes)   Letrozole toxicities: Tolerating it much better than the last. Hot flashes; still significant   Peripheral neuropathy: Continues to remain.  Nothing appears to make it any better.   Breast cancer surveillance: 1.  Breast exam 12/31/2022: Benign 2. mammogram 01/20/2022: Stable postoperative changes 3.  Bone density 12/22/2021: T score -1.3 (mild osteopenia)  Return to clinic in 1 year for follow-up

## 2023-01-01 LAB — FOLLICLE STIMULATING HORMONE: FSH: 49.1 m[IU]/mL

## 2023-01-03 NOTE — Progress Notes (Signed)
HEMATOLOGY-ONCOLOGY TELEPHONE VISIT PROGRESS NOTE  I connected with our patient on 01/05/23 at 11:30 AM EDT by telephone and verified that I am speaking with the correct person using two identifiers.  I discussed the limitations, risks, security and privacy concerns of performing an evaluation and management service by telephone and the availability of in person appointments.  I also discussed with the patient that there may be a patient responsible charge related to this service. The patient expressed understanding and agreed to proceed.   History of Present Illness: Telephone visit follow-up to discuss results of blood testing.  I connected with the patient using our Spanish interpreter.  Oncology History  Malignant neoplasm of lower-outer quadrant of left breast of female, estrogen receptor positive (HCC)  10/01/2020 Initial Diagnosis   Screening mammogram showed a left breast mass and calcifications. Diagnostic mammogram and US showed a 1.7cm and 0.5cm mass at the 5 o'clock position in the left breast, with one 0.4cm abnormal right axillary lymph node. 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   10/09/2020 Cancer Staging   Staging form: Breast, AJCC 8th Edition - Clinical stage from 10/09/2020: Stage IB (cT2, cN1, cM0, G3, ER+, PR+, HER2+) - Signed by Serena Croissant, MD on 10/09/2020 Stage prefix: Initial diagnosis Histologic grading system: 3 grade system Percentage of positive estrogen receptors (%): 5 Percentage of positive progesterone receptors (%): 1 Ki-67 (%): 20   10/18/2020 - 02/27/2021 Chemotherapy   Patient is on Treatment Plan : BREAST  Docetaxel + Carboplatin + Trastuzumab + Pertuzumab  (TCHP) q21d      10/23/2020 Genetic Testing   Negative genetic testing:  No pathogenic variants detected on the [employer] CancerNext-Expanded + RNAinsight panel. The report date is 10/23/2020.   The CancerNext-Expanded + RNAinsight  gene panel offered by [employer] and includes sequencing and rearrangement analysis for the following 77 genes: AIP, ALK, APC, ATM, AXIN2, BAP1, BARD1, BLM, BMPR1A, BRCA1, BRCA2, BRIP1, CDC73, CDH1, CDK4, CDKN1B, CDKN2A, CHEK2, CTNNA1, DICER1, FANCC, FH, FLCN, GALNT12, KIF1B, LZTR1, MAX, MEN1, MET, MLH1, MSH2, MSH3, MSH6, MUTYH, NBN, NF1, NF2, NTHL1, PALB2, PHOX2B, PMS2, POT1, PRKAR1A, PTCH1, PTEN, RAD51C, RAD51D, RB1, RECQL, RET, SDHA, SDHAF2, SDHB, SDHC, SDHD, SMAD4, SMARCA4, SMARCB1, SMARCE1, STK11, SUFU, TMEM127, TP53, TSC1, TSC2, VHL and XRCC2 (sequencing and deletion/duplication); EGFR, EGLN1, HOXB13, KIT, MITF, PDGFRA, POLD1 and POLE (sequencing only); EPCAM and GREM1 (deletion/duplication only). RNA data is routinely analyzed for use in variant interpretation for all genes.   03/31/2021 - 10/28/2021 Chemotherapy   Patient is on Treatment Plan : BREAST Trastuzumab  + Pertuzumab q21d x 13 cycles     04/15/2021 - 05/30/2021 Radiation Therapy   Site Technique Total Dose (Gy) Dose per Fx (Gy) Completed Fx Beam Energies  Breast, Left: Breast_Lt 3D 50.4/50.4 1.8 28/28 6X  Axilla, Left: Axilla_Lt 3D 50.4/50.4 1.8 28/28 6X, 10X       REVIEW OF SYSTEMS:   Constitutional: Denies fevers, chills or abnormal weight loss All other systems were reviewed with the patient and are negative. Observations/Objective:     Assessment Plan:  Malignant neoplasm of lower-outer quadrant of left breast of female, estrogen receptor positive (HCC) 10/01/2020:Screening mammogram showed a left breast mass and calcifications. Diagnostic mammogram and US showed a 1.7cm and 0.5cm mass at the 5 o'clock position in the left breast, with one 0.4cm abnormal right axillary lymph node. Biopsy showed invasive and in situ ductal carcinoma, grade 3, HER-2 positive (3+), ER+  5% weak, PR+ 1%, Ki67 20%.   Treatment Plan: 1. Neoadjuvant chemotherapy with [facility] Perjeta 6 cycles followed by Herceptin Perjeta maintenance for 1 year  completed June 2023 2. 03/12/2021: Pathologic complete response, 0/6 lymph nodes negative 3. Followed by adjuvant radiation therapy completed 05/30/2021 4.  Followed by antiestrogen therapy although ER is weak URCC nausea study Breast MRI: 10/17/20: 2.4 cm left breast mass, intramammary lymph node ------------------------------------------------------------------------------------------------------------------------- Severe hot flashes: Menopausal symptoms. On Effexor.  If her hot flashes improve from severe to mild to moderate and we can initiate antiestrogen therapy. Chemo-induced peripheral neuropathy: Participated in clinical trial with [facility]: Did not notice any improvement   Current treatment: Anastrozole 1 mg daily started 10/28/2021 stopped 05/14/22 switched to letrozole 05/26/2022 (due to diffuse muscle aches and pains and hot flashes)   Letrozole toxicities: Tolerating it much better than the last. Hot flashes; still significant 3.  Weight gain issues: She is watching her diet and trying to lose a few pounds.   Breast cancer surveillance: 1.  Breast exam 12/31/2022: Benign 2. mammogram 01/20/2022: Stable postoperative changes 3.  Bone density 12/22/2021: T score -1.3 (mild osteopenia)   Uterine bleeding: Unclear etiology.  Last year we have checked twice for menopause (due to uterine fibroids) FSH: 49.1 (menopausal range) Estradiol: < 2.5  I discussed with the patient that she appears to be in menopause by lab criteria. Continue with letrozole and return to see me back in 1 year.  I discussed the assessment and treatment plan with the patient. The patient was provided an opportunity to ask questions and all were answered. The patient agreed with the plan and demonstrated an understanding of the instructions. The patient was advised to call back or seek an in-person evaluation if the symptoms worsen or if the condition fails to improve as anticipated.   I provided 12 minutes of  non-face-to-face time during this encounter.  This includes time for charting and coordination of care   Tamsen Meek, MD  I Janan Ridge am acting as a scribe for Dr.Vinay Gudena  I have reviewed the above documentation for accuracy and completeness, and I agree with the above.

## 2023-01-05 ENCOUNTER — Inpatient Hospital Stay (HOSPITAL_BASED_OUTPATIENT_CLINIC_OR_DEPARTMENT_OTHER): Payer: No Typology Code available for payment source | Admitting: Hematology and Oncology

## 2023-01-05 DIAGNOSIS — C50512 Malignant neoplasm of lower-outer quadrant of left female breast: Secondary | ICD-10-CM | POA: Diagnosis not present

## 2023-01-05 DIAGNOSIS — Z170 Estrogen receptor positive status [ER+]: Secondary | ICD-10-CM | POA: Diagnosis not present

## 2023-01-05 LAB — ESTRADIOL, ULTRA SENS: Estradiol, Sensitive: <2.5 pg/mL

## 2023-01-05 NOTE — Assessment & Plan Note (Signed)
10/01/2020:Screening mammogram showed a left breast mass and calcifications. Diagnostic mammogram and US showed a 1.7cm and 0.5cm mass at the 5 o'clock position in the left breast, with one 0.4cm abnormal right axillary lymph node. 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   Treatment Plan: 1. Neoadjuvant chemotherapy with [facility] Perjeta 6 cycles followed by Herceptin Perjeta maintenance for 1 year completed June 2023 2. 03/12/2021: Pathologic complete response, 0/6 lymph nodes negative 3. Followed by adjuvant radiation therapy completed 05/30/2021 4.  Followed by antiestrogen therapy although ER is weak URCC nausea study Breast MRI: 10/17/20: 2.4 cm left breast mass, intramammary lymph node ------------------------------------------------------------------------------------------------------------------------- Severe hot flashes: Menopausal symptoms. On Effexor.  If her hot flashes improve from severe to mild to moderate and we can initiate antiestrogen therapy. Chemo-induced peripheral neuropathy: Participated in clinical trial with [facility]A: Did not notice any improvement   Current treatment: Anastrozole 1 mg daily started 10/28/2021 stopped 05/14/22 switched to letrozole 05/26/2022 (due to diffuse muscle aches and pains and hot flashes)   Letrozole toxicities: Tolerating it much better than the last. Hot flashes; still significant 3.  Weight gain issues: She is watching her diet and trying to lose a few pounds.   Breast cancer surveillance: 1.  Breast exam 12/31/2022: Benign 2. mammogram 01/20/2022: Stable postoperative changes 3.  Bone density 12/22/2021: T score -1.3 (mild osteopenia)   Uterine bleeding: Unclear etiology.  Last year we have checked twice for menopause  FSH: 49.1 (menopausal range) Estradiol: Pending  I discussed with the patient that she appears to be in menopause by lab criteria.

## 2023-02-19 ENCOUNTER — Ambulatory Visit
Admission: RE | Admit: 2023-02-19 | Discharge: 2023-02-19 | Disposition: A | Discharge status: Home / Self Care | Payer: No Typology Code available for payment source | Source: Ambulatory Visit | Attending: Hematology and Oncology | Admitting: Hematology and Oncology

## 2023-02-19 DIAGNOSIS — Z9889 Other specified postprocedural states: Secondary | ICD-10-CM

## 2023-03-29 ENCOUNTER — Ambulatory Visit: Payer: No Typology Code available for payment source | Attending: Surgery

## 2023-03-29 VITALS — Wt 166.1 lb

## 2023-03-29 DIAGNOSIS — Z483 Aftercare following surgery for neoplasm: Secondary | ICD-10-CM | POA: Insufficient documentation

## 2023-03-29 NOTE — Therapy (Signed)
OUTPATIENT PHYSICAL THERAPY SOZO SCREENING NOTE   Patient Name: Tammie [name] MRN: [id number] DOB:[birth year], 47 y.o., female Today's Date: 03/29/2023  PCP: Jerrye Bushy, FNP REFERRING PROVIDER: Manus Rudd, MD   PT End of Session - 03/29/23 1558     Visit Number 2   # unchanged due to screen only   PT Start Time 1555    PT Stop Time 1559    PT Time Calculation (min) 4 min    Activity Tolerance Patient tolerated treatment well    Behavior During Therapy WFL for tasks assessed/performed             Past Medical History:  Diagnosis Date   Anxiety    Arrhythmia    palpitation   Breast cancer (HCC)    Depression    Family history of breast cancer    Family history of leukemia    Family history of prostate cancer    Family history of stomach cancer    Fibroid, uterine    Fibromyalgia    History of radiation therapy    Left breast- 04/15/21-05/30/21- Dr. Antony Blackbird   Hx of degenerative disc disease    Hypertension    Interstitial cystitis    Microadenoma    Migraines    PONV (postoperative nausea and vomiting)    Pre-diabetes    Past Surgical History:  Procedure Laterality Date   BREAST LUMPECTOMY Left 03/12/2021   BREAST LUMPECTOMY WITH RADIOACTIVE SEED AND SENTINEL LYMPH NODE BIOPSY Left 03/12/2021   Procedure: LEFT BREAST LUMPECTOMY WITH RADIOACTIVE SEED X2 AND LEFT SENTINEL LYMPH NODE BIOPSY;  Surgeon: Manus Rudd, MD;  Location: [facility];  Service: General;  Laterality: Left;   CHOLECYSTECTOMY     PORT-A-CATH REMOVAL Right 12/02/2021   Procedure: REMOVAL PORT-A-CATH;  Surgeon: Manus Rudd, MD;  Location: [facility];  Service: General;  Laterality: Right;   PORTACATH PLACEMENT Right 10/17/2020   Procedure: INSERTION PORT-A-CATH, ULTRASOUND GUIDED;  Surgeon: Manus Rudd, MD;  Location: [facility];  Service: General;  Laterality: Right;   RADIOACTIVE SEED GUIDED AXILLARY SENTINEL LYMPH NODE Left  03/12/2021   Procedure: RADIOACTIVE SEED GUIDED LEFT AXILLARY SENTINEL LYMPH NODE BIOPSY;  Surgeon: Manus Rudd, MD;  Location: [facility];  Service: General;  Laterality: Left;   Patient Active Problem List   Diagnosis Date Noted   Port-A-Cath in place 12/20/2020   Genetic testing 10/23/2020   Family history of breast cancer    Family history of prostate cancer    Family history of stomach cancer    Family history of leukemia    Malignant neoplasm of lower-outer quadrant of left breast of female, estrogen receptor positive (HCC) 10/03/2020    REFERRING DIAG: left breast cancer at risk for lymphedema  THERAPY DIAG: Aftercare following surgery for neoplasm  PERTINENT HISTORY:  Patient was diagnosed on 08/20/2020 with 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 It is ER/PR weakly positive and HER2 positive with a Ki67 of 20%.            PRECAUTIONS: left UE Lymphedema risk  SUBJECTIVE: Pt returns for her last 3 month L-Dex screen.   PAIN:  Are you having pain? No  SOZO SCREENING: Patient was assessed today using the SOZO machine to determine the lymphedema index score. This was compared to her baseline score. It was  determined that she is within the recommended range when compared to her baseline and no further action is needed at this time. She will continue SOZO screenings. These are done every 3 months for 2 years post operatively followed by every 6 months for 2 years, and then annually.   L-DEX FLOWSHEETS - 03/29/23 1500       L-DEX LYMPHEDEMA SCREENING   Measurement Type Unilateral    L-DEX MEASUREMENT EXTREMITY Upper Extremity    POSITION  Standing    DOMINANT SIDE Right    At Risk Side Left    BASELINE SCORE (UNILATERAL) -1.1    L-DEX SCORE (UNILATERAL) 0.2    VALUE CHANGE (UNILAT)  1.3             P: Begin 6 month L-Dex screens next.    Berna Spare, PTA 03/29/23 3:59 PM

## 2023-03-31 LAB — HM PAP SMEAR: HM Pap smear: NEGATIVE

## 2023-04-02 ENCOUNTER — Encounter: Payer: Self-pay | Admitting: Hematology and Oncology

## 2023-04-02 NOTE — Telephone Encounter (Signed)
TC

## 2023-07-14 ENCOUNTER — Other Ambulatory Visit: Payer: Self-pay | Admitting: Hematology and Oncology

## 2023-09-27 ENCOUNTER — Ambulatory Visit: Payer: No Typology Code available for payment source | Attending: Surgery

## 2023-09-27 VITALS — Wt 172.1 lb

## 2023-09-27 DIAGNOSIS — Z483 Aftercare following surgery for neoplasm: Secondary | ICD-10-CM | POA: Insufficient documentation

## 2023-09-27 NOTE — Therapy (Signed)
 OUTPATIENT PHYSICAL THERAPY SOZO SCREENING NOTE   Patient Name: Tammie [name] MRN: [id number] DOB:16-Aug-1975, 48 y.o., female Today's Date: 09/27/2023  PCP: Alveta Joiner, FNP REFERRING PROVIDER: Dareen Ebbing, MD   PT End of Session - 09/27/23 1608     Visit Number 2   # unchanged due to screen only   PT Start Time 1604    PT Stop Time 1608    PT Time Calculation (min) 4 min    Activity Tolerance Patient tolerated treatment well    Behavior During Therapy WFL for tasks assessed/performed             Past Medical History:  Diagnosis Date   Anxiety    Arrhythmia    palpitation   Breast cancer (HCC)    Depression    Family history of breast cancer    Family history of leukemia    Family history of prostate cancer    Family history of stomach cancer    Fibroid, uterine    Fibromyalgia    History of radiation therapy    Left breast- 04/15/21-05/30/21- Dr. Retta Caster   Hx of degenerative disc disease    Hypertension    Interstitial cystitis    Microadenoma    Migraines    PONV (postoperative nausea and vomiting)    Pre-diabetes    Past Surgical History:  Procedure Laterality Date   BREAST LUMPECTOMY Left 03/12/2021   BREAST LUMPECTOMY WITH RADIOACTIVE SEED AND SENTINEL LYMPH NODE BIOPSY Left 03/12/2021   Procedure: LEFT BREAST LUMPECTOMY WITH RADIOACTIVE SEED X2 AND LEFT SENTINEL LYMPH NODE BIOPSY;  Surgeon: Dareen Ebbing, MD;  Location: [facility];  Service: General;  Laterality: Left;   CHOLECYSTECTOMY     PORT-A-CATH REMOVAL Right 12/02/2021   Procedure: REMOVAL PORT-A-CATH;  Surgeon: Dareen Ebbing, MD;  Location: [facility];  Service: General;  Laterality: Right;   PORTACATH PLACEMENT Right 10/17/2020   Procedure: INSERTION PORT-A-CATH, ULTRASOUND GUIDED;  Surgeon: Dareen Ebbing, MD;  Location: [facility];  Service: General;  Laterality: Right;   RADIOACTIVE SEED GUIDED AXILLARY SENTINEL LYMPH NODE Left  03/12/2021   Procedure: RADIOACTIVE SEED GUIDED LEFT AXILLARY SENTINEL LYMPH NODE BIOPSY;  Surgeon: Dareen Ebbing, MD;  Location: [facility];  Service: General;  Laterality: Left;   Patient Active Problem List   Diagnosis Date Noted   Port-A-Cath in place 12/20/2020   Genetic testing 10/23/2020   Family history of breast cancer    Family history of prostate cancer    Family history of stomach cancer    Family history of leukemia    Malignant neoplasm of lower-outer quadrant of left breast of female, estrogen receptor positive (HCC) 10/03/2020    REFERRING DIAG: left breast cancer at risk for lymphedema  THERAPY DIAG: Aftercare following surgery for neoplasm  PERTINENT HISTORY:  Patient was diagnosed on 08/20/2020 with 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 It is ER/PR weakly positive and HER2 positive with a Ki67 of 20%.            PRECAUTIONS: left UE Lymphedema risk  SUBJECTIVE: Pt returns for her last 3 month L-Dex screen.   PAIN:  Are you having pain? No  SOZO SCREENING: Patient was assessed today using the SOZO machine to determine the lymphedema index score. This was compared to her baseline score. It was  determined that she is within the recommended range when compared to her baseline and no further action is needed at this time. She will continue SOZO screenings. These are done every 3 months for 2 years post operatively followed by every 6 months for 2 years, and then annually.   L-DEX FLOWSHEETS - 09/27/23 1600       L-DEX LYMPHEDEMA SCREENING   Measurement Type Unilateral    L-DEX MEASUREMENT EXTREMITY Upper Extremity    POSITION  Standing    DOMINANT SIDE Right    At Risk Side Left    BASELINE SCORE (UNILATERAL) -1.1    L-DEX SCORE (UNILATERAL) 0    VALUE CHANGE (UNILAT) 1.1              P: Cont 6 month L-Dex screens next.    Roslynn Coombes, PTA 09/27/23 4:09 PM

## 2024-01-03 NOTE — Assessment & Plan Note (Signed)
 10/01/2020:Screening mammogram showed a left breast mass and calcifications. Diagnostic mammogram and US  showed a 1.7cm and 0.5cm mass at the 5 o'clock position in the left breast, with one 0.4cm abnormal right axillary lymph node. 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   Treatment Plan: 1. Neoadjuvant chemotherapy with [facility] Perjeta  6 cycles followed by Herceptin  Perjeta  maintenance for 1 year completed June 2023 2. 03/12/2021: Pathologic complete response, 0/6 lymph nodes negative 3. Followed by adjuvant radiation therapy completed 05/30/2021 4.  Followed by antiestrogen therapy although ER is weak URCC nausea study Breast MRI: 10/17/20: 2.4 cm left breast mass, intramammary lymph node ------------------------------------------------------------------------------------------------------------------------- Severe hot flashes: Menopausal symptoms. On Effexor .  If her hot flashes improve from severe to mild to moderate and we can initiate antiestrogen therapy. Chemo-induced peripheral neuropathy: Participated in clinical trial with PEA: Did not notice any improvement   Current treatment: Anastrozole  1 mg daily started 10/28/2021 stopped 05/14/22 switched to letrozole  05/26/2022 (due to diffuse muscle aches and pains and hot flashes)   Letrozole  toxicities: Tolerating it much better than the last. Hot flashes; still significant 3.  Weight gain issues: She is watching her diet and trying to lose a few pounds.   Breast cancer surveillance: 1.  Breast exam 01/04/2024: Benign 2. mammogram 02/19/23: Stable postoperative changes 3.  Bone density 12/22/2021: T score -1.3 (mild osteopenia)   Uterine bleeding: Unclear etiology.  Last year we have checked twice for menopause (due to uterine fibroids) FSH: 49.1 (menopausal range) Estradiol : < 2.5

## 2024-01-04 ENCOUNTER — Inpatient Hospital Stay
Payer: No Typology Code available for payment source | Attending: Hematology and Oncology | Admitting: Hematology and Oncology

## 2024-01-04 VITALS — BP 129/60 | HR 71 | Temp 97.7°F | Resp 16 | Ht 63.0 in | Wt 159.1 lb

## 2024-01-04 DIAGNOSIS — Z923 Personal history of irradiation: Secondary | ICD-10-CM | POA: Insufficient documentation

## 2024-01-04 DIAGNOSIS — C50512 Malignant neoplasm of lower-outer quadrant of left female breast: Secondary | ICD-10-CM | POA: Diagnosis present

## 2024-01-04 DIAGNOSIS — Z79811 Long term (current) use of aromatase inhibitors: Secondary | ICD-10-CM | POA: Diagnosis not present

## 2024-01-04 DIAGNOSIS — Z9221 Personal history of antineoplastic chemotherapy: Secondary | ICD-10-CM | POA: Diagnosis not present

## 2024-01-04 DIAGNOSIS — Z1732 Human epidermal growth factor receptor 2 negative status: Secondary | ICD-10-CM | POA: Insufficient documentation

## 2024-01-04 DIAGNOSIS — Z170 Estrogen receptor positive status [ER+]: Secondary | ICD-10-CM | POA: Insufficient documentation

## 2024-01-04 DIAGNOSIS — Z1721 Progesterone receptor positive status: Secondary | ICD-10-CM | POA: Insufficient documentation

## 2024-01-04 NOTE — Progress Notes (Signed)
 Patient Care Team: Pcp, No as PCP - General Belinda Cough, MD as Consulting Physician (General Surgery) Odean Potts, MD as Consulting Physician (Hematology and Oncology) Shannon Agent, MD as Consulting Physician (Radiation Oncology)  DIAGNOSIS:  Encounter Diagnosis  Name Primary?   Malignant neoplasm of lower-outer quadrant of left breast of female, estrogen receptor positive (HCC) Yes    SUMMARY OF ONCOLOGIC HISTORY: Oncology History  Malignant neoplasm of lower-outer quadrant of left breast of female, estrogen receptor positive (HCC)  10/01/2020 Initial Diagnosis   Screening mammogram showed a left breast mass and calcifications. Diagnostic mammogram and US  showed a 1.7cm and 0.5cm mass at the 5 o'clock position in the left breast, with one 0.4cm abnormal right axillary lymph node. 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   10/09/2020 Cancer Staging   Staging form: Breast, AJCC 8th Edition - Clinical stage from 10/09/2020: Stage IB (cT2, cN1, cM0, G3, ER+, PR+, HER2+) - Signed by Odean Potts, MD on 10/09/2020 Stage prefix: Initial diagnosis Histologic grading system: 3 grade system Percentage of positive estrogen receptors (%): 5 Percentage of positive progesterone receptors (%): 1 Ki-67 (%): 20   10/18/2020 - 02/27/2021 Chemotherapy   Patient is on Treatment Plan : BREAST  Docetaxel  + Carboplatin  + Trastuzumab  + Pertuzumab   (TCHP) q21d      10/23/2020 Genetic Testing   Negative genetic testing:  No pathogenic variants detected on the [employer] CancerNext-Expanded + RNAinsight panel. The report date is 10/23/2020.   The CancerNext-Expanded + RNAinsight gene panel offered by [employer] and includes sequencing and rearrangement analysis for the following 77 genes: AIP, ALK, APC, ATM, AXIN2, BAP1, BARD1, BLM, BMPR1A, BRCA1, BRCA2, BRIP1, CDC73, CDH1, CDK4, CDKN1B, CDKN2A, CHEK2, CTNNA1, DICER1, FANCC, FH, FLCN,  GALNT12, KIF1B, LZTR1, MAX, MEN1, MET, MLH1, MSH2, MSH3, MSH6, MUTYH, NBN, NF1, NF2, NTHL1, PALB2, PHOX2B, PMS2, POT1, PRKAR1A, PTCH1, PTEN, RAD51C, RAD51D, RB1, RECQL, RET, SDHA, SDHAF2, SDHB, SDHC, SDHD, SMAD4, SMARCA4, SMARCB1, SMARCE1, STK11, SUFU, TMEM127, TP53, TSC1, TSC2, VHL and XRCC2 (sequencing and deletion/duplication); EGFR, EGLN1, HOXB13, KIT, MITF, PDGFRA, POLD1 and POLE (sequencing only); EPCAM and GREM1 (deletion/duplication only). RNA data is routinely analyzed for use in variant interpretation for all genes.   03/31/2021 - 10/28/2021 Chemotherapy   Patient is on Treatment Plan : BREAST Trastuzumab   + Pertuzumab  q21d x 13 cycles     04/15/2021 - 05/30/2021 Radiation Therapy   Site Technique Total Dose (Gy) Dose per Fx (Gy) Completed Fx Beam Energies  Breast, Left: Breast_Lt 3D 50.4/50.4 1.8 28/28 6X  Axilla, Left: Axilla_Lt 3D 50.4/50.4 1.8 28/28 6X, 10X       CHIEF COMPLIANT: Surveillance of breast cancer  HISTORY OF PRESENT ILLNESS:   History of Present Illness Tammie [name] is a 48 year old female with a history of breast cancer who presents for follow-up regarding her breast cancer history.  She tolerates gliprozole well without significant side effects. Her previous menstrual bleeding issue has resolved, and she has not experienced any recent menstrual cycles. She experiences hot flashes but has no breast pain or discomfort. Her last mammogram was performed in September. She is concerned about the possibility of cancer recurrence in other parts of her body without symptoms.     ALLERGIES:  has no known allergies.  MEDICATIONS:  Current Outpatient Medications  Medication Sig Dispense Refill   ascorbic acid (VITAMIN C) 1000 MG tablet Take 1,000 mg by mouth daily.  BEE POLLEN PO Take 500 mg by mouth 2 (two) times daily. 1 in the morning and 2 at night     Cholecalciferol (DIALYVITE VITAMIN D 5000) 125 MCG (5000 UT) capsule Take 5,000 Units by mouth daily.      Coral Calcium 1000 (390 Ca) MG TABS Take 1 capsule by mouth 2 (two) times daily.     ibuprofen  (ADVIL ) 800 MG tablet Take 800 mg by mouth 2 (two) times daily as needed.     letrozole  (FEMARA ) 2.5 MG tablet TAKE 1 TABLET(2.5 MG) BY MOUTH DAILY 90 tablet 3   Magnesium  500 MG CAPS Take 1 capsule (500 mg total) by mouth daily.  0   metoprolol  tartrate (LOPRESSOR ) 25 MG tablet Take 25 mg by mouth daily.     NON FORMULARY Take 250 mg by mouth daily. Hyaluronic Acid Complex     Omega-3 Fatty Acids (OMEGA 3 500 PO) Take 500 mg by mouth daily.     OVER THE COUNTER MEDICATION Take 2 capsules by mouth 2 (two) times daily. Laminine supplement- 620 mg     No current facility-administered medications for this visit.    PHYSICAL EXAMINATION: ECOG PERFORMANCE STATUS: 1 - Symptomatic but completely ambulatory  Vitals:   01/04/24 1000  BP: 129/60  Pulse: 71  Resp: 16  Temp: 97.7 F (36.5 C)  SpO2: 100%   Filed Weights   01/04/24 1000  Weight: 159 lb 1.6 oz (72.2 kg)      LABORATORY DATA:  I have reviewed the data as listed    Latest Ref Rng & Units 12/30/2021    8:31 AM 11/27/2021    1:50 PM 11/14/2021    2:03 PM  CMP  Glucose 70 - 99 mg/dL 85  897  893   BUN 6 - 20 mg/dL 25  15  18    Creatinine 0.44 - 1.00 mg/dL 9.28  9.31  9.25   Sodium 135 - 145 mmol/L 139  142  138   Potassium 3.5 - 5.1 mmol/L 3.7  4.0  3.0   Chloride 98 - 111 mmol/L 104  103  104   CO2 22 - 32 mmol/L 28  27  26    Calcium 8.9 - 10.3 mg/dL 9.9  9.9  9.1   Total Protein 6.5 - 8.1 g/dL 7.2   7.3   Total Bilirubin 0.3 - 1.2 mg/dL 0.2   0.5   Alkaline Phos 38 - 126 U/L 65   56   AST 15 - 41 U/L 21   86   ALT 0 - 44 U/L 30   155     Lab Results  Component Value Date   WBC 5.0 12/30/2021   HGB 12.6 12/30/2021   HCT 36.6 12/30/2021   MCV 87.4 12/30/2021   PLT 188 12/30/2021   NEUTROABS 2.8 12/30/2021    ASSESSMENT & PLAN:  Malignant neoplasm of lower-outer quadrant of left breast of female, estrogen receptor  positive (HCC) 10/01/2020:Screening mammogram showed a left breast mass and calcifications. Diagnostic mammogram and US  showed a 1.7cm and 0.5cm mass at the 5 o'clock position in the left breast, with one 0.4cm abnormal right axillary lymph node. 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   Treatment Plan: 1. Neoadjuvant chemotherapy with [facility] Perjeta  6 cycles followed by Herceptin  Perjeta  maintenance for 1 year completed June 2023 2. 03/12/2021: Pathologic complete response, 0/6 lymph nodes negative 3. Followed by adjuvant radiation  therapy completed 05/30/2021 4.  Followed by antiestrogen therapy although ER is weak URCC nausea study Breast MRI: 10/17/20: 2.4 cm left breast mass, intramammary lymph node ------------------------------------------------------------------------------------------------------------------------- Severe hot flashes: Menopausal symptoms. On Effexor .  If her hot flashes improve from severe to mild to moderate and we can initiate antiestrogen therapy. Chemo-induced peripheral neuropathy: Participated in clinical trial with PEA: Did not notice any improvement   Current treatment: Anastrozole  1 mg daily started 10/28/2021 stopped 05/14/22 switched to letrozole  05/26/2022 (due to diffuse muscle aches and pains and hot flashes)   Letrozole  toxicities: Tolerating it much better than the last. Hot flashes; still significant 3.  Weight gain issues: She is watching her diet and trying to lose a few pounds.   Breast cancer surveillance: 1.  mammogram 02/19/23: Stable postoperative changes 2.  Bone density 12/22/2021: T score -1.3 (mild osteopenia)   Uterine bleeding: Unclear etiology.  FSH: 49.1 (menopausal range) Estradiol : < 2.5 No further issues with uterine bleeding. ------------------------------------- Assessment and Plan Assessment & Plan Malignant neoplasm of lower-outer quadrant of left breast,  estrogen receptor positive Currently experiencing severe hot flashes. Mammograms showed no abnormalities. Discussed GARDENT blood test for early cancer recurrence detection. Emphasized low recurrence rate and importance of regular monitoring. - Order GARDENT blood test every six months. - Continue annual mammograms in September. - Encourage 30 minutes of brisk walking daily. - Advise reducing alcohol and red meat, increasing fruits and vegetables intake.  Severe hot flashes Hot flashes present but not discussed further.  History of uterine bleeding No current uterine bleeding. Issue resolved after gynecologist consultation.      No orders of the defined types were placed in this encounter.  The patient has a good understanding of the overall plan. she agrees with it. she will call with any problems that may develop before the next visit here. Total time spent: 30 mins including face to face time and time spent for planning, charting and co-ordination of care   Naomi MARLA Chad, MD 01/04/24

## 2024-01-17 ENCOUNTER — Other Ambulatory Visit: Payer: Self-pay | Admitting: Hematology and Oncology

## 2024-01-17 DIAGNOSIS — Z1231 Encounter for screening mammogram for malignant neoplasm of breast: Secondary | ICD-10-CM

## 2024-01-19 ENCOUNTER — Encounter (HOSPITAL_BASED_OUTPATIENT_CLINIC_OR_DEPARTMENT_OTHER): Payer: Self-pay | Admitting: Student

## 2024-01-19 ENCOUNTER — Ambulatory Visit (HOSPITAL_BASED_OUTPATIENT_CLINIC_OR_DEPARTMENT_OTHER): Admitting: Student

## 2024-01-19 VITALS — BP 121/79 | HR 76 | Temp 98.0°F | Resp 16 | Ht 61.81 in | Wt 159.0 lb

## 2024-01-19 DIAGNOSIS — Z13220 Encounter for screening for lipoid disorders: Secondary | ICD-10-CM

## 2024-01-19 DIAGNOSIS — C50512 Malignant neoplasm of lower-outer quadrant of left female breast: Secondary | ICD-10-CM | POA: Diagnosis not present

## 2024-01-19 DIAGNOSIS — L2082 Flexural eczema: Secondary | ICD-10-CM | POA: Diagnosis not present

## 2024-01-19 DIAGNOSIS — L0291 Cutaneous abscess, unspecified: Secondary | ICD-10-CM | POA: Insufficient documentation

## 2024-01-19 DIAGNOSIS — Z136 Encounter for screening for cardiovascular disorders: Secondary | ICD-10-CM

## 2024-01-19 DIAGNOSIS — Z7689 Persons encountering health services in other specified circumstances: Secondary | ICD-10-CM

## 2024-01-19 DIAGNOSIS — Z131 Encounter for screening for diabetes mellitus: Secondary | ICD-10-CM

## 2024-01-19 DIAGNOSIS — Z1211 Encounter for screening for malignant neoplasm of colon: Secondary | ICD-10-CM

## 2024-01-19 DIAGNOSIS — Z170 Estrogen receptor positive status [ER+]: Secondary | ICD-10-CM

## 2024-01-19 MED ORDER — TRIAMCINOLONE ACETONIDE 0.1 % EX CREA: 1.0000 | TOPICAL_CREAM | Freq: Two times a day (BID) | CUTANEOUS | 0 refills | Status: AC

## 2024-01-19 NOTE — Patient Instructions (Signed)
 It was nice to see you today!  If you have any problems before your next visit feel free to message me via MyChart (minor issues or questions) or call the office, otherwise you may reach out to schedule an office visit.  Thank you! Pau Banh, PA-C

## 2024-01-19 NOTE — Progress Notes (Signed)
 New Patient Office Visit  Subjective    Patient ID: Tammie [name], female    DOB: [birth year]  Age: 48 y.o. MRN: [id number]  CC:  Chief Complaint  Patient presents with   Establish Care    Here to establish care.   infection    Has been taking antibiotics for an abscess on her right side of abdomen.     Discussed the use of AI scribe software for clinical note transcription with the patient, who gave verbal consent to proceed.  History of Present Illness   Tammie [name] is a 48 year old female who presents with an abdominal abscess.  She has an abscess on the right side of her abdomen. She was prescribed clindamycin by an urgent care provider and completed a 10-day course, taking it every six hours. She expressed the contents of the abscess herself but is concerned about potential infection.  She has a history of breast cancer in the left breast, lower outer quadrant, which was estrogen receptor positive. She is currently on letrozole  and experiences hot flashes. She is managing her weight by trying to lose weight and reports she is already losing weight. She is managing her weight gain by trying to lose weight. She follows up with Dr. Gudina for her breast cancer management.  She reports a rash around her eyes and ears, particularly when stressed. The rash is itchy but not painful. 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  She has a history of prediabetes diagnosed in April and is not currently on any diabetes medication. No history of diabetes or high cholesterol.  She is on metoprolol  for high blood pressure. No smoking or vaping.      Screenings:  Colon Cancer: indicated Lung Cancer: N/A Breast Cancer: history of the same- mammo next month. Cervical Cancer: Up to date  Diabetes: indicated- prediabetic HLD: indicated   Outpatient Encounter Medications as of 01/19/2024  Medication  Sig   ascorbic acid (VITAMIN C) 1000 MG tablet Take 1,000 mg by mouth daily.   Cholecalciferol (DIALYVITE VITAMIN D 5000) 125 MCG (5000 UT) capsule Take 5,000 Units by mouth daily.   clindamycin (CLEOCIN) 300 MG capsule Take 300 mg by mouth every 6 (six) hours.   Coral Calcium 1000 (390 Ca) MG TABS Take 1 capsule by mouth 2 (two) times daily.   ibuprofen  (ADVIL ) 800 MG tablet Take 800 mg by mouth 2 (two) times daily as needed.   letrozole  (FEMARA ) 2.5 MG tablet TAKE 1 TABLET(2.5 MG) BY MOUTH DAILY   Magnesium  500 MG CAPS Take 1 capsule (500 mg total) by mouth daily.   metoprolol  tartrate (LOPRESSOR ) 25 MG tablet Take 25 mg by mouth daily.   NON FORMULARY Take 250 mg by mouth daily. Hyaluronic Acid Complex   Omega-3 Fatty Acids (OMEGA 3 500 PO) Take 500 mg by mouth daily.   OVER THE COUNTER MEDICATION Take 2 capsules by mouth 2 (two) times daily. Laminine supplement- 620 mg   triamcinolone  cream (KENALOG ) 0.1 % Apply 1 Application topically 2 (two) times daily.   VALERIAN ROOT PO Take 2 capsules by mouth at bedtime.   [DISCONTINUED] BEE POLLEN PO Take 500 mg by mouth 2 (two) times daily. 1 in the morning and 2 at night   trimethoprim-polymyxin b (POLYTRIM) ophthalmic solution 1 drop 4 (four) times daily. (Patient not taking: Reported on 01/19/2024)   No facility-administered encounter medications on  file as of 01/19/2024.    Past Medical History:  Diagnosis Date   Anxiety    Arrhythmia    palpitation   Arthritis    Breast cancer (HCC)    Depression    Family history of breast cancer    Family history of leukemia    Family history of prostate cancer    Family history of stomach cancer    Fibroid, uterine    Fibromyalgia    GERD (gastroesophageal reflux disease)    History of radiation therapy    Left breast- 04/15/21-05/30/21- Dr. Lynwood Nasuti   Hx of degenerative disc disease    Hypertension    Interstitial cystitis    Microadenoma    Migraines    PONV (postoperative nausea and  vomiting)    Pre-diabetes     Past Surgical History:  Procedure Laterality Date   BREAST LUMPECTOMY Left 03/12/2021   BREAST LUMPECTOMY WITH RADIOACTIVE SEED AND SENTINEL LYMPH NODE BIOPSY Left 03/12/2021   Procedure: LEFT BREAST LUMPECTOMY WITH RADIOACTIVE SEED X2 AND LEFT SENTINEL LYMPH NODE BIOPSY;  Surgeon: Belinda Cough, MD;  Location: [facility];  Service: General;  Laterality: Left;   CHOLECYSTECTOMY  05/2002   PORT-A-CATH REMOVAL Right 12/02/2021   Procedure: REMOVAL PORT-A-CATH;  Surgeon: Belinda Cough, MD;  Location: [facility];  Service: General;  Laterality: Right;   PORTACATH PLACEMENT Right 10/17/2020   Procedure: INSERTION PORT-A-CATH, ULTRASOUND GUIDED;  Surgeon: Belinda Cough, MD;  Location: [facility];  Service: General;  Laterality: Right;   RADIOACTIVE SEED GUIDED AXILLARY SENTINEL LYMPH NODE Left 03/12/2021   Procedure: RADIOACTIVE SEED GUIDED LEFT AXILLARY SENTINEL LYMPH NODE BIOPSY;  Surgeon: Belinda Cough, MD;  Location: [facility];  Service: General;  Laterality: Left;    Family History  Problem Relation Age of Onset   Breast cancer Mother 24   Cancer Mother    Hypertension Mother    Prostate cancer Father 33   Alcohol abuse Father    Depression Father    Fibromyalgia Sister    Depression Sister    Stroke Maternal Aunt    Diabetes Maternal Uncle    Breast cancer Paternal Aunt    Cancer Paternal Aunt    Diabetes Maternal Grandmother    Hypertension Maternal Grandmother    Stomach cancer Paternal Grandmother        dx older than 58   Leukemia Paternal Grandfather 4       dx older than 66   Breast cancer Other        mother's first cousin    Social History   Socioeconomic History   Marital status: Married    Spouse name: Not on file   Number of children: 0   Years of education: Not on file   Highest education level: Associate degree: occupational, Scientist, product/process development, or vocational program   Occupational History   Occupation: CNA  Tobacco Use   Smoking status: Never    Passive exposure: Never   Smokeless tobacco: Never  Vaping Use   Vaping status: Never Used  Substance and Sexual Activity   Alcohol use: Never   Drug use: Never   Sexual activity: Yes  Other Topics Concern   Not on file  Social History Narrative   Not on file   Social Drivers of Health   Financial Resource Strain: Low Risk  (01/18/2024)   Overall Financial Resource Strain (CARDIA)    Difficulty of Paying Living Expenses: Not very hard  Food  Insecurity: No Food Insecurity (01/18/2024)   Hunger Vital Sign    Worried About Running Out of Food in the Last Year: Never true    Ran Out of Food in the Last Year: Never true  Transportation Needs: No Transportation Needs (01/18/2024)   PRAPARE - Administrator, Civil Service (Medical): No    Lack of Transportation (Non-Medical): No  Physical Activity: Insufficiently Active (01/18/2024)   Exercise Vital Sign    Days of Exercise per Week: 3 days    Minutes of Exercise per Session: 30 min  Stress: Stress Concern Present (01/18/2024)   [employer] of Occupational Health - Occupational Stress Questionnaire    Feeling of Stress: To some extent  Social Connections: Moderately Integrated (01/18/2024)   Social Connection and Isolation Panel    Frequency of Communication with Friends and Family: Once a week    Frequency of Social Gatherings with Friends and Family: Twice a week    Attends Religious Services: More than 4 times per year    Active Member of [employer] or Organizations: No    Attends Engineer, structural: Not on file    Marital Status: Married  Intimate Partner Violence: Patient Unable To Answer (01/19/2024)   Humiliation, Afraid, Rape, and Kick questionnaire    Fear of Current or Ex-Partner: Patient unable to answer    Emotionally Abused: Patient unable to answer    Physically Abused: Patient unable to answer    Sexually Abused: Patient  unable to answer    ROS  Per HPI      Objective    BP 121/79   Pulse 76   Temp 98 F (36.7 C) (Oral)   Resp 16   Ht 5' 1.81 (1.57 m)   Wt 159 lb (72.1 kg)   LMP 06/15/2020 (Approximate)   SpO2 98%   BMI 29.26 kg/m   Physical Exam Constitutional:      General: She is not in acute distress.    Appearance: Normal appearance. She is not ill-appearing.  HENT:     Head: Normocephalic and atraumatic.     Nose: Nose normal.     Mouth/Throat:     Mouth: Mucous membranes are moist.     Pharynx: Oropharynx is clear.  Eyes:     General: No scleral icterus.    Extraocular Movements: Extraocular movements intact.     Conjunctiva/sclera: Conjunctivae normal.     Pupils: Pupils are equal, round, and reactive to light.  Neck:     Vascular: No carotid bruit.  Cardiovascular:     Rate and Rhythm: Normal rate and regular rhythm.     Pulses: Normal pulses.     Heart sounds: Normal heart sounds. No murmur heard.    No friction rub.  Pulmonary:     Effort: Pulmonary effort is normal. No respiratory distress.     Breath sounds: Normal breath sounds. No wheezing, rhonchi or rales.  Musculoskeletal:        General: Normal range of motion.     Cervical back: Neck supple.     Right lower leg: No edema.     Left lower leg: No edema.  Skin:    General: Skin is warm and dry.     Coloration: Skin is not jaundiced or pale.     Comments: Small area of induration with mild erythema and bruising on RLQ of abdomen- superficial lesion.  Neurological:     General: No focal deficit present.     Mental  Status: She is alert.  Psychiatric:        Mood and Affect: Mood normal.        Behavior: Behavior normal.     Last CBC Lab Results  Component Value Date   WBC 5.0 12/30/2021   HGB 12.6 12/30/2021   HCT 36.6 12/30/2021   MCV 87.4 12/30/2021   MCH 30.1 12/30/2021   RDW 13.9 12/30/2021   PLT 188 12/30/2021   Last metabolic panel Lab Results  Component Value Date   GLUCOSE 85  12/30/2021   NA 139 12/30/2021   K 3.7 12/30/2021   CL 104 12/30/2021   CO2 28 12/30/2021   BUN 25 (H) 12/30/2021   CREATININE 0.71 12/30/2021   GFRNONAA >60 12/30/2021   CALCIUM 9.9 12/30/2021   PROT 7.2 12/30/2021   ALBUMIN 4.5 12/30/2021   BILITOT 0.2 (L) 12/30/2021   ALKPHOS 65 12/30/2021   AST 21 12/30/2021   ALT 30 12/30/2021   ANIONGAP 7 12/30/2021   Last lipids No results found for: CHOL, HDL, LDLCALC, LDLDIRECT, TRIG, CHOLHDL Last hemoglobin A1c No results found for: HGBA1C      Assessment & Plan:   Assessment and Plan    Right abdominal cutaneous abscess Previously treated with clindamycin for 10 days. Self-expressed contents. Potential for recurrence if not properly drained. - Advise against further manipulation of the abscess. - Recommend consultation with [facility] for potential incision and drainage due to placement. - If dermatology appointment is delayed, consider performing incision and drainage in-office. - Monitor for signs of infection such as redness or swelling; if present, consider additional antibiotics.  Eczema Suspected due to rash around eyes and ears, exacerbated by stress. Rash is itchy but not painful. - Prescribe triamcinolone  cream 0.1% for use behind ears, not on eyes. - Recommend over-the-counter hydrocortisone  cream for use around eyes, avoiding direct contact with eyes. - Advise taking pictures of rash during flare-ups for future evaluation.  Estrogen receptor positive breast cancer Located in the left breast, lower outer quadrant. Managed with letrozole . Reports hot flashes and weight management challenges due to medication and menopause. - Continue letrozole  as prescribed. - Monitor weight and encourage weight management strategies. - Ensure regular follow-up with oncology for mammograms and cancer surveillance.  Hypertension Chronic, stable. Managed with metoprolol . - Continue metoprolol  as prescribed. -  Request medication refills through MyChart as needed.  Prediabetes Chronic, stable. No current symptoms indicating progression. - Order basic panel of labs to include A1c, cholesterol, anemia, kidney, liver, and electrolytes. - Schedule lab work at her convenience.  General Health Maintenance Up to date on Pap smear. No history of diabetes or high cholesterol. No smoking or vaping. No family history of colon cancer. Prefers Cologuard as initial colon cancer screening method. - Order Cologuard test to be sent to her home. - Ensure follow-up with OBGYN in October for routine care.      Return in about 4 weeks (around 02/16/2024), or if symptoms worsen or fail to improve, for abscess.   Mercy Leppla T Anthany Thornhill, PA-C

## 2024-01-20 ENCOUNTER — Other Ambulatory Visit (INDEPENDENT_AMBULATORY_CARE_PROVIDER_SITE_OTHER)

## 2024-01-20 DIAGNOSIS — L0291 Cutaneous abscess, unspecified: Secondary | ICD-10-CM

## 2024-01-20 DIAGNOSIS — Z170 Estrogen receptor positive status [ER+]: Secondary | ICD-10-CM

## 2024-01-20 DIAGNOSIS — Z13220 Encounter for screening for lipoid disorders: Secondary | ICD-10-CM

## 2024-01-20 DIAGNOSIS — Z136 Encounter for screening for cardiovascular disorders: Secondary | ICD-10-CM | POA: Diagnosis not present

## 2024-01-20 DIAGNOSIS — Z131 Encounter for screening for diabetes mellitus: Secondary | ICD-10-CM

## 2024-01-21 ENCOUNTER — Ambulatory Visit (HOSPITAL_BASED_OUTPATIENT_CLINIC_OR_DEPARTMENT_OTHER): Payer: Self-pay | Admitting: Student

## 2024-01-21 LAB — COMPREHENSIVE METABOLIC PANEL WITH GFR
ALT: 27 IU/L (ref 0–32)
AST: 28 IU/L (ref 0–40)
Albumin: 4.6 g/dL (ref 3.9–4.9)
Alkaline Phosphatase: 63 IU/L (ref 44–121)
BUN/Creatinine Ratio: 23 (ref 9–23)
BUN: 18 mg/dL (ref 6–24)
Bilirubin Total: 0.4 mg/dL (ref 0.0–1.2)
CO2: 20 mmol/L (ref 20–29)
Calcium: 9.7 mg/dL (ref 8.7–10.2)
Chloride: 103 mmol/L (ref 96–106)
Creatinine, Ser: 0.77 mg/dL (ref 0.57–1.00)
Globulin, Total: 2.5 g/dL (ref 1.5–4.5)
Glucose: 82 mg/dL (ref 70–99)
Potassium: 4.4 mmol/L (ref 3.5–5.2)
Sodium: 141 mmol/L (ref 134–144)
Total Protein: 7.1 g/dL (ref 6.0–8.5)
eGFR: 97 mL/min/1.73 (ref >59)

## 2024-01-21 LAB — CBC WITH DIFFERENTIAL/PLATELET
Basophils Absolute: 0 x10E3/uL (ref 0.0–0.2)
Basos: 1 %
EOS (ABSOLUTE): 0.1 x10E3/uL (ref 0.0–0.4)
Eos: 2 %
Hematocrit: 37.4 % (ref 34.0–46.6)
Hemoglobin: 12 g/dL (ref 11.1–15.9)
Immature Grans (Abs): 0 x10E3/uL (ref 0.0–0.1)
Immature Granulocytes: 0 %
Lymphocytes Absolute: 2.3 x10E3/uL (ref 0.7–3.1)
Lymphs: 42 %
MCH: 29.7 pg (ref 26.6–33.0)
MCHC: 32.1 g/dL (ref 31.5–35.7)
MCV: 93 fL (ref 79–97)
Monocytes Absolute: 0.6 x10E3/uL (ref 0.1–0.9)
Monocytes: 11 %
Neutrophils Absolute: 2.4 x10E3/uL (ref 1.4–7.0)
Neutrophils: 44 %
Platelets: 190 x10E3/uL (ref 150–450)
RBC: 4.04 x10E6/uL (ref 3.77–5.28)
RDW: 13.4 % (ref 11.7–15.4)
WBC: 5.4 x10E3/uL (ref 3.4–10.8)

## 2024-01-21 LAB — HEMOGLOBIN A1C
Est. average glucose Bld gHb Est-mCnc: 126 mg/dL
Hgb A1c MFr Bld: 6 % — ABNORMAL HIGH (ref 4.8–5.6)

## 2024-01-21 LAB — LIPID PANEL
Chol/HDL Ratio: 2.6 ratio (ref 0.0–4.4)
Cholesterol, Total: 197 mg/dL (ref 100–199)
HDL: 75 mg/dL (ref >39)
LDL Chol Calc (NIH): 108 mg/dL — ABNORMAL HIGH (ref 0–99)
Triglycerides: 79 mg/dL (ref 0–149)
VLDL Cholesterol Cal: 14 mg/dL (ref 5–40)

## 2024-01-31 ENCOUNTER — Telehealth: Payer: Self-pay

## 2024-01-31 ENCOUNTER — Encounter: Payer: Self-pay | Admitting: Hematology and Oncology

## 2024-01-31 NOTE — Telephone Encounter (Signed)
 Phone call to Tammie [name] with negative results of guardant reveal

## 2024-02-02 ENCOUNTER — Encounter: Payer: Self-pay | Admitting: Hematology and Oncology

## 2024-02-04 LAB — COLOGUARD: COLOGUARD: NEGATIVE

## 2024-02-18 ENCOUNTER — Ambulatory Visit (HOSPITAL_BASED_OUTPATIENT_CLINIC_OR_DEPARTMENT_OTHER): Admitting: Student

## 2024-02-21 ENCOUNTER — Encounter (HOSPITAL_BASED_OUTPATIENT_CLINIC_OR_DEPARTMENT_OTHER): Payer: Self-pay | Admitting: Student

## 2024-02-21 ENCOUNTER — Ambulatory Visit (HOSPITAL_BASED_OUTPATIENT_CLINIC_OR_DEPARTMENT_OTHER): Admitting: Student

## 2024-02-21 VITALS — BP 126/82 | HR 87 | Temp 98.3°F | Resp 16 | Ht 61.0 in | Wt 161.2 lb

## 2024-02-21 DIAGNOSIS — M5441 Lumbago with sciatica, right side: Secondary | ICD-10-CM | POA: Diagnosis not present

## 2024-02-21 DIAGNOSIS — L0291 Cutaneous abscess, unspecified: Secondary | ICD-10-CM

## 2024-02-21 DIAGNOSIS — D352 Benign neoplasm of pituitary gland: Secondary | ICD-10-CM | POA: Diagnosis not present

## 2024-02-21 DIAGNOSIS — Z23 Encounter for immunization: Secondary | ICD-10-CM | POA: Diagnosis not present

## 2024-02-21 DIAGNOSIS — R7303 Prediabetes: Secondary | ICD-10-CM | POA: Insufficient documentation

## 2024-02-21 DIAGNOSIS — G43109 Migraine with aura, not intractable, without status migrainosus: Secondary | ICD-10-CM | POA: Insufficient documentation

## 2024-02-21 DIAGNOSIS — G8929 Other chronic pain: Secondary | ICD-10-CM | POA: Insufficient documentation

## 2024-02-21 DIAGNOSIS — L2082 Flexural eczema: Secondary | ICD-10-CM

## 2024-02-21 MED ORDER — SUMATRIPTAN SUCCINATE 50 MG PO TABS: 50.0000 mg | ORAL_TABLET | ORAL | 6 refills | Status: AC | PRN

## 2024-02-21 MED ORDER — IBUPROFEN 800 MG PO TABS: 800.0000 mg | ORAL_TABLET | Freq: Two times a day (BID) | ORAL | 2 refills | Status: AC | PRN

## 2024-02-21 NOTE — Progress Notes (Signed)
 Established Patient Office Visit  Subjective   Patient ID: Tammie [name], female    DOB: May 07, 1976  Age: 48 y.o. MRN: [id number]  Chief Complaint  Patient presents with   Medical Management of Chronic Issues    follow up     HPI  Discussed the use of AI scribe software for clinical note transcription with the patient, who gave verbal consent to proceed.  History of Present Illness   Tammie [name] is a 48 year old female who presents for follow-up on an abdominal abscess.  She is following up on an abdominal abscess, which she reports has not been manipulated. She suspects it may have started as an ingrown hair. She has an upcoming appointment with dermatology for further management. She mentions a similar lesion on her back, which she believes is different and has only been infected once.  She has a history of eczema, primarily affecting her hands, exacerbated by frequent cleaning. She uses triamcinolone  cream with good effect and is attempting to moisturize regularly. She reports dryness in her ears but no eczema there. She uses natural shampoo and conditioner, which she feels helps with her symptoms.  She experiences chronic low back pain that occasionally radiates down her right leg. She has also had neck pain with associated arm discomfort. She has previously tried physical therapy and visits a chiropractor monthly, which she finds helpful. She uses ibuprofen  as needed for pain management.  She suffers from migraines, typically on the right side of her head, with symptoms including throbbing pain, sensitivity to light and sound, nausea, and visual aura. She experiences these migraines up to twice a week. She notes a correlation between muscle spasms in her neck and the onset of migraines. She has a history of a pituitary microadenoma, diagnosed over twenty years ago, with the last imaging done seven years ago. Her prolactin levels have been monitored and remain normal.  Her  cholesterol levels have been reported as acceptable.        Patient Active Problem List   Diagnosis Date Noted   Migraine with aura and without status migrainosus, not intractable 02/21/2024   Chronic right-sided low back pain with right-sided sciatica 02/21/2024   Pituitary microadenoma (HCC) 02/21/2024   Abscess 01/19/2024   Flexural eczema 01/19/2024   Port-A-Cath in place 12/20/2020   Genetic testing 10/23/2020   Family history of breast cancer    Family history of prostate cancer    Family history of stomach cancer    Family history of leukemia    Malignant neoplasm of lower-outer quadrant of left breast of female, estrogen receptor positive (HCC) 10/03/2020   Past Medical History:  Diagnosis Date   Anxiety    Arrhythmia    palpitation   Arthritis    Breast cancer (HCC)    Depression    Family history of breast cancer    Family history of leukemia    Family history of prostate cancer    Family history of stomach cancer    Fibroid, uterine    Fibromyalgia    GERD (gastroesophageal reflux disease)    History of radiation therapy    Left breast- 04/15/21-05/30/21- Dr. Lynwood Nasuti   Hx of degenerative disc disease    Hypertension    Interstitial cystitis    Microadenoma    Migraines    PONV (postoperative nausea and vomiting)    Pre-diabetes    Social History   Tobacco Use   Smoking status: Never  Passive exposure: Never   Smokeless tobacco: Never  Vaping Use   Vaping status: Never Used  Substance Use Topics   Alcohol use: Never   Drug use: Never   No Known Allergies    ROS Per HPI.    Objective:     BP 126/82   Pulse 87   Temp 98.3 F (36.8 C) (Oral)   Resp 16   Ht 5' 1 (1.549 m)   Wt 161 lb 3.2 oz (73.1 kg)   LMP 06/15/2020 (Approximate)   SpO2 98%   BMI 30.46 kg/m  BP Readings from Last 3 Encounters:  02/21/24 126/82  01/19/24 121/79  01/04/24 129/60   Wt Readings from Last 3 Encounters:  02/21/24 161 lb 3.2 oz (73.1 kg)   01/19/24 159 lb (72.1 kg)  01/04/24 159 lb 1.6 oz (72.2 kg)      Physical Exam Constitutional:      General: She is not in acute distress.    Appearance: Normal appearance. She is not ill-appearing.  HENT:     Head: Normocephalic and atraumatic.     Nose: Nose normal.  Eyes:     General: No scleral icterus.    Conjunctiva/sclera: Conjunctivae normal.  Cardiovascular:     Rate and Rhythm: Normal rate and regular rhythm.     Heart sounds: Normal heart sounds. No murmur heard.    No friction rub.  Pulmonary:     Effort: Pulmonary effort is normal. No respiratory distress.     Breath sounds: Normal breath sounds. No wheezing, rhonchi or rales.  Musculoskeletal:        General: Normal range of motion.  Skin:    General: Skin is warm and dry.     Coloration: Skin is not jaundiced or pale.     Comments: Small abscesses on (1) R lower abdomen and on (1) mid thoracic back. No signs of infection.  Neurological:     General: No focal deficit present.     Mental Status: She is alert.  Psychiatric:        Mood and Affect: Mood normal.        Behavior: Behavior normal.      No results found for any visits on 02/21/24.  Last CBC Lab Results  Component Value Date   WBC 5.4 01/20/2024   HGB 12.0 01/20/2024   HCT 37.4 01/20/2024   MCV 93 01/20/2024   MCH 29.7 01/20/2024   RDW 13.4 01/20/2024   PLT 190 01/20/2024   Last metabolic panel Lab Results  Component Value Date   GLUCOSE 82 01/20/2024   NA 141 01/20/2024   K 4.4 01/20/2024   CL 103 01/20/2024   CO2 20 01/20/2024   BUN 18 01/20/2024   CREATININE 0.77 01/20/2024   EGFR 97 01/20/2024   CALCIUM 9.7 01/20/2024   PROT 7.1 01/20/2024   ALBUMIN 4.6 01/20/2024   LABGLOB 2.5 01/20/2024   BILITOT 0.4 01/20/2024   ALKPHOS 63 01/20/2024   AST 28 01/20/2024   ALT 27 01/20/2024   ANIONGAP 7 12/30/2021   Last lipids Lab Results  Component Value Date   CHOL 197 01/20/2024   HDL 75 01/20/2024   LDLCALC 108 (H)  01/20/2024   TRIG 79 01/20/2024   CHOLHDL 2.6 01/20/2024   Last hemoglobin A1c Lab Results  Component Value Date   HGBA1C 6.0 (H) 01/20/2024      The 10-year ASCVD risk score (Arnett DK, et al., 2019) is: 0.6%    Assessment & Plan:  Assessment and Plan    Abdominal and back cutaneous abscesses The abdominal abscess appears less inflamed and may have originated from an ingrown hair. A similar lesion on the back, possibly another ingrown hair, may also require dermatological evaluation. Dermatology involvement is preferred for cosmetic reasons. - Proceed with dermatology appointment for incision and drainage of abdominal abscess. - Discuss potential removal of back lesion with dermatology.  Migraine with aura Chronic, continued exacerbations (not happening today). Migraine with aura occurring up to twice a week, primarily on the right side with throbbing pain, photophobia, phonophobia, and nausea. Potential triggers include stress and environmental factors. Visual aura accompanies migraines. - Prescribe sumatriptan  for acute migraine management. - Instruct to take sumatriptan  at the first sign of migraine, with a second dose if no relief in two hours. - Limit sumatriptan  use to no more than 15 doses per month. - Follow up via message or in 4-6 weeks to assess efficacy of sumatriptan .  Eczema Eczema is present, particularly affecting the hands due to frequent cleaning. Triamcinolone  has been effective in managing symptoms. - Continue triamcinolone  for eczema management. - Encourage regular moisturizing. - Consider using Head & Shoulders or Selsun Blue for scalp.  Chronic low back pain with right lower extremity radiculopathy and cervical radiculopathy Chronic low back pain with occasional right lower extremity radiculopathy. Cervical radiculopathy also noted. Pain is not currently present, and physical therapy is not deemed necessary at this time. Chiropractic care and massages  provide relief.  Pituitary microadenoma Pituitary microadenoma present for over 20 years, last evaluated 7 years ago. Prolactin levels are normal. An updated MRI is warranted to assess for any changes. If changes are noted, a referral to neurosurgery will be considered. - Order MRI of the brain to evaluate pituitary microadenoma. - Consider referral to neurosurgery based on MRI findings.  Prediabetes Prediabetes confirmed. Regular monitoring of A1c is necessary to prevent progression to diabetes. - Recheck A1c in six months.  General Health Maintenance Tdap vaccination is due and will be administered during the visit. - Administer Tdap vaccine.      Return in about 6 months (around 08/20/2024) for Chronic Followup.    Cerina Leary T Tenisha Fleece, PA-C

## 2024-02-21 NOTE — Patient Instructions (Signed)
 It was nice to see you today!  As we discussed in clinic:  Try head and shoulders or selsun blue for your head.  If you have any problems before your next visit feel free to message me via MyChart (minor issues or questions) or call the office, otherwise you may reach out to schedule an office visit.  Thank you! Nikolaus Pienta, PA-C

## 2024-02-25 ENCOUNTER — Ambulatory Visit
Admission: RE | Admit: 2024-02-25 | Discharge: 2024-02-25 | Disposition: A | Discharge status: Home / Self Care | Source: Ambulatory Visit | Attending: Hematology and Oncology

## 2024-02-25 DIAGNOSIS — Z1231 Encounter for screening mammogram for malignant neoplasm of breast: Secondary | ICD-10-CM

## 2024-02-25 HISTORY — DX: Personal history of irradiation: Z92.3

## 2024-02-25 HISTORY — DX: Personal history of antineoplastic chemotherapy: Z92.21

## 2024-03-01 ENCOUNTER — Encounter (HOSPITAL_BASED_OUTPATIENT_CLINIC_OR_DEPARTMENT_OTHER): Payer: Self-pay

## 2024-03-15 ENCOUNTER — Other Ambulatory Visit (HOSPITAL_BASED_OUTPATIENT_CLINIC_OR_DEPARTMENT_OTHER): Admitting: Radiology

## 2024-03-16 ENCOUNTER — Ambulatory Visit (INDEPENDENT_AMBULATORY_CARE_PROVIDER_SITE_OTHER)
Admission: RE | Admit: 2024-03-16 | Discharge: 2024-03-16 | Disposition: A | Discharge status: Home / Self Care | Source: Ambulatory Visit | Attending: Student | Admitting: Student

## 2024-03-16 ENCOUNTER — Encounter (HOSPITAL_BASED_OUTPATIENT_CLINIC_OR_DEPARTMENT_OTHER): Payer: Self-pay

## 2024-03-16 DIAGNOSIS — D352 Benign neoplasm of pituitary gland: Secondary | ICD-10-CM

## 2024-03-16 DIAGNOSIS — G43109 Migraine with aura, not intractable, without status migrainosus: Secondary | ICD-10-CM | POA: Diagnosis not present

## 2024-03-16 MED ORDER — GADOBUTROL 1 MMOL/ML IV SOLN
7.3000 mL | Freq: Once | INTRAVENOUS | Status: AC | PRN
  Administered 2024-03-16: 7.3 mL via INTRAVENOUS

## 2024-03-23 ENCOUNTER — Ambulatory Visit (HOSPITAL_BASED_OUTPATIENT_CLINIC_OR_DEPARTMENT_OTHER): Payer: Self-pay | Admitting: Student

## 2024-03-23 NOTE — Progress Notes (Signed)
 Called patient: normal results on MRI. Discussed with neurosurg, no concern that would require any follow up imaging.

## 2024-03-27 ENCOUNTER — Ambulatory Visit

## 2024-04-10 ENCOUNTER — Other Ambulatory Visit (HOSPITAL_BASED_OUTPATIENT_CLINIC_OR_DEPARTMENT_OTHER): Payer: Self-pay | Admitting: Student

## 2024-04-10 MED ORDER — METOPROLOL TARTRATE 25 MG PO TABS: 25.0000 mg | ORAL_TABLET | Freq: Every day | ORAL | 3 refills | Status: AC

## 2024-04-10 NOTE — Telephone Encounter (Signed)
 Copied from CRM [id number]. Topic: Clinical - Medication Refill >> Apr 10, 2024  2:17 PM Gustabo D wrote: Medication: metoprolol  tartrate (LOPRESSOR ) 25 MG tablet  Has the patient contacted their pharmacy? Yes, needs new prescription (Agent: If no, request that the patient contact the pharmacy for the refill. If patient does not wish to contact the pharmacy document the reason why and proceed with request.) (Agent: If yes, when and what did the pharmacy advise?)  This is the patient's preferred pharmacy:  [id number] [place], [place] - [address] AT [facility] [address] [place] [address] Phone: [phone] Fax: [phone]  Is this the correct pharmacy for this prescription? Yes If no, delete pharmacy and type the correct one.   Has the prescription been filled recently? No  Is the patient out of the medication? Yes  Has the patient been seen for an appointment in the last year OR does the patient have an upcoming appointment? Yes  Can we respond through MyChart? Yes  Agent: Please be advised that Rx refills may take up to 3 business days. We ask that you follow-up with your pharmacy.

## 2024-05-08 ENCOUNTER — Ambulatory Visit: Attending: Surgery

## 2024-05-08 VITALS — Wt 159.0 lb

## 2024-05-08 DIAGNOSIS — Z483 Aftercare following surgery for neoplasm: Secondary | ICD-10-CM | POA: Insufficient documentation

## 2024-05-08 NOTE — Therapy (Signed)
 OUTPATIENT PHYSICAL THERAPY SOZO SCREENING NOTE   Patient Name: Tammie [name] MRN: [id number] DOB:Oct 09, 1975, 48 y.o., female Today's Date: 05/08/2024  PCP: Iven Lang ONEIDA DEVONNA REFERRING PROVIDER: Belinda Cough, MD   PT End of Session - 05/08/24 1641     Visit Number 2   # unchanged due to screen only   PT Start Time 1640    PT Stop Time 1644    PT Time Calculation (min) 4 min    Activity Tolerance Patient tolerated treatment well    Behavior During Therapy [facility] for tasks assessed/performed          Past Medical History:  Diagnosis Date   Anxiety    Arrhythmia    palpitation   Arthritis    Breast cancer (HCC) 03/12/2021   Depression    Family history of breast cancer    Family history of leukemia    Family history of prostate cancer    Family history of stomach cancer    Fibroid, uterine    Fibromyalgia    GERD (gastroesophageal reflux disease)    History of radiation therapy    Left breast- 04/15/21-05/30/21- Dr. Lynwood Nasuti   Hx of degenerative disc disease    Hypertension    Interstitial cystitis    Microadenoma    Migraines    Personal history of chemotherapy    Personal history of radiation therapy    PONV (postoperative nausea and vomiting)    Pre-diabetes    Past Surgical History:  Procedure Laterality Date   BREAST LUMPECTOMY Left 03/12/2021   BREAST LUMPECTOMY WITH RADIOACTIVE SEED AND SENTINEL LYMPH NODE BIOPSY Left 03/12/2021   Procedure: LEFT BREAST LUMPECTOMY WITH RADIOACTIVE SEED X2 AND LEFT SENTINEL LYMPH NODE BIOPSY;  Surgeon: Belinda Cough, MD;  Location: [facility];  Service: General;  Laterality: Left;   CHOLECYSTECTOMY  05/2002   PORT-A-CATH REMOVAL Right 12/02/2021   Procedure: REMOVAL PORT-A-CATH;  Surgeon: Belinda Cough, MD;  Location: [facility];  Service: General;  Laterality: Right;   PORTACATH PLACEMENT Right 10/17/2020   Procedure: INSERTION PORT-A-CATH, ULTRASOUND GUIDED;  Surgeon: Belinda Cough, MD;  Location: [facility];  Service: General;  Laterality: Right;   RADIOACTIVE SEED GUIDED AXILLARY SENTINEL LYMPH NODE Left 03/12/2021   Procedure: RADIOACTIVE SEED GUIDED LEFT AXILLARY SENTINEL LYMPH NODE BIOPSY;  Surgeon: Belinda Cough, MD;  Location: [facility];  Service: General;  Laterality: Left;   Patient Active Problem List   Diagnosis Date Noted   Migraine with aura and without status migrainosus, not intractable 02/21/2024   Chronic right-sided low back pain with right-sided sciatica 02/21/2024   Pituitary microadenoma (HCC) 02/21/2024   Prediabetes 02/21/2024   Abscess 01/19/2024   Flexural eczema 01/19/2024   Port-A-Cath in place 12/20/2020   Genetic testing 10/23/2020   Family history of breast cancer    Family history of prostate cancer    Family history of stomach cancer    Family history of leukemia    Malignant neoplasm of lower-outer quadrant of left breast of female, estrogen receptor positive (HCC) 10/03/2020    REFERRING DIAG: left breast cancer at risk for lymphedema  THERAPY DIAG: Aftercare following surgery for neoplasm  PERTINENT HISTORY:  Patient was diagnosed on 08/20/2020 with 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 It is ER/PR weakly positive and HER2 positive with a  Ki67 of 20%.            PRECAUTIONS: left UE Lymphedema risk  SUBJECTIVE: Pt returns for her first 6 month L-Dex screen.   PAIN:  Are you having pain? No  SOZO SCREENING: Patient was assessed today using the SOZO machine to determine the lymphedema index score. This was compared to her baseline score. It was determined that she is within the recommended range when compared to her baseline and no further action is needed at this time. She will continue SOZO screenings. These  are done every 3 months for 2 years post operatively followed by every 6 months for 2 years, and then annually.   L-DEX FLOWSHEETS - 05/08/24 1600       L-DEX LYMPHEDEMA SCREENING   Measurement Type Unilateral    L-DEX MEASUREMENT EXTREMITY Upper Extremity    POSITION  Standing    DOMINANT SIDE Right    At Risk Side Left    BASELINE SCORE (UNILATERAL) -1.1    L-DEX SCORE (UNILATERAL) -0.6    VALUE CHANGE (UNILAT) 0.5          P: Cont every 6 month L-Dex screens until 02/2025.    Berwyn Knights, PTA 05/08/24 4:43 PM

## 2024-06-23 ENCOUNTER — Encounter: Payer: Self-pay | Admitting: Hematology and Oncology

## 2024-07-13 ENCOUNTER — Telehealth: Payer: Self-pay | Admitting: *Deleted

## 2024-07-13 MED ORDER — LETROZOLE 2.5 MG PO TABS: 2.5000 mg | ORAL_TABLET | Freq: Every day | ORAL | 3 refills | Status: AC

## 2024-07-13 NOTE — Telephone Encounter (Signed)
 Refill for letrozole  sent to pt pharmacy on file per pt request.

## 2024-08-18 ENCOUNTER — Ambulatory Visit (HOSPITAL_BASED_OUTPATIENT_CLINIC_OR_DEPARTMENT_OTHER): Admitting: Family Medicine

## 2024-10-23 ENCOUNTER — Ambulatory Visit

## 2025-01-04 ENCOUNTER — Ambulatory Visit: Admitting: Hematology and Oncology
